# Patient Record
Sex: Female | Born: 1960
Health system: Southern US, Community
[De-identification: ages and names within clinical notes are randomized; demographics above are authoritative.]

## PROBLEM LIST (undated history)

## (undated) DIAGNOSIS — E039 Hypothyroidism, unspecified: Secondary | ICD-10-CM

## (undated) DIAGNOSIS — R112 Nausea with vomiting, unspecified: Secondary | ICD-10-CM

## (undated) DIAGNOSIS — Z9889 Other specified postprocedural states: Secondary | ICD-10-CM

## (undated) DIAGNOSIS — F419 Anxiety disorder, unspecified: Secondary | ICD-10-CM

## (undated) DIAGNOSIS — T4145XA Adverse effect of unspecified anesthetic, initial encounter: Secondary | ICD-10-CM

## (undated) DIAGNOSIS — I10 Essential (primary) hypertension: Secondary | ICD-10-CM

## (undated) DIAGNOSIS — T7840XA Allergy, unspecified, initial encounter: Secondary | ICD-10-CM

## (undated) DIAGNOSIS — F329 Major depressive disorder, single episode, unspecified: Secondary | ICD-10-CM

## (undated) DIAGNOSIS — M1990 Unspecified osteoarthritis, unspecified site: Secondary | ICD-10-CM

## (undated) DIAGNOSIS — Z8489 Family history of other specified conditions: Secondary | ICD-10-CM

## (undated) DIAGNOSIS — G4730 Sleep apnea, unspecified: Secondary | ICD-10-CM

## (undated) DIAGNOSIS — T8859XA Other complications of anesthesia, initial encounter: Secondary | ICD-10-CM

## (undated) DIAGNOSIS — F32A Depression, unspecified: Secondary | ICD-10-CM

## (undated) HISTORY — DX: Essential (primary) hypertension: I10

## (undated) HISTORY — DX: Sleep apnea, unspecified: G47.30

## (undated) HISTORY — DX: Allergy, unspecified, initial encounter: T78.40XA

## (undated) HISTORY — PX: VARICOSE VEIN SURGERY: SHX832

## (undated) HISTORY — PX: CHOLECYSTECTOMY: SHX55

## (undated) HISTORY — PX: OTHER SURGICAL HISTORY: SHX169

## (undated) HISTORY — PX: HERNIA REPAIR: SHX51

---

## 1997-07-07 ENCOUNTER — Ambulatory Visit: Admission: RE | Admit: 1997-07-07 | Discharge: 1997-07-07 | Payer: Self-pay | Admitting: Family Medicine

## 2000-05-19 ENCOUNTER — Encounter: Payer: Self-pay | Admitting: Family Medicine

## 2000-05-19 ENCOUNTER — Ambulatory Visit (HOSPITAL_COMMUNITY): Admission: RE | Admit: 2000-05-19 | Discharge: 2000-05-19 | Payer: Self-pay | Admitting: Family Medicine

## 2000-06-25 ENCOUNTER — Other Ambulatory Visit: Admission: RE | Admit: 2000-06-25 | Discharge: 2000-06-25 | Payer: Self-pay | Admitting: Obstetrics and Gynecology

## 2000-06-26 ENCOUNTER — Ambulatory Visit (HOSPITAL_COMMUNITY): Admission: RE | Admit: 2000-06-26 | Discharge: 2000-06-26 | Payer: Self-pay | Admitting: Obstetrics and Gynecology

## 2000-06-26 ENCOUNTER — Encounter: Payer: Self-pay | Admitting: Obstetrics and Gynecology

## 2000-09-16 ENCOUNTER — Encounter: Payer: Self-pay | Admitting: Obstetrics and Gynecology

## 2000-09-16 ENCOUNTER — Ambulatory Visit (HOSPITAL_COMMUNITY): Admission: RE | Admit: 2000-09-16 | Discharge: 2000-09-16 | Payer: Self-pay | Admitting: Obstetrics and Gynecology

## 2001-05-18 ENCOUNTER — Other Ambulatory Visit: Admission: RE | Admit: 2001-05-18 | Discharge: 2001-05-18 | Payer: Self-pay | Admitting: Obstetrics & Gynecology

## 2001-06-17 ENCOUNTER — Ambulatory Visit (HOSPITAL_COMMUNITY): Admission: RE | Admit: 2001-06-17 | Discharge: 2001-06-17 | Payer: Self-pay | Admitting: Obstetrics & Gynecology

## 2002-07-01 ENCOUNTER — Encounter: Payer: Self-pay | Admitting: Family Medicine

## 2002-07-01 ENCOUNTER — Ambulatory Visit (HOSPITAL_COMMUNITY): Admission: RE | Admit: 2002-07-01 | Discharge: 2002-07-01 | Payer: Self-pay | Admitting: Family Medicine

## 2003-09-30 ENCOUNTER — Emergency Department (HOSPITAL_COMMUNITY): Admission: EM | Admit: 2003-09-30 | Discharge: 2003-10-01 | Payer: Self-pay | Admitting: Emergency Medicine

## 2005-05-06 ENCOUNTER — Ambulatory Visit (HOSPITAL_COMMUNITY): Admission: RE | Admit: 2005-05-06 | Discharge: 2005-05-06 | Payer: Self-pay | Admitting: Family Medicine

## 2006-11-10 ENCOUNTER — Ambulatory Visit (HOSPITAL_COMMUNITY): Admission: RE | Admit: 2006-11-10 | Discharge: 2006-11-10 | Payer: Self-pay | Admitting: Family Medicine

## 2006-12-14 ENCOUNTER — Ambulatory Visit (HOSPITAL_COMMUNITY): Admission: RE | Admit: 2006-12-14 | Discharge: 2006-12-14 | Payer: Self-pay | Admitting: Family Medicine

## 2006-12-21 ENCOUNTER — Ambulatory Visit (HOSPITAL_COMMUNITY): Admission: RE | Admit: 2006-12-21 | Discharge: 2006-12-21 | Payer: Self-pay | Admitting: Family Medicine

## 2007-11-04 ENCOUNTER — Ambulatory Visit (HOSPITAL_COMMUNITY): Admission: RE | Admit: 2007-11-04 | Discharge: 2007-11-04 | Payer: Self-pay | Admitting: Family Medicine

## 2008-02-22 ENCOUNTER — Ambulatory Visit: Payer: Self-pay | Admitting: Cardiology

## 2008-02-22 ENCOUNTER — Emergency Department (HOSPITAL_COMMUNITY): Admission: EM | Admit: 2008-02-22 | Discharge: 2008-02-22 | Payer: Self-pay | Admitting: Emergency Medicine

## 2008-02-22 ENCOUNTER — Inpatient Hospital Stay (HOSPITAL_COMMUNITY): Admission: EM | Admit: 2008-02-22 | Discharge: 2008-02-24 | Payer: Self-pay | Admitting: Emergency Medicine

## 2008-02-23 ENCOUNTER — Ambulatory Visit: Payer: Self-pay | Admitting: Cardiology

## 2008-09-19 ENCOUNTER — Ambulatory Visit (HOSPITAL_COMMUNITY): Admission: RE | Admit: 2008-09-19 | Discharge: 2008-09-19 | Payer: Self-pay | Admitting: Family Medicine

## 2008-12-11 ENCOUNTER — Ambulatory Visit: Payer: Self-pay | Admitting: Orthopedic Surgery

## 2008-12-11 DIAGNOSIS — M7150 Other bursitis, not elsewhere classified, unspecified site: Secondary | ICD-10-CM | POA: Insufficient documentation

## 2009-01-03 ENCOUNTER — Encounter: Payer: Self-pay | Admitting: Orthopedic Surgery

## 2009-08-11 ENCOUNTER — Emergency Department (HOSPITAL_COMMUNITY): Admission: EM | Admit: 2009-08-11 | Discharge: 2009-08-11 | Payer: Self-pay | Admitting: Emergency Medicine

## 2010-04-18 ENCOUNTER — Ambulatory Visit (HOSPITAL_COMMUNITY): Payer: Self-pay | Admitting: Physical Therapy

## 2010-04-18 ENCOUNTER — Ambulatory Visit (HOSPITAL_COMMUNITY)
Admission: RE | Admit: 2010-04-18 | Discharge: 2010-04-18 | Disposition: A | Discharge status: Home / Self Care | Payer: 59 | Source: Ambulatory Visit | Attending: Family Medicine | Admitting: Family Medicine

## 2010-04-18 ENCOUNTER — Other Ambulatory Visit (HOSPITAL_COMMUNITY): Payer: Self-pay | Admitting: Family Medicine

## 2010-04-18 DIAGNOSIS — R0602 Shortness of breath: Secondary | ICD-10-CM | POA: Insufficient documentation

## 2010-04-21 LAB — CBC
HCT: 45.7 % (ref 36.0–46.0)
Hemoglobin: 15.6 g/dL — ABNORMAL HIGH (ref 12.0–15.0)
MCH: 29.5 pg (ref 26.0–34.0)
Platelets: 193 10*3/uL (ref 150–400)
RBC: 5.29 MIL/uL — ABNORMAL HIGH (ref 3.87–5.11)
RDW: 14.2 % (ref 11.5–15.5)
WBC: 9.6 10*3/uL (ref 4.0–10.5)

## 2010-04-21 LAB — BASIC METABOLIC PANEL
Calcium: 9.3 mg/dL (ref 8.4–10.5)
Creatinine, Ser: 0.67 mg/dL (ref 0.4–1.2)
GFR calc Af Amer: >60 mL/min (ref >60)
Potassium: 3.3 mEq/L — ABNORMAL LOW (ref 3.5–5.1)
Sodium: 140 mEq/L (ref 135–145)

## 2010-04-21 LAB — POCT CARDIAC MARKERS: CKMB, poc: <1 ng/mL — ABNORMAL LOW (ref 1.0–8.0)

## 2010-04-21 LAB — DIFFERENTIAL
Eosinophils Relative: 2 % (ref 0–5)
Neutro Abs: 5.1 10*3/uL (ref 1.7–7.7)
Neutrophils Relative %: 53 % (ref 43–77)

## 2010-04-22 ENCOUNTER — Ambulatory Visit (HOSPITAL_COMMUNITY): Payer: Self-pay | Admitting: Physical Therapy

## 2010-05-20 LAB — RAPID URINE DRUG SCREEN, HOSP PERFORMED
Amphetamines: NOT DETECTED
Barbiturates: NOT DETECTED
Benzodiazepines: POSITIVE — AB
Cocaine: NOT DETECTED
Opiates: NOT DETECTED

## 2010-05-20 LAB — COMPREHENSIVE METABOLIC PANEL
ALT: 25 U/L (ref 0–35)
AST: 19 U/L (ref 0–37)
BUN: 11 mg/dL (ref 6–23)
Calcium: 8.5 mg/dL (ref 8.4–10.5)
Calcium: 8.9 mg/dL (ref 8.4–10.5)
GFR calc Af Amer: >60 mL/min (ref >60)
Glucose, Bld: 102 mg/dL — ABNORMAL HIGH (ref 70–99)
Glucose, Bld: 124 mg/dL — ABNORMAL HIGH (ref 70–99)
Sodium: 140 mEq/L (ref 135–145)
Sodium: 142 mEq/L (ref 135–145)
Total Protein: 6 g/dL (ref 6.0–8.3)
Total Protein: 6.1 g/dL (ref 6.0–8.3)

## 2010-05-20 LAB — DIFFERENTIAL
Basophils Absolute: 0.1 10*3/uL (ref 0.0–0.1)
Eosinophils Absolute: 0.1 10*3/uL (ref 0.0–0.7)
Lymphocytes Relative: 36 % (ref 12–46)
Lymphs Abs: 2.4 10*3/uL (ref 0.7–4.0)
Lymphs Abs: 2.9 10*3/uL (ref 0.7–4.0)
Monocytes Relative: 7 % (ref 3–12)
Neutro Abs: 4.3 10*3/uL (ref 1.7–7.7)
Neutrophils Relative %: 59 % (ref 43–77)

## 2010-05-20 LAB — CBC
Hemoglobin: 14.6 g/dL (ref 12.0–15.0)
MCHC: 33 g/dL (ref 30.0–36.0)
Platelets: 171 10*3/uL (ref 150–400)
Platelets: 191 10*3/uL (ref 150–400)
RDW: 14.5 % (ref 11.5–15.5)
WBC: 7.9 10*3/uL (ref 4.0–10.5)

## 2010-05-20 LAB — PROTIME-INR: INR: 1.1 (ref 0.00–1.49)

## 2010-05-20 LAB — LIPID PANEL
Cholesterol: 160 mg/dL (ref 0–200)
Total CHOL/HDL Ratio: 6.4 RATIO
VLDL: 30 mg/dL (ref 0–40)

## 2010-05-20 LAB — GLUCOSE, CAPILLARY: Glucose-Capillary: 116 mg/dL — ABNORMAL HIGH (ref 70–99)

## 2010-05-20 LAB — HEMOGLOBIN A1C: Mean Plasma Glucose: 117 mg/dL

## 2010-05-20 LAB — POCT CARDIAC MARKERS
CKMB, poc: <1 ng/mL — ABNORMAL LOW (ref 1.0–8.0)
Myoglobin, poc: 62 ng/mL (ref 12–200)
Troponin i, poc: <0.05 ng/mL (ref 0.00–0.09)
Troponin i, poc: <0.05 ng/mL (ref 0.00–0.09)

## 2010-05-20 LAB — CARDIAC PANEL(CRET KIN+CKTOT+MB+TROPI)
Relative Index: INVALID (ref 0.0–2.5)
Total CK: 53 U/L (ref 7–177)

## 2010-05-20 LAB — APTT: aPTT: 32 seconds (ref 24–37)

## 2010-05-20 LAB — BASIC METABOLIC PANEL
BUN: 14 mg/dL (ref 6–23)
CO2: 23 mEq/L (ref 19–32)
Chloride: 109 mEq/L (ref 96–112)
Creatinine, Ser: 0.86 mg/dL (ref 0.4–1.2)
GFR calc Af Amer: >60 mL/min (ref >60)
Sodium: 141 mEq/L (ref 135–145)

## 2010-05-20 LAB — BRAIN NATRIURETIC PEPTIDE: Pro B Natriuretic peptide (BNP): <30 pg/mL (ref 0.0–100.0)

## 2010-06-07 ENCOUNTER — Emergency Department (HOSPITAL_COMMUNITY)
Admission: EM | Admit: 2010-06-07 | Discharge: 2010-06-07 | Disposition: A | Discharge status: Home / Self Care | Payer: 59 | Attending: Emergency Medicine | Admitting: Emergency Medicine

## 2010-06-07 ENCOUNTER — Ambulatory Visit (HOSPITAL_COMMUNITY)
Admission: RE | Admit: 2010-06-07 | Discharge: 2010-06-07 | Disposition: A | Discharge status: Home / Self Care | Payer: 59 | Source: Ambulatory Visit | Attending: Family Medicine | Admitting: Family Medicine

## 2010-06-07 ENCOUNTER — Other Ambulatory Visit: Payer: Self-pay | Admitting: Family Medicine

## 2010-06-07 DIAGNOSIS — K5730 Diverticulosis of large intestine without perforation or abscess without bleeding: Secondary | ICD-10-CM | POA: Insufficient documentation

## 2010-06-07 DIAGNOSIS — K921 Melena: Secondary | ICD-10-CM | POA: Insufficient documentation

## 2010-06-07 DIAGNOSIS — R109 Unspecified abdominal pain: Secondary | ICD-10-CM | POA: Insufficient documentation

## 2010-06-07 DIAGNOSIS — K625 Hemorrhage of anus and rectum: Secondary | ICD-10-CM | POA: Insufficient documentation

## 2010-06-07 DIAGNOSIS — K7689 Other specified diseases of liver: Secondary | ICD-10-CM | POA: Insufficient documentation

## 2010-06-07 LAB — CBC
MCH: 29.2 pg (ref 26.0–34.0)
MCHC: 33.6 g/dL (ref 30.0–36.0)
MCV: 86.9 fL (ref 78.0–100.0)
Platelets: 197 10*3/uL (ref 150–400)
RDW: 14.2 % (ref 11.5–15.5)

## 2010-06-07 LAB — DIFFERENTIAL
Eosinophils Absolute: 0.2 10*3/uL (ref 0.0–0.7)
Eosinophils Relative: 2 % (ref 0–5)
Lymphs Abs: 3.4 10*3/uL (ref 0.7–4.0)
Monocytes Absolute: 0.7 10*3/uL (ref 0.1–1.0)
Monocytes Relative: 8 % (ref 3–12)

## 2010-06-07 LAB — COMPREHENSIVE METABOLIC PANEL
Albumin: 3.8 g/dL (ref 3.5–5.2)
BUN: 17 mg/dL (ref 6–23)
Creatinine, Ser: 0.81 mg/dL (ref 0.4–1.2)
Total Bilirubin: 0.3 mg/dL (ref 0.3–1.2)
Total Protein: 7.2 g/dL (ref 6.0–8.3)

## 2010-06-07 LAB — URINALYSIS, ROUTINE W REFLEX MICROSCOPIC
Glucose, UA: NEGATIVE mg/dL
Leukocytes, UA: NEGATIVE
Specific Gravity, Urine: <1.005 — ABNORMAL LOW (ref 1.005–1.030)
Urobilinogen, UA: 0.2 mg/dL (ref 0.0–1.0)

## 2010-06-07 LAB — URINE MICROSCOPIC-ADD ON

## 2010-06-07 LAB — TYPE AND SCREEN

## 2010-06-07 MED ORDER — IOHEXOL 300 MG/ML  SOLN
100.0000 mL | Freq: Once | INTRAMUSCULAR | Status: AC | PRN
  Administered 2010-06-07: 100 mL via INTRAVENOUS

## 2010-06-18 NOTE — Cardiovascular Report (Signed)
Amy Hunter, Amy Hunter              ACCOUNT NO.:  [id number]   MEDICAL RECORD NO.:  [id number]          PATIENT TYPE:  INP   LOCATION:  4705                         FACILITY:  [facility]   PHYSICIAN:  Everardo Beals. Juanda Chance, MD, FACCDATE OF BIRTH:  [birth year]   DATE OF PROCEDURE:  02/23/2008  DATE OF DISCHARGE:                            CARDIAC CATHETERIZATION   CLINICAL HISTORY:  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.  She recently was admitted with chest  pain and ruled out for myocardial infarction.  She had a CT angio, which  showed no pulmonary embolus.  Because of recurrent symptoms, she was  seen in consultation by Dr. Diona Browner who along with Dr. Gerda Diss  recommended further evaluation with cardiac catheterization.   PROCEDURE:  The procedure was performed of the right femoral artery,  arterial sheath, and 5-French preformed coronary catheters.  A front  wall arterial puncture was performed, and Omnipaque contrast was used.  The patient tolerated the procedure and left the laboratory in  satisfactory condition.   RESULTS:  Left main coronary artery:  The left main coronary artery was  free of significant disease.   Left anterior descending artery:  The left anterior descending artery  gave rise to septal perforator, moderate-sized diagonal branch, and  several more small septal perforators, and diagonal branches.  This  vessel was free of significant obstruction, but had TIMI 2 flow and it  took 4-5 cycles to feel to the apex.   The circumflex artery:  The circumflex artery gave rise to a small ramus  branch, small marginal branch, and 2 large posterolateral branches.  These vessels were free of significant disease.   The right coronary artery:  The right coronary artery was a moderate-  sized vessel gave rise to a conus branch, right ventricular branch,  posterior descending branch, and a posterolateral branch.  These vessels  were free of  significant disease.   The left ventriculogram:  The left ventriculogram performed in the RAO  projection showed good wall motion with no areas of hypokinesis.  The  estimated ejection fraction was 60%.   The aortic pressure was 103/71 with mean of 82 and left ventricular  pressure was 103/22.   CONCLUSION:  Normal coronary angiography and left ventricular wall  motion.   RECOMMENDATIONS:  Reassurance.  The patient does have some slow flow  down the LAD, but the clinical significance of this is uncertain.  We  will plan reassurance.      Bruce Elvera Lennox Juanda Chance, MD, [facility]  Electronically Signed     BRB/MEDQ  D:  02/23/2008  T:  02/24/2008  Job:  [id number]   cc:   Jonelle Sidle, MD  [facility]  Scott A. Gerda Diss, MD

## 2010-06-18 NOTE — H&P (Signed)
Amy Hunter, DOUGAN              ACCOUNT NO.:  [id number]   MEDICAL RECORD NO.:  [id number]          PATIENT TYPE:  INP   LOCATION:  A327                          FACILITY:  [facility]   PHYSICIAN:  Donalynn Furlong, MD      DATE OF BIRTH:  1960-04-16   DATE OF ADMISSION:  02/22/2008  DATE OF DISCHARGE:  LH                              HISTORY & PHYSICAL   ADMITTING PHYSICIAN:  Dr. Lilian Kapur.   PRIMARY CARE PHYSICIAN:  Scott A. Luking, M.D.   CHIEF COMPLAINT:  Chest pain, shortness of breath, panic attack.   HISTORY OF PRESENT ILLNESS:  Ms. Kotara is a 50 year old Caucasian  female.  She presented to [facility] ER tonight with a  complaint of chest pain starting at around 6 p.m. tonight, which lasted  for about a couple of hours until she arrived in the ER.  She also had  the similar symptoms last night, and she was here in the ER.  She had  one set of troponin done along with D-dimer negative.  She was diagnosed  with anxiety and sent home.  She saw her primary care Harrell Niehoff, Dr.  Lilyan Punt, also today morning.  She was prescribed antidepressant and  Xanax for the possible anxiety and panic attacks.  She suffered  recurrent attack today evening again that led to her ER admission.  She  mentions that her chest pain is located on the right side of her chest  bone without any radiation.  The chest pain is aggravated by touch and  palpation of the area.  The chest pain is nonradiating with quality of  dull ache.  She denied any history of coronary artery disease, PE or  clots in the legs, gastroesophageal reflux disease, pneumonia in the  past.  She denied any sick contacts also.  She is trying to quit smoking  and has been using nicotine patches for the last few days.  She thinks  it may be due to the nicotine patches.  She also mentions that she has a  history of panic attacks, but previous panic attacks are not this  severe.   PAST MEDICAL HISTORY:  1. Cholecystectomy.  2.  Obesity.  3. Tobacco use.  4. Diverticulosis.  5. Anxiety.  6. Panic attack.   PAST SURGICAL HISTORY:  1. Cholecystectomy.  2. Endoscopic endometrial ablation.   ALLERGIES:  None.   HOME MEDICATIONS:  Just started today on antidepressant and Xanax by her  PCP but no other previous medication.   FAMILY HISTORY:  No premature coronary artery disease history.   SOCIAL HISTORY:  She is a smoker for around 15-20 years.  No distant  alcohol or drug use.   REVIEW OF SYSTEMS:  Positive as per HPI, otherwise negative review of  systems done for 14 systems.   PHYSICAL EXAMINATION:  VITAL SIGNS:  Blood pressure 112/56, pulse 105,  respirations 18, temperature 97.7, oxygen saturation normal on room air.  GENERAL:  Alert, oriented, lying in bed without any acute distress.  CARDIOVASCULAR:  S1 and S2, regular.  No murmur, rub or gallop.  LUNGS:  Clear to auscultation bilaterally.  No wheezing, rhonchi,  crackles.  Patient's body habitus does impair the lung exam.  ABDOMEN:  Patient does have epigastric tenderness and some tenderness is  also located over the right side of the chest wall on palpation.  No  hepatosplenomegaly.  No rebound tenderness or rigidity noted.  EXTREMITIES:  No clubbing, cyanosis or edema.  Pulse felt to be in all 4  extremities.  Head is normocephalic and atraumatic.  Pupils are equal, round and  reactive to light and accommodation.  Extraocular movements are intact.  Oral cavity:  Oral mucous membranes are moist.  No thrush noted.  NECK:  No thyromegaly or JVD.  SKIN:  No rash or bruits.  NEUROLOGIC:  Intact cranial nerves, muscular strength, sensation, and  reflexes.   One set of cardiac enzymes are negative.  CBC from this morning and  yesterday night in the hospital workup is unremarkable.  BMP shows  hypokalemia from last night.   CT chest, PE protocol, is pending.   EKG unremarkable for any acute ST/T changes.   ASSESSMENT:  1. Atypical chest pain  with reproducible on nature by palpation.      Cannot exclude pulmonary embolus, gastroesophageal reflux disease.      Very less likely to be coronary artery disease.  2. Hypokalemia  3. Obesity.  4. Anxiety/panic attacks.  5. History of cholecystectomy  6. Diverticulosis  7. Tobacco use   PLAN:  We will admit the patient on telemetry bed under Dr. Lilian Kapur  with admitting diagnosis of chest pain, hypokalemia, obesity, and  tobacco use with anxiety.  Patient will be on telemetry bed.  Will check  vital signs q.6h.  Will also check strict I's and O's as per protocol.  Will give oxygen saturation more than 90% with the help of oxygen if  needed.  Patient will be n.p.o. except for medicines, ice chips, and  water for the echocardiogram and exercise Cardiolite stress test in the  morning.  Both tests will be read by [facility].  Will  start IV normal saline at 50 cc/hr along with 40 mEq of potassium  chloride for hydration and hypokalemia replacement.  Will check CMP,  BNP, and urine drug screen now.  Will check CBC, fasting lipids, and EKG  in the morning.  Will get 3 sets of cardiac enzymes with troponin to  rule out MI.  CT chest, PE protocol, has been ordered by ER physician.  Patient could not complete the procedure because of claustrophobia.  Patient will receive Ativan before the procedure repeat today.  Further  plan according to the workup pending.  Will also start aspirin 325 mg  p.o. now along with metoprolol 12.5 mg p.o. b.i.d., simvastatin 40 mg,  lisinopril 10 mg p.o. daily, nitropaste q6hr and  nitroglycerin tablets as needed.  Will also provide IV morphine for  chest pain, Tylenol for fever or chest pain, IV Phenergan for nausea and  vomiting as needed.  Will also provide Ambien for sleep as needed.  Patient will also continue to take Xanax 1 mg p.o. q.8h. p.r.n. We will  also start enoxaparin to cover for possible MI or PE.      Donalynn Furlong, MD   Electronically Signed     TVP/MEDQ  D:  02/22/2008  T:  02/23/2008  Job:  [id number]   cc:   Lorin Picket A. Gerda Diss, MD  Fax: [phone]   Skeet Latch, DO

## 2010-06-18 NOTE — Consult Note (Signed)
NAMESHAQUAN, PUERTA              ACCOUNT NO.:  [id number]   MEDICAL RECORD NO.:  [id number]          PATIENT TYPE:  INP   LOCATION:  A327                          FACILITY:  [facility]   PHYSICIAN:  Jonelle Sidle, MD DATE OF BIRTH:  1960/12/15   DATE OF CONSULTATION:  02/23/2008  DATE OF DISCHARGE:                                 CONSULTATION   PRIMARY CARE PHYSICIAN:  Scott A. Gerda Diss, M.D.   REASON FOR CONSULTATION:  Recurrent chest pain.   HISTORY OF PRESENT ILLNESS:  Ms. Chilton is a morbidly obese 50 year old  woman with a history of tobacco abuse and anxiety.  She has recently  been trying to quit smoking over the last week and has been using a  nicotine patch, also admitting to worsening stress in this situation.  She has had recurrent episodes of chest pressure that have occurred  suddenly at rest and lasted for several minutes to even hours at a time  over the last 48-72 hours.  This has prompted two emergency department  visits, as well as an office visit with Dr. Gerda Diss yesterday.  She has  had normal cardiac markers during these presentations, as well as a  normal D-dimer level, and was initially managed for the possibility of  anxiety and depression, being placed on both Celexa and Xanax just  yesterday by Dr. Gerda Diss.  She had a more intense episode of chest  pressure yesterday and is now admitted to the hospital for further  evaluation by the hospitalist service.  She was referred for a CT scan  of the chest done yesterday and demonstrating no evidence of pulmonary  embolus or acute thoracic process.  Her cardiac markers have again been  normal.  She does state that her symptoms may have improved somewhat  after receiving nitroglycerin in the emergency department.  At the  present time, she is not having any active chest pain.  The urine drug  screen is noncontributory, and her follow-up electrocardiogram shows  normal sinus rhythm at 89 beats per minute.  Dr. Gerda Diss  asked me to see  Ms. Wenk today to specifically discuss the possibility of proceeding  with a diagnostic cardiac catheterization to most clearly define the  patient's coronary anatomy.  The possibility of artifact affecting a  noninvasive study would be fairly significant, given the patient's body  habitus.  I had a discussion with the patient, her husband and her  daughter regarding the risk and benefit profile of a diagnostic cardiac  catheterization including, but not limited to, the risks of death,  bleeding, stroke and vascular complication.  She is in agreement to  proceed for definitive evaluation.   ALLERGIES:  NO KNOWN DRUG ALLERGIES.   MEDICATIONS:  Medications most recently at home include:  1. Celexa 10 mg p.o. daily.  2. Xanax 0.5 mg p.o. t.i.d. p.r.n.   She has also been placed on:  1. Lisinopril 10 mg p.o. daily.  2. Lopressor 12.5 mg p.o. b.i.d.  3. Zocor 40 mg p.o. q.h.s.  4. Aspirin 325 mg p.o. daily.  5. Lovenox 140 mg q.12h. (a.m. dose  not yet given).   PAST MEDICAL HISTORY:  Outlined above.  Additional problems include:  1. Diverticulosis.  2. Possibly panic attacks.  3. She has undergone cholecystectomy.  4. She has undergone endoscopic endometrial ablation.   FAMILY HISTORY:  Reviewed and is noncontributory for premature  cardiovascular disease.   SOCIAL HISTORY:  The patient has a longstanding tobacco abuse history of  15-20 years.  She is been trying to quit just recently.  No significant  alcohol or illicit substance use.   REVIEW OF SYSTEMS:  As outlined above.  She has had no cough or  hemoptysis.  No fevers or chills.  No palpitations or syncope.  There is  NYHA class II dyspnea on exertion.  Otherwise negative.   PHYSICAL EXAMINATION:  VITAL SIGNS:  Temperature is 97.9 degrees, heart  rate is 85 and regular, respirations 20.  Systolic blood pressure  ranging from 88-131, diastolic of 53-76.  Oxygen saturation is 90-94% on  2 liters nasal  cannula.  Weight is 135.6 kg.  GENERAL:  This is a morbidly obese woman in no acute distress without  active chest pain.  HEENT:  Conjunctivae is normal.  Oropharynx is clear.  NECK:  Supple.  No elevated venous pressure.  No loud carotid bruits.  No thyromegaly is noted.  LUNGS:  Clear with diminished breath sounds.  CARDIAC:  Distant regular heart sounds.  No pathologic systolic murmur.  No pericardial rub or S3 gallop is obvious.  ABDOMEN:  Morbidly obese.  Unable to adequately palpate for liver edge.  Bowel sounds are present.  There is no guarding.  EXTREMITIES:  Trace symmetrical lower extremity edema.  Distal pulses of  1 to 2+.  SKIN:  Warm and dry.  MUSCULOSKELETAL:  No kyphosis noted.  NEUROPSYCHIATRIC:  The patient is alert and oriented x3.  Affect is  appropriate.   LABORATORY DATA:  WBCs 7.8, hemoglobin 13.2, hematocrit 40.0, platelets  191.  Sodium 140, potassium 3.9, chloride 108, bicarbonate 28, glucose  124, BUN 14, creatinine 0.7, CK 53, CK-MB 1.6, troponin-I of 0.01.  BNP  less than 30.  Urine drug screen significant for only benzodiazepines.   Recent CT angiogram of the chest revealed no evidence of pulmonary  embolus or acute thoracic process.  Prominent epicardial fat pad is  noted.  No aortic dissection or aneurysm.   IMPRESSION:  1. Recurrent chest pressure of recent onset and predominantly at      rest in a morbidly obese 49 year old woman with longstanding      tobacco abuse history.  She has recently tried to quit smoking and      has been using nicotine patches, also reporting increased anxiety      and stress in this situation.  She has no clear evidence of      pulmonary embolus or acute aortic pathology to explain her      symptoms.  Cardiac markers are also very reassuring with normal      electrocardiogram at this time in the setting of no active chest      pain.  Dr. Gerda Diss remains very concerned about Ms. Pratt's      symptoms and has  requested consideration for a diagnostic cardiac      catheterization in the event that her symptoms represent new onset      unstable angina.  The risk of false positive or artifact in the      setting of a noninvasive study would be significant.  2.  Morbid obesity.  3. Reported history of anxiety and panic attacks.   RECOMMENDATIONS:  I discussed the situation in detail with the patient,  her husband and her daughter, reviewing both the risks and benefits of a  diagnostic cardiac catheterization to best understand her coronary  anatomy.  She is in agreement to proceed, and we will therefore plan  transfer to [facility], hopefully in anticipation of a  diagnostic procedure later today.  Lovenox will be held this morning.  If her coronary anatomy is reassuring, she can likely be discharged and  continue regular follow up with Dr. Gerda Diss.  Further plans to follow  pending evaluation by our [facility] service.      Jonelle Sidle, MD  Electronically Signed     SGM/MEDQ  D:  02/23/2008  T:  02/23/2008  Job:  [id number]

## 2010-06-18 NOTE — Discharge Summary (Signed)
Amy Hunter, Amy Hunter              ACCOUNT NO.:  [id number]   MEDICAL RECORD NO.:  [id number]          PATIENT TYPE:  INP   LOCATION:  4705                         FACILITY:  [facility]   PHYSICIAN:  Everardo Beals. Juanda Chance, MD, FACCDATE OF BIRTH:  1960/04/19   DATE OF ADMISSION:  02/23/2008  DATE OF DISCHARGE:  02/24/2008                               DISCHARGE SUMMARY   PROCEDURES:  1. Cardiac catheterization.  2. Coronary arteriogram.  3. Left ventriculogram.   PRIMARY FINAL DISCHARGE DIAGNOSIS:  Noncardiac chest pain.   SECONDARY DIAGNOSES:  1. Morbid obesity.  2. Tobacco abuse.  3. Anxiety.  4. Diverticulosis.  5. Status post cholecystectomy and endoscopic endometrial ablation.   TIME OF DISCHARGE:  33 minutes.   HOSPITAL COURSE:  [name] is a 50 year old female with no previous  history of coronary artery disease.  She went to [facility] on  February 22, 2008 for chest pain and shortness of breath.  She also  possibly has anxiety attacks.  She was seen in consultation by Dr.  Diona Browner who felt cardiac catheterization was indicated.  She was  transferred to [facility] for this on February 23, 2008.   The cardiac catheterization showed no coronary artery disease.  The  distal LAD had TIMI II flow and it took 5 cycles to fill to the apex,  but there was no significant stenosis.  Her EF was 60%.  Dr. Juanda Chance  evaluated the films and felt the clinical significance of the slow flow  down the LAD was uncertain.  He felt reassurance was the best option.   On February 24, 2008, [name] had had decreased O2 saturations  overnight, sats dropping occasionally 90%.  It was reported that she has  obstructive sleep apnea, but is not on CPAP.  Dr. Juanda Chance requested that  she discuss this with Dr. Gerda Diss as she may need a repeat sleep study.  Dr. Juanda Chance evaluated [name].  Her chest pain had resolved.  Her  cath site was stable.  He felt that she could be safely discharged home  and follow up with Internal Medicine in 1-2 weeks and Cardiology on a  p.r.n. basis.   DISCHARGE INSTRUCTIONS:  1. Her activity level is to be increased gradually with no lifting for      week and no driving for 2 days.  2. She is to call our office for problems with cath site.  3. She is encouraged to stick to a low-sodium heart-healthy diet.  4. She is to follow up with Dr. Diona Browner in [place] as needed.  She      is to follow up with Dr. Gerda Diss in 10 days to 2 weeks.   DISCHARGE MEDICATIONS:  1. Xanax 0.5 mg while at home.  2. Celexa 20 mg 1/2 tablet daily.  3. Aspirin 81 mg daily.  4. Metoprolol ER 25 mg daily.      Theodore Demark, PA-C      Bruce R. Juanda Chance, MD, [facility]  Electronically Signed    RB/MEDQ  D:  02/24/2008  T:  02/24/2008  Job:  [id number]  cc:   Scott A. Gerda Diss, MD

## 2010-06-21 NOTE — Op Note (Signed)
[facility]  Patient:    Amy Hunter, Amy Hunter Visit Number: [id number] MRN: [id number]          Service Type: DSU Location: DAY Attending Physician:  Lazaro Arms Dictated by:   Duane Lope, M.D. Proc. Date: 06/17/01 Admit Date:  06/17/2001 Discharge Date: 06/17/2001                             Operative Report  PREOPERATIVE DIAGNOSES:  1. Dysfunctional uterine bleeding.  2. Anemia.  POSTOPERATIVE DIAGNOSES:  1. Dysfunctional uterine bleeding.  2. Anemia.  PROCEDURE:  Hysteroscopy D&C with a thermal endometrial oblation.  SURGEON:  Duane Lope, M.D.  ANESTHESIA:  General endotracheal.  FINDINGS:  The patient had maybe what was a small polyp in the uterus otherwise some shaggy endometrium, certainly there is no submucosal myoma and there is no concern about any areas of malignancy.  DESCRIPTION OF PROCEDURE:  The patient was taken to the operating room and placed in the supine position where she underwent general endotracheal anesthesia. She was then placed in the dorsal lithotomy position, her bladder was drained, and she was draped in the usual sterile fashion. A sterile speculum was placed. Her cervix was grasped with a single tooth tenaculum. A paracervical block using 0.5% Marcaine plain was placed, 10 cc on either side and 5 cc were placed on either side around the uterosacral ligament area. The cervix was dilated serially to allow passage of a 5 mm hysteroscope. Hysteroscopy was performed and the above noted findings seen. Pictures were taken. The cervix was then dilated further and a sharp curettage was performed. Good uterine cry was obtained in all areas. In the area where there was a polyp, I think that I was able to remove that tissue completely. Again uterine cry in all areas. I then turned my attention to the endometrial oblation. The endometrial oblation catheter was primed, taken to a negative pressure of 150. A 30 cc syringe  with D-5-W was then placed at the area of the trumpet and it was placed in the uterine cavity at a depth of 9 cm. The trumpet valve was then depressed and 13 cc of D-5-W was placed to maintain a pressure of 180 mmHg. This was maintained for 45 seconds. I then turned the thermal unit on and had a warm up time of 1 minute and 9 seconds and a treatment time of 8 minutes for a total treatment time of 9 minutes and 9 seconds. It was heated to 87 degrees Celsius and the propeller in the catheter was working the whole time. It was then allowed to cool down, the bulb was deflated and in fact there was no leakage and removed from the uterus. A single tooth tenaculum was removed and the speculum was removed. The patient tolerated the procedure well. She experienced minimal blood loss and was taken to the recovery room in good stable condition. All counts were correct x3. Dictated by:   Duane Lope, M.D. Attending Physician:  Lazaro Arms DD:  06/17/01 TD:  06/21/01 Job: [id number]

## 2010-06-25 ENCOUNTER — Other Ambulatory Visit: Payer: Self-pay | Admitting: Family Medicine

## 2010-06-25 DIAGNOSIS — Z139 Encounter for screening, unspecified: Secondary | ICD-10-CM

## 2010-07-02 ENCOUNTER — Ambulatory Visit (HOSPITAL_COMMUNITY)
Admission: RE | Admit: 2010-07-02 | Discharge: 2010-07-02 | Disposition: A | Discharge status: Home / Self Care | Payer: 59 | Source: Ambulatory Visit | Attending: Family Medicine | Admitting: Family Medicine

## 2010-07-02 DIAGNOSIS — Z139 Encounter for screening, unspecified: Secondary | ICD-10-CM

## 2010-07-02 DIAGNOSIS — Z1231 Encounter for screening mammogram for malignant neoplasm of breast: Secondary | ICD-10-CM | POA: Insufficient documentation

## 2010-08-07 ENCOUNTER — Emergency Department (HOSPITAL_COMMUNITY): Payer: 59

## 2010-08-07 ENCOUNTER — Emergency Department (HOSPITAL_COMMUNITY)
Admission: EM | Admit: 2010-08-07 | Discharge: 2010-08-07 | Disposition: A | Discharge status: Home / Self Care | Payer: 59 | Attending: Emergency Medicine | Admitting: Emergency Medicine

## 2010-08-07 DIAGNOSIS — I10 Essential (primary) hypertension: Secondary | ICD-10-CM | POA: Insufficient documentation

## 2010-08-07 DIAGNOSIS — R079 Chest pain, unspecified: Secondary | ICD-10-CM | POA: Insufficient documentation

## 2010-08-07 DIAGNOSIS — M79609 Pain in unspecified limb: Secondary | ICD-10-CM | POA: Insufficient documentation

## 2010-08-07 DIAGNOSIS — Z79899 Other long term (current) drug therapy: Secondary | ICD-10-CM | POA: Insufficient documentation

## 2010-08-07 DIAGNOSIS — F411 Generalized anxiety disorder: Secondary | ICD-10-CM | POA: Insufficient documentation

## 2010-08-07 DIAGNOSIS — F410 Panic disorder [episodic paroxysmal anxiety] without agoraphobia: Secondary | ICD-10-CM | POA: Insufficient documentation

## 2010-08-07 LAB — DIFFERENTIAL
Basophils Absolute: 0.1 10*3/uL (ref 0.0–0.1)
Lymphocytes Relative: 31 % (ref 12–46)
Monocytes Absolute: 0.9 10*3/uL (ref 0.1–1.0)
Neutro Abs: 6.4 10*3/uL (ref 1.7–7.7)

## 2010-08-07 LAB — CBC
HCT: 45.8 % (ref 36.0–46.0)
Hemoglobin: 15.1 g/dL — ABNORMAL HIGH (ref 12.0–15.0)
MCHC: 33 g/dL (ref 30.0–36.0)
RBC: 5.16 MIL/uL — ABNORMAL HIGH (ref 3.87–5.11)
WBC: 11.1 10*3/uL — ABNORMAL HIGH (ref 4.0–10.5)

## 2010-08-07 LAB — CK TOTAL AND CKMB (NOT AT ARMC)
CK, MB: 1.8 ng/mL (ref 0.3–4.0)
CK, MB: 1.6 ng/mL (ref 0.3–4.0)
Relative Index: INVALID (ref 0.0–2.5)
Total CK: 49 U/L (ref 7–177)

## 2010-08-07 LAB — BASIC METABOLIC PANEL
Calcium: 9 mg/dL (ref 8.4–10.5)
GFR calc Af Amer: >60 mL/min (ref >60)
GFR calc non Af Amer: >60 mL/min (ref >60)
Sodium: 141 mEq/L (ref 135–145)

## 2010-08-07 LAB — D-DIMER, QUANTITATIVE: D-Dimer, Quant: 0.41 ug/mL-FEU (ref 0.00–0.48)

## 2010-08-07 LAB — TROPONIN I: Troponin I: <0.3 ng/mL (ref <0.30)

## 2010-08-08 ENCOUNTER — Ambulatory Visit (HOSPITAL_COMMUNITY)
Admission: RE | Admit: 2010-08-08 | Discharge: 2010-08-08 | Disposition: A | Discharge status: Home / Self Care | Payer: 59 | Source: Ambulatory Visit | Attending: Family Medicine | Admitting: Family Medicine

## 2010-08-08 ENCOUNTER — Other Ambulatory Visit: Payer: Self-pay | Admitting: Family Medicine

## 2010-08-08 DIAGNOSIS — M542 Cervicalgia: Secondary | ICD-10-CM | POA: Insufficient documentation

## 2010-08-08 DIAGNOSIS — M792 Neuralgia and neuritis, unspecified: Secondary | ICD-10-CM

## 2010-08-08 DIAGNOSIS — M47812 Spondylosis without myelopathy or radiculopathy, cervical region: Secondary | ICD-10-CM | POA: Insufficient documentation

## 2010-08-08 DIAGNOSIS — M79609 Pain in unspecified limb: Secondary | ICD-10-CM | POA: Insufficient documentation

## 2010-08-28 ENCOUNTER — Ambulatory Visit (HOSPITAL_COMMUNITY)
Admission: RE | Admit: 2010-08-28 | Discharge: 2010-08-28 | Disposition: A | Discharge status: Home / Self Care | Payer: 59 | Source: Ambulatory Visit | Attending: Family Medicine | Admitting: Family Medicine

## 2010-08-28 ENCOUNTER — Other Ambulatory Visit: Payer: Self-pay | Admitting: Family Medicine

## 2010-08-28 DIAGNOSIS — R609 Edema, unspecified: Secondary | ICD-10-CM

## 2010-08-28 DIAGNOSIS — R52 Pain, unspecified: Secondary | ICD-10-CM

## 2010-08-28 DIAGNOSIS — I82409 Acute embolism and thrombosis of unspecified deep veins of unspecified lower extremity: Secondary | ICD-10-CM | POA: Insufficient documentation

## 2010-08-29 ENCOUNTER — Ambulatory Visit (HOSPITAL_COMMUNITY)
Admission: RE | Admit: 2010-08-29 | Discharge: 2010-08-29 | Disposition: A | Discharge status: Home / Self Care | Payer: 59 | Source: Ambulatory Visit | Attending: Family Medicine | Admitting: Family Medicine

## 2010-08-29 DIAGNOSIS — R52 Pain, unspecified: Secondary | ICD-10-CM

## 2010-08-29 DIAGNOSIS — R609 Edema, unspecified: Secondary | ICD-10-CM

## 2010-08-29 DIAGNOSIS — M7989 Other specified soft tissue disorders: Secondary | ICD-10-CM | POA: Insufficient documentation

## 2010-08-29 DIAGNOSIS — M79609 Pain in unspecified limb: Secondary | ICD-10-CM | POA: Insufficient documentation

## 2010-09-03 MED FILL — Enoxaparin Sodium Inj 120 MG/0.8ML: SUBCUTANEOUS | Qty: 0.8 | Status: AC

## 2011-07-22 ENCOUNTER — Other Ambulatory Visit: Payer: Self-pay | Admitting: Family Medicine

## 2011-07-22 DIAGNOSIS — Z139 Encounter for screening, unspecified: Secondary | ICD-10-CM

## 2011-07-28 ENCOUNTER — Ambulatory Visit (HOSPITAL_COMMUNITY)
Admission: RE | Admit: 2011-07-28 | Discharge: 2011-07-28 | Disposition: A | Discharge status: Home / Self Care | Payer: 59 | Source: Ambulatory Visit | Attending: Family Medicine | Admitting: Family Medicine

## 2011-07-28 DIAGNOSIS — Z1231 Encounter for screening mammogram for malignant neoplasm of breast: Secondary | ICD-10-CM | POA: Insufficient documentation

## 2011-07-28 DIAGNOSIS — Z139 Encounter for screening, unspecified: Secondary | ICD-10-CM

## 2011-08-13 ENCOUNTER — Other Ambulatory Visit: Payer: Self-pay

## 2011-08-13 DIAGNOSIS — I872 Venous insufficiency (chronic) (peripheral): Secondary | ICD-10-CM

## 2011-09-15 ENCOUNTER — Encounter: Payer: Self-pay | Admitting: Vascular Surgery

## 2011-09-16 ENCOUNTER — Ambulatory Visit (INDEPENDENT_AMBULATORY_CARE_PROVIDER_SITE_OTHER): Payer: 59 | Admitting: Vascular Surgery

## 2011-09-16 ENCOUNTER — Encounter (INDEPENDENT_AMBULATORY_CARE_PROVIDER_SITE_OTHER): Payer: 59 | Admitting: *Deleted

## 2011-09-16 ENCOUNTER — Encounter: Payer: Self-pay | Admitting: Vascular Surgery

## 2011-09-16 ENCOUNTER — Other Ambulatory Visit: Payer: Self-pay | Admitting: *Deleted

## 2011-09-16 VITALS — BP 139/86 | HR 102 | Resp 24 | Ht 65.0 in | Wt 335.0 lb

## 2011-09-16 DIAGNOSIS — I83893 Varicose veins of bilateral lower extremities with other complications: Secondary | ICD-10-CM

## 2011-09-16 DIAGNOSIS — M7989 Other specified soft tissue disorders: Secondary | ICD-10-CM

## 2011-09-16 DIAGNOSIS — I872 Venous insufficiency (chronic) (peripheral): Secondary | ICD-10-CM | POA: Insufficient documentation

## 2011-09-16 NOTE — Progress Notes (Signed)
Subjective:     Patient ID: Amy Hunter, female   DOB: 01-23-1961, 51 y.o.   MRN: [id number]  HPI this 51 year old female with morbid obesity was referred for severe swelling in both lower extremities which is worsened over the past 12 months. She has no history of stasis ulcers, bleeding, thrombophlebitis, DVT. She does have aching throbbing and burning discomfort in both legs as the day progresses. Wearing elastic compression stockings is difficult because of the size of her legs she has tried this.  Past Medical History  Diagnosis Date  . Diabetes mellitus   . Hypertension     History  Substance Use Topics  . Smoking status: Former Games developer  . Smokeless tobacco: Not on file  . Alcohol Use: No    History reviewed. No pertinent family history.  No Known Allergies  Current outpatient prescriptions:aspirin 81 MG tablet, Take 81 mg by mouth daily., Disp: , Rfl: ;  levothyroxine (SYNTHROID, LEVOTHROID) 200 MCG tablet, Take 200 mcg by mouth daily. Take on Monday and 200 rest of week, Disp: , Rfl: ;  metFORMIN (GLUCOPHAGE) 500 MG tablet, Take 500 mg by mouth 2 (two) times daily with a meal., Disp: , Rfl: ;  metoprolol succinate (TOPROL-XL) 25 MG 24 hr tablet, Take 25 mg by mouth daily., Disp: , Rfl:  potassium chloride (KLOR-CON) 20 MEQ packet, Take 20 mEq by mouth 2 (two) times daily., Disp: , Rfl: ;  torsemide (DEMADEX) 20 MG tablet, Take 20 mg by mouth daily. 3 tabs in am and 3 in pm, Disp: , Rfl:   BP 139/86  Pulse 102  Resp 24  Ht 5\' 5"  (1.651 m)  Wt 335 lb (151.955 kg)  BMI 55.75 kg/m2  Body mass index is 55.75 kg/(m^2).           Review of Systems denies chest pain, dyspnea on exertion, PND, orthopnea. Does have early diabetes not on insulin. Denies claudication or hemoptysis. Other systems are negative and complete review of systems    Objective:   Physical Exam blood pressure 139/86 heart rate 102 respirations 24 Gen.-alert and oriented x3 in no apparent  distress HEENT normal for age Lungs no rhonchi or wheezing Cardiovascular regular rhythm no murmurs carotid pulses 3+ palpable no bruits audible Abdomen soft nontender no palpable masses-morbidly obese Musculoskeletal free of  major deformities Skin clear -no rashes Neurologic normal Lower extremities 3+ femoral and dorsalis pedis pulses palpable bilaterally with 1+ edema bilaterally. Early hyperpigmentation lower third of both legs with thickening of skin. No active ulcers noted. Feet adequately perfused.  Today I ordered bilateral venous duplex exams which are reviewed and interpreted. She has gross reflux in both great saphenous systems with large great saphenous veins and no DVT.       Assessment:     Severe bilateral venous insufficiency with gross reflux bilateral great saphenous veins with chronic edema and skin changes-symptoms affecting daily living    Plan:     #1 long-leg elastic compression stockings 20-30 mm gradient #2 elevate legs as much as possible during the day #3 ibuprofen on a daily basis #4 return in 3 months if no significant improvement she needs bilateral great saphenous vein laser ablation procedures

## 2011-12-22 ENCOUNTER — Encounter: Payer: Self-pay | Admitting: Vascular Surgery

## 2011-12-23 ENCOUNTER — Ambulatory Visit (INDEPENDENT_AMBULATORY_CARE_PROVIDER_SITE_OTHER): Payer: 59 | Admitting: Vascular Surgery

## 2011-12-23 ENCOUNTER — Encounter: Payer: Self-pay | Admitting: Vascular Surgery

## 2011-12-23 VITALS — BP 131/84 | HR 99 | Ht 65.0 in | Wt 337.0 lb

## 2011-12-23 DIAGNOSIS — I83893 Varicose veins of bilateral lower extremities with other complications: Secondary | ICD-10-CM

## 2011-12-23 NOTE — Progress Notes (Signed)
Subjective:     Patient ID: Amy Hunter, female   DOB: [birth year], 51 y.o.   MRN: [id number]  HPI this 51 year old female returns for further followup regarding her severe bilateral venous insufficiency. She has chronic edema in both legs with significant thickening of the skin which causes pain. She describes aching throbbing and burning discomfort which worsens as the day progresses. This has been totally resistant to long-leg elastic compression stockings 20-30 mm gradient as well as elevation and ibuprofen. Her symptoms continued to affect her daily living. She has no history of stasis ulcers or bleeding.  Past Medical History  Diagnosis Date  . Diabetes mellitus   . Hypertension     History  Substance Use Topics  . Smoking status: Former Games developer  . Smokeless tobacco: Never Used  . Alcohol Use: No    History reviewed. No pertinent family history.  No Known Allergies  Current outpatient prescriptions:aspirin 81 MG tablet, Take 81 mg by mouth daily., Disp: , Rfl: ;  Cyanocobalamin (VITAMIN B-12) 2000 MCG TBCR, Take 1 tablet by mouth daily., Disp: , Rfl: ;  gabapentin (NEURONTIN) 100 MG capsule, Take 100 mg by mouth 2 (two) times daily., Disp: , Rfl: ;  levothyroxine (SYNTHROID, LEVOTHROID) 200 MCG tablet, Take 200 mcg by mouth daily. Take on Monday and 200 rest of week, Disp: , Rfl:  lisinopril (PRINIVIL,ZESTRIL) 2.5 MG tablet, Take 2.5 mg by mouth daily., Disp: , Rfl: ;  metoprolol succinate (TOPROL-XL) 25 MG 24 hr tablet, Take 25 mg by mouth daily., Disp: , Rfl: ;  potassium chloride (KLOR-CON) 20 MEQ packet, Take 20 mEq by mouth 2 (two) times daily., Disp: , Rfl: ;  pravastatin (PRAVACHOL) 20 MG tablet, Take 20 mg by mouth daily., Disp: , Rfl:  torsemide (DEMADEX) 20 MG tablet, Take 20 mg by mouth daily. 3 tabs in am and 3 in pm, Disp: , Rfl: ;  metFORMIN (GLUCOPHAGE) 500 MG tablet, Take 500 mg by mouth 2 (two) times daily with a meal., Disp: , Rfl:   BP 131/84  Pulse 99   Ht 5\' 5"  (1.651 m)  Wt 337 lb (152.862 kg)  BMI 56.08 kg/m2  SpO2 96%  Body mass index is 56.08 kg/(m^2).            Review of Systems denies chest pain, dyspnea on exertion, PND, orthopnea, hemoptysis, claudication to     Objective:   Physical Exam Blood pressure 131/84 heart rate 99 respirations 16 Lungs no rhonchi or wheezing Lower extremities with thickening of the skin from the mid calf distally and chronic 1+ to 2+ edema. No active ulceration. 3+ dorsalis pedis pulses palpable bilaterally.  Previous formal duplex exam revealed gross reflux throughout both great saphenous systems.    Assessment:     Severe bilateral venous insufficiency with significant skin changes and chronic edema and pain due to  gross reflux bilateral great saphenous systems    Plan:     Patient needs #1 laser ablation right great saphenous vein followed by #2 laser ablation left great saphenous vein Will proceed with precertification to perform this in the near future

## 2012-01-06 ENCOUNTER — Other Ambulatory Visit: Payer: Self-pay | Admitting: *Deleted

## 2012-01-06 DIAGNOSIS — I83893 Varicose veins of bilateral lower extremities with other complications: Secondary | ICD-10-CM

## 2012-01-09 ENCOUNTER — Encounter: Payer: Self-pay | Admitting: Vascular Surgery

## 2012-01-12 ENCOUNTER — Ambulatory Visit (INDEPENDENT_AMBULATORY_CARE_PROVIDER_SITE_OTHER): Payer: 59 | Admitting: Vascular Surgery

## 2012-01-12 ENCOUNTER — Encounter: Payer: Self-pay | Admitting: Vascular Surgery

## 2012-01-12 VITALS — BP 129/84 | HR 89 | Resp 16 | Ht 65.0 in | Wt 387.0 lb

## 2012-01-12 DIAGNOSIS — I83893 Varicose veins of bilateral lower extremities with other complications: Secondary | ICD-10-CM

## 2012-01-12 NOTE — Progress Notes (Signed)
Subjective:     Patient ID: Amy Hunter, female   DOB: 1960/08/16, 51 y.o.   MRN: [id number]  HPI this 51 year old female had laser ablation of the right great saphenous vein performed under local tumescent anesthesia for venous hypertension with skin changes and edema. A total of 2005 J of energy was utilized. She tolerated the procedure well.   Review of Systems     Objective:   Physical ExamBP 129/84  Pulse 89  Resp 16  Ht 5\' 5"  (1.651 m)  Wt 387 lb (175.542 kg)  BMI 64.40 kg/m2       Assessment:     Well-tolerated laser ablation right great saphenous vein performed from venous hypertension with skin changes and edema-performed under local tumescent anesthesia    Plan:     Return in one week for venous duplex exam right leg to confirm closure right great saphenous vein. We'll then have similar procedure performed contralateral left leg near future

## 2012-01-12 NOTE — Progress Notes (Signed)
Laser Ablation Procedure      Date: 01/12/2012    Amy Hunter DOB:11-14-1960  Consent signed: Yes  Surgeon:J.D. Hart [place]  Procedure: Laser Ablation: right Greater Saphenous Vein  BP 129/84  Pulse 89  Resp 16  Ht 5\' 5"  (1.651 m)  Wt 387 lb (175.542 kg)  BMI 64.40 kg/m2  Start time: 9:05   End time: 9:50  Tumescent Anesthesia: 425 cc 0.9% NaCl with 50 cc Lidocaine HCL with 1% Epi and 15 cc 8.4% NaHCO3  Local Anesthesia: 6 cc Lidocaine HCL and NaHCO3 (ratio 2:1)  Pulsed mode: Watts 15 Seconds 1 Pulses:1 Total Pulses:134 Total Energy: 2005 Total Time: 2:13     Patient tolerated procedure well: Yes  Notes:   Description of Procedure:  After marking the course of the saphenous vein and the secondary varicosities in the standing position, the patient was placed on the operating table in the supine position, and the right leg was prepped and draped in sterile fashion. Local anesthetic was administered, and under ultrasound guidance the saphenous vein was accessed with a micro needle and guide wire; then the micro puncture sheath was placed. A guide wire was inserted to the saphenofemoral junction, followed by a 5 french sheath.  The position of the sheath and then the laser fiber below the junction was confirmed using the ultrasound and visualization of the aiming beam.  Tumescent anesthesia was administered along the course of the saphenous vein using ultrasound guidance. Protective laser glasses were placed on the patient, and the laser was fired at 15 watt pulsed mode advancing 1-2 mm per sec.  For a total of 2005 joules.  A steri strip was applied to the puncture site.    ABD pads and thigh high compression stockings were applied.  Ace wrap bandages were applied over the phlebectomy sites and at the top of the saphenofemoral junction.  Blood loss was less than 15 cc.  The patient ambulated out of the operating room having tolerated the procedure well.

## 2012-01-13 ENCOUNTER — Telehealth: Payer: Self-pay | Admitting: *Deleted

## 2012-01-13 NOTE — Telephone Encounter (Signed)
Pt doing well. No problems or pain. Following all instructions. Reminded her of her fu appt next week.

## 2012-01-14 ENCOUNTER — Encounter: Payer: Self-pay | Admitting: Vascular Surgery

## 2012-01-19 ENCOUNTER — Encounter: Payer: Self-pay | Admitting: Vascular Surgery

## 2012-01-20 ENCOUNTER — Ambulatory Visit (INDEPENDENT_AMBULATORY_CARE_PROVIDER_SITE_OTHER): Payer: 59 | Admitting: Vascular Surgery

## 2012-01-20 ENCOUNTER — Encounter: Payer: Self-pay | Admitting: Vascular Surgery

## 2012-01-20 ENCOUNTER — Encounter (INDEPENDENT_AMBULATORY_CARE_PROVIDER_SITE_OTHER): Payer: 59 | Admitting: *Deleted

## 2012-01-20 VITALS — BP 123/83 | HR 96 | Resp 16 | Ht 66.0 in | Wt 332.0 lb

## 2012-01-20 DIAGNOSIS — Z48812 Encounter for surgical aftercare following surgery on the circulatory system: Secondary | ICD-10-CM

## 2012-01-20 DIAGNOSIS — I83893 Varicose veins of bilateral lower extremities with other complications: Secondary | ICD-10-CM

## 2012-01-20 NOTE — Progress Notes (Signed)
Subjective:     Patient ID: Amy Hunter, female   DOB: 1960-07-28, 51 y.o.   MRN: [id number]  HPI this 51 year old female returns 1 week post laser ablation right great saphenous vein performed for venous hypertension with chronic pain and edema and thickening of the skin distally. She tolerated the procedure well last week and today states that she has had mild discomfort in the proximal thigh. She has noted a change in the size of her ankle with less tightness aching and throbbing. She has been wearing her long leg elastic compression stocking.   Past Medical History  Diagnosis Date  . Diabetes mellitus   . Hypertension     History  Substance Use Topics  . Smoking status: Former Games developer  . Smokeless tobacco: Never Used  . Alcohol Use: No    No family history on file.  No Known Allergies  Current outpatient prescriptions:aspirin 81 MG tablet, Take 81 mg by mouth daily., Disp: , Rfl: ;  Cyanocobalamin (VITAMIN B-12) 2000 MCG TBCR, Take 1 tablet by mouth daily., Disp: , Rfl: ;  gabapentin (NEURONTIN) 100 MG capsule, Take 100 mg by mouth 2 (two) times daily., Disp: , Rfl: ;  levothyroxine (SYNTHROID, LEVOTHROID) 200 MCG tablet, Take 200 mcg by mouth daily. Take on Monday and 200 rest of week, Disp: , Rfl:  lisinopril (PRINIVIL,ZESTRIL) 2.5 MG tablet, Take 2.5 mg by mouth daily., Disp: , Rfl: ;  metFORMIN (GLUCOPHAGE) 500 MG tablet, Take 500 mg by mouth 2 (two) times daily with a meal., Disp: , Rfl: ;  metoprolol succinate (TOPROL-XL) 25 MG 24 hr tablet, Take 25 mg by mouth daily., Disp: , Rfl: ;  potassium chloride (KLOR-CON) 20 MEQ packet, Take 20 mEq by mouth 2 (two) times daily., Disp: , Rfl:  pravastatin (PRAVACHOL) 20 MG tablet, Take 20 mg by mouth daily., Disp: , Rfl: ;  torsemide (DEMADEX) 20 MG tablet, Take 20 mg by mouth daily. 3 tabs in am and 3 in pm, Disp: , Rfl:   BP 123/83  Pulse 96  Resp 16  Ht 5\' 6"  (1.676 m)  Wt 332 lb (150.594 kg)  BMI 53.59 kg/m2  Body mass  index is 53.59 kg/(m^2).          Review of Systems denies chest pain, hemoptysis, claudication     Objective:   Physical Exam blood pressure 123 bradycardia 3 heart rate 96 respirations 16 General obese legs female in no apparent stress alert and oriented x3 Lungs no rhonchi or wheezing Cardiovascular regular and no murmurs Right lower extremity with 3+ dorsalis pedis pulse palpable. Mild discomfort along the course of the great saphenous vein in the mid thigh area. No ulcerations or bulging varicosities noted. 1+ distal edema.  Data ordered a venous duplex exam right leg which are reviewed and interpreted. There is no DVT. Great saphenous vein has been closed from 2 cm from the saphenofemoral junction to the distal thigh.    Assessment:     Successful ablation right great saphenous vein for venous hypertension in swelling and pain    Plan:     Plan similar procedure contralateral left leg on December 30

## 2012-01-30 ENCOUNTER — Encounter: Payer: Self-pay | Admitting: Vascular Surgery

## 2012-02-02 ENCOUNTER — Ambulatory Visit (INDEPENDENT_AMBULATORY_CARE_PROVIDER_SITE_OTHER): Payer: 59 | Admitting: Vascular Surgery

## 2012-02-02 ENCOUNTER — Encounter: Payer: Self-pay | Admitting: Vascular Surgery

## 2012-02-02 VITALS — BP 128/83 | HR 102 | Resp 16 | Ht 66.0 in | Wt 335.0 lb

## 2012-02-02 DIAGNOSIS — I83893 Varicose veins of bilateral lower extremities with other complications: Secondary | ICD-10-CM

## 2012-02-02 NOTE — Progress Notes (Signed)
Subjective:     Patient ID: Amy Hunter, female   DOB: 10/24/1960, 51 y.o.   MRN: [id number]  HPI this 51 year old female had laser ablation of the left great saphenous vein performed under local tumescent anesthesia for venous hypertension with gross reflux in the great saphenous vein. A total of 2200 J of energy was utilized   Review of Systems     Objective:   Physical ExamBP 128/83  Pulse 102  Resp 16  Ht 5\' 6"  (1.676 m)  Wt 335 lb (151.955 kg)  BMI 54.07 kg/m2       Assessment:    well-tolerated laser ablation left great saphenous vein performed under local tumescent anesthesia    Plan:     Return 02/10/2012 for venous duplex exam left leg to confirm closure left great saphenous vein

## 2012-02-02 NOTE — Progress Notes (Signed)
Laser Ablation Procedure      Date: 02/02/2012    Amy Hunter DOB:[birth year]  Consent signed: Yes  Surgeon:J.D. Hart [place]  Procedure: Laser Ablation: left Greater Saphenous Vein  BP 128/83  Pulse 102  Resp 16  Ht 5\' 6"  (1.676 m)  Wt 335 lb (151.955 kg)  BMI 54.07 kg/m2  Start time: 11:10   End time: 12:00  Tumescent Anesthesia: 400 cc 0.9% NaCl with 50 cc Lidocaine HCL with 1% Epi and 15 cc 8.4% NaHCO3  Local Anesthesia: 6 cc Lidocaine HCL and NaHCO3 (ratio 2:1)  Pulsed mode: Watts 15 Seconds 1 Pulses:1 Total Pulses:148 Total Energy: 2220 Total Time: 2:28    Patient tolerated procedure well: Yes  Notes:   Description of Procedure:  After marking the course of the saphenous vein and the secondary varicosities in the standing position, the patient was placed on the operating table in the supine position, and the left leg was prepped and draped in sterile fashion. Local anesthetic was administered, and under ultrasound guidance the saphenous vein was accessed with a micro needle and guide wire; then the micro puncture sheath was placed. A guide wire was inserted to the saphenofemoral junction, followed by a 5 french sheath.  The position of the sheath and then the laser fiber below the junction was confirmed using the ultrasound and visualization of the aiming beam.  Tumescent anesthesia was administered along the course of the saphenous vein using ultrasound guidance. Protective laser glasses were placed on the patient, and the laser was fired at 15 watt pulsed mode advancing 1-2 mm per sec.  For a total of 2220 joules.  A steri strip was applied to the puncture site.    ABD pads and thigh high compression stockings were applied.  Ace wrap bandages were applied over the phlebectomy sites and at the top of the saphenofemoral junction.  Blood loss was less than 15 cc.  The patient ambulated out of the operating room having tolerated the procedure well.

## 2012-02-03 ENCOUNTER — Telehealth: Payer: Self-pay | Admitting: *Deleted

## 2012-02-03 NOTE — Telephone Encounter (Signed)
Reached patient at home. She is in a bit of pain but doing well and following all instructions. Reminded her of her fu appt.

## 2012-02-09 ENCOUNTER — Encounter: Payer: Self-pay | Admitting: Vascular Surgery

## 2012-02-10 ENCOUNTER — Encounter: Payer: Self-pay | Admitting: Vascular Surgery

## 2012-02-10 ENCOUNTER — Encounter (INDEPENDENT_AMBULATORY_CARE_PROVIDER_SITE_OTHER): Payer: 59 | Admitting: *Deleted

## 2012-02-10 ENCOUNTER — Ambulatory Visit (INDEPENDENT_AMBULATORY_CARE_PROVIDER_SITE_OTHER): Payer: 59 | Admitting: Vascular Surgery

## 2012-02-10 VITALS — BP 140/84 | HR 124 | Resp 16 | Ht 66.0 in | Wt 335.0 lb

## 2012-02-10 DIAGNOSIS — I83893 Varicose veins of bilateral lower extremities with other complications: Secondary | ICD-10-CM

## 2012-02-10 DIAGNOSIS — Z01810 Encounter for preprocedural cardiovascular examination: Secondary | ICD-10-CM

## 2012-02-10 NOTE — Progress Notes (Signed)
Subjective:     Patient ID: Amy Hunter, female   DOB: [birth year], 52 y.o.   MRN: [id number]  HPI this 52 year old female returns 1 week post laser ablation left great saphenous vein for venous hypertension with chronic edema and pain. Check similar procedure performed in the right leg earlier. She has had improvement in swelling of the left leg even after one week. She is wearing her long light elastic compression stocking without difficulty. She states the leg feels less tight.   Past Medical History  Diagnosis Date  . Diabetes mellitus   . Hypertension     History  Substance Use Topics  . Smoking status: Former Games developer  . Smokeless tobacco: Never Used  . Alcohol Use: No    No family history on file.  No Known Allergies  Current outpatient prescriptions:aspirin 81 MG tablet, Take 81 mg by mouth daily., Disp: , Rfl: ;  Cyanocobalamin (VITAMIN B-12) 2000 MCG TBCR, Take 1 tablet by mouth daily., Disp: , Rfl: ;  gabapentin (NEURONTIN) 100 MG capsule, Take 100 mg by mouth 2 (two) times daily., Disp: , Rfl: ;  levothyroxine (SYNTHROID, LEVOTHROID) 200 MCG tablet, Take 200 mcg by mouth daily. Take on Monday and 200 rest of week, Disp: , Rfl:  lisinopril (PRINIVIL,ZESTRIL) 2.5 MG tablet, Take 2.5 mg by mouth daily., Disp: , Rfl: ;  metFORMIN (GLUCOPHAGE) 500 MG tablet, Take 500 mg by mouth 2 (two) times daily with a meal., Disp: , Rfl: ;  metoprolol succinate (TOPROL-XL) 25 MG 24 hr tablet, Take 25 mg by mouth daily., Disp: , Rfl: ;  potassium chloride (KLOR-CON) 20 MEQ packet, Take 20 mEq by mouth 2 (two) times daily., Disp: , Rfl:  pravastatin (PRAVACHOL) 20 MG tablet, Take 20 mg by mouth daily., Disp: , Rfl: ;  torsemide (DEMADEX) 20 MG tablet, Take 20 mg by mouth daily. 3 tabs in am and 3 in pm, Disp: , Rfl:   BP 140/84  Pulse 124  Resp 16  Ht 5\' 6"  (1.676 m)  Wt 335 lb (151.955 kg)  BMI 54.07 kg/m2  Body mass index is 54.07 kg/(m^2).           Review of Systems  denies chest pain, dyspnea on exertion, PND, orthopnea, hemoptysis, claudication     Objective:   Physical Exam blood pressure 140/84 heart rate 124 respirations 16 General obese female in no apparent distress alert and oriented x3 Lungs no rhonchi or wheezing Cardiovascular regular rhythm no murmurs Left leg with mild discomfort along the course of great saphenous vein and thigh. 1+ right edema. No active ulcers noted. 3+ dorsalis  pedis pulse palpable.  Today I ordered a venous duplex exam of the left leg which are reviewed and interpreted. The great saphenous vein is totally closed up to near the saphenofemoral junction. There is no DVT.     Assessment:     Successful bilateral laser ablation great saphenous veins for venous hypertension with improvement in pain and swelling    Plan:     Return on a when necessary basis-will wear long leg stocking one more week and then convert to below knee stockings on a chronic basis

## 2012-03-24 ENCOUNTER — Other Ambulatory Visit (HOSPITAL_COMMUNITY): Payer: Self-pay | Admitting: Family Medicine

## 2012-03-24 ENCOUNTER — Ambulatory Visit (HOSPITAL_COMMUNITY)
Admission: RE | Admit: 2012-03-24 | Discharge: 2012-03-24 | Disposition: A | Discharge status: Home / Self Care | Payer: 59 | Source: Ambulatory Visit | Attending: Family Medicine | Admitting: Family Medicine

## 2012-03-24 DIAGNOSIS — R0602 Shortness of breath: Secondary | ICD-10-CM | POA: Insufficient documentation

## 2012-03-25 ENCOUNTER — Other Ambulatory Visit (HOSPITAL_COMMUNITY): Payer: Self-pay | Admitting: Family Medicine

## 2012-03-25 ENCOUNTER — Ambulatory Visit (HOSPITAL_COMMUNITY)
Admission: RE | Admit: 2012-03-25 | Discharge: 2012-03-25 | Disposition: A | Discharge status: Home / Self Care | Payer: 59 | Source: Ambulatory Visit | Attending: Family Medicine | Admitting: Family Medicine

## 2012-03-25 DIAGNOSIS — R0602 Shortness of breath: Secondary | ICD-10-CM | POA: Insufficient documentation

## 2012-03-25 DIAGNOSIS — R0600 Dyspnea, unspecified: Secondary | ICD-10-CM

## 2012-03-25 DIAGNOSIS — M7989 Other specified soft tissue disorders: Secondary | ICD-10-CM | POA: Insufficient documentation

## 2012-03-25 LAB — POCT I-STAT, CHEM 8
BUN: 18 mg/dL (ref 6–23)
Calcium, Ion: 1.07 mmol/L — ABNORMAL LOW (ref 1.12–1.23)
Chloride: 106 mEq/L (ref 96–112)
Creatinine, Ser: 1 mg/dL (ref 0.50–1.10)
Glucose, Bld: 101 mg/dL — ABNORMAL HIGH (ref 70–99)
HCT: 41 % (ref 36.0–46.0)
Potassium: 4.7 mEq/L (ref 3.5–5.1)

## 2012-03-25 MED ORDER — IOHEXOL 350 MG/ML SOLN
100.0000 mL | Freq: Once | INTRAVENOUS | Status: AC | PRN
  Administered 2012-03-25: 100 mL via INTRAVENOUS

## 2012-03-25 NOTE — Progress Notes (Signed)
Blood sample obtained from right arm IV for Creatnine level.  

## 2012-03-26 ENCOUNTER — Ambulatory Visit (HOSPITAL_COMMUNITY)
Admission: RE | Admit: 2012-03-26 | Discharge: 2012-03-26 | Disposition: A | Discharge status: Home / Self Care | Payer: 59 | Source: Ambulatory Visit | Attending: Family Medicine | Admitting: Family Medicine

## 2012-03-26 DIAGNOSIS — R0609 Other forms of dyspnea: Secondary | ICD-10-CM | POA: Insufficient documentation

## 2012-03-26 DIAGNOSIS — R0989 Other specified symptoms and signs involving the circulatory and respiratory systems: Secondary | ICD-10-CM

## 2012-03-26 DIAGNOSIS — E119 Type 2 diabetes mellitus without complications: Secondary | ICD-10-CM | POA: Insufficient documentation

## 2012-03-26 DIAGNOSIS — Z6841 Body Mass Index (BMI) 40.0 and over, adult: Secondary | ICD-10-CM | POA: Insufficient documentation

## 2012-03-26 DIAGNOSIS — I10 Essential (primary) hypertension: Secondary | ICD-10-CM | POA: Insufficient documentation

## 2012-03-26 DIAGNOSIS — Z87891 Personal history of nicotine dependence: Secondary | ICD-10-CM | POA: Insufficient documentation

## 2012-03-26 NOTE — Progress Notes (Signed)
*  PRELIMINARY RESULTS* Echocardiogram 2D Echocardiogram has been performed.  Amy Hunter 03/26/2012, 9:00 AM

## 2012-03-30 ENCOUNTER — Encounter: Payer: Self-pay | Admitting: Adult Health

## 2012-03-30 ENCOUNTER — Ambulatory Visit (INDEPENDENT_AMBULATORY_CARE_PROVIDER_SITE_OTHER): Payer: 59 | Admitting: Adult Health

## 2012-03-30 VITALS — BP 118/63 | HR 99 | Ht 66.0 in | Wt 343.0 lb

## 2012-03-30 DIAGNOSIS — R0609 Other forms of dyspnea: Secondary | ICD-10-CM

## 2012-03-30 DIAGNOSIS — I83893 Varicose veins of bilateral lower extremities with other complications: Secondary | ICD-10-CM

## 2012-03-30 DIAGNOSIS — R0989 Other specified symptoms and signs involving the circulatory and respiratory systems: Secondary | ICD-10-CM

## 2012-03-30 DIAGNOSIS — R0602 Shortness of breath: Secondary | ICD-10-CM

## 2012-03-30 DIAGNOSIS — E118 Type 2 diabetes mellitus with unspecified complications: Secondary | ICD-10-CM

## 2012-03-30 MED ORDER — METOPROLOL SUCCINATE ER 50 MG PO TB24: 50.0000 mg | ORAL_TABLET | Freq: Every day | ORAL | Status: DC

## 2012-03-30 NOTE — Progress Notes (Signed)
HPI: Amy Hunter is a pleasant 52 y/o morbidly obese  BMI 54.07. CF we are seeing on referral to in our clinic at the kind request of Dr. Gerda Diss, due to increased DOE, PND,  chest pressure and orthopnea to evaluate further for cardiac etiology of symptoms.  She has been awakening at night with shortness of breath and chest tightness. She has other history to include diabetes, hypothyroidism and hypertension. She has had a recent echocardiogram completed on 03/26/2012 with moderate LVH, EF of 65%, and grade 1 diastolic CHF. Cardiac cath in 02/2008 with normal coronaries, EF of 60%.   No Known Allergies  Current Outpatient Prescriptions  Medication Sig Dispense Refill  . aspirin 81 MG tablet Take 81 mg by mouth daily.      . Cyanocobalamin (VITAMIN B-12) 2000 MCG TBCR Take 1 tablet by mouth daily.      Marland Kitchen gabapentin (NEURONTIN) 100 MG capsule Take 100 mg by mouth 2 (two) times daily.      Marland Kitchen levothyroxine (SYNTHROID, LEVOTHROID) 200 MCG tablet Take 200 mcg by mouth daily. Take on Monday and 200 rest of week      . lisinopril (PRINIVIL,ZESTRIL) 2.5 MG tablet Take 2.5 mg by mouth daily.      . metFORMIN (GLUCOPHAGE) 500 MG tablet Take 500 mg by mouth 2 (two) times daily with a meal.      . metoprolol succinate (TOPROL-XL) 50 MG 24 hr tablet Take 1 tablet (50 mg total) by mouth daily.  90 tablet  3  . potassium chloride (KLOR-CON) 20 MEQ packet Take 20 mEq by mouth 2 (two) times daily.      . pravastatin (PRAVACHOL) 20 MG tablet Take 20 mg by mouth daily.      Marland Kitchen torsemide (DEMADEX) 20 MG tablet Take 20 mg by mouth daily. 3 tabs in am and 3 in pm       No current facility-administered medications for this visit.    Past Medical History  Diagnosis Date  . Diabetes mellitus   . Hypertension     Past Surgical History  Procedure Laterality Date  . Cholecystectomy      Family History: Diabetes and Hypertension  History   Social History  . Marital Status: Married    Spouse Name: N/A      Number of Children: N/A  . Years of Education: N/A   Occupational History  . Not on file.   Social History Main Topics  . Smoking status: Former Games developer  . Smokeless tobacco: Never Used  . Alcohol Use: No  . Drug Use: No  . Sexually Active: Not on file   Other Topics Concern  . Not on file   Social History Narrative  . No narrative on file    ZOX:[id number] of systems complete and found to be negative unless listed above   PHYSICAL EXAM BP 118/63  Pulse 99  Ht 5\' 6"  (1.676 m)  Wt 343 lb (155.584 kg)  BMI 55.39 kg/m2  SpO2 96%  General: Well developed, well nourished, morbidly obese,  in no acute distress Head: Eyes PERRLA, No xanthomas.   Normal cephalic and atramatic  Lungs: Clear bilaterally to auscultation and percussion. Heart: HRRR S1 S2 tachycardic,   Pulses are 2+ & equal.            No carotid bruit. No JVD.  No abdominal bruits. No femoral bruits. Abdomen: Bowel sounds are positive, very obese, abdomen soft and non-tender without masses or  Hernia's noted. Msk:  Back normal, normal gait. Normal strength and tone for age. Extremities: No clubbing, cyanosis or 1+ edema in the right leg with venous stasis discoloration.  DP +1 Neuro: Alert and oriented X 3. Psych:  Good affect, responds appropriately  EKG: Sinus tachycardia rate of 105 bpm.  ASSESSMENT AND PLAN

## 2012-03-30 NOTE — Progress Notes (Deleted)
Name: Amy Hunter    DOB: [birth year]  Age: 52 y.o.  MR#: [id number]       PCP:  Lilyan Punt, MD      Insurance: Payor: [employer]  Plan: [place] [facility]  Product Type: *No Product type*    CC:   No chief complaint on file.   VS Filed Vitals:   03/30/12 1103  BP: 118/63  Pulse: 99  Height: 5\' 6"  (1.676 m)  Weight: 343 lb (155.584 kg)  SpO2: 96%    Weights Current Weight  03/30/12 343 lb (155.584 kg)  02/10/12 335 lb (151.955 kg)  02/02/12 335 lb (151.955 kg)    Blood Pressure  BP Readings from Last 3 Encounters:  03/30/12 118/63  02/10/12 140/84  02/02/12 128/83     Admit date:  (Not on file) Last encounter with RMR:  Visit date not found   Allergy Review of patient's allergies indicates no known allergies.  Current Outpatient Prescriptions  Medication Sig Dispense Refill  . aspirin 81 MG tablet Take 81 mg by mouth daily.      . Cyanocobalamin (VITAMIN B-12) 2000 MCG TBCR Take 1 tablet by mouth daily.      Marland Kitchen gabapentin (NEURONTIN) 100 MG capsule Take 100 mg by mouth 2 (two) times daily.      Marland Kitchen levothyroxine (SYNTHROID, LEVOTHROID) 200 MCG tablet Take 200 mcg by mouth daily. Take on Monday and 200 rest of week      . lisinopril (PRINIVIL,ZESTRIL) 2.5 MG tablet Take 2.5 mg by mouth daily.      . metFORMIN (GLUCOPHAGE) 500 MG tablet Take 500 mg by mouth 2 (two) times daily with a meal.      . metoprolol succinate (TOPROL-XL) 25 MG 24 hr tablet Take 25 mg by mouth daily.      . potassium chloride (KLOR-CON) 20 MEQ packet Take 20 mEq by mouth 2 (two) times daily.      . pravastatin (PRAVACHOL) 20 MG tablet Take 20 mg by mouth daily.      Marland Kitchen torsemide (DEMADEX) 20 MG tablet Take 20 mg by mouth daily. 3 tabs in am and 3 in pm       No current facility-administered medications for this visit.    Discontinued Meds:   There are no discontinued medications.  Patient Active Problem List  Diagnosis  . TRIGGER FINGER  . Venous insufficiency  . Varicose  veins of lower extremities with other complications    LABS    Component Value Date/Time   NA 139 03/25/2012 1041   NA 141 08/07/2010 1851   NA 139 06/07/2010 1714   K 4.7 03/25/2012 1041   K 3.6 08/07/2010 1851   K 3.4* 06/07/2010 1714   CL 106 03/25/2012 1041   CL 102 08/07/2010 1851   CL 100 06/07/2010 1714   CO2 28 08/07/2010 1851   CO2 29 06/07/2010 1714   CO2 26 08/11/2009 0251   GLUCOSE 101* 03/25/2012 1041   GLUCOSE 96 08/07/2010 1851   GLUCOSE 139* 06/07/2010 1714   BUN 18 03/25/2012 1041   BUN 15 08/07/2010 1851   BUN 17 06/07/2010 1714   CREATININE 1.00 03/25/2012 1041   CREATININE 0.68 08/07/2010 1851   CREATININE 0.81 06/07/2010 1714   CALCIUM 9.0 08/07/2010 1851   CALCIUM 9.9 06/07/2010 1714   CALCIUM 9.3 08/11/2009 0251   GFRNONAA >60 08/07/2010 1851   GFRNONAA >60 06/07/2010 1714   GFRNONAA >60 08/11/2009 0251   GFRAA >60 08/07/2010  1851   GFRAA  Value: >60        The eGFR has been calculated using the MDRD equation. This calculation has not been validated in all clinical situations. eGFR's persistently <60 mL/min signify possible Chronic Kidney Disease. 06/07/2010 1714   GFRAA  Value: >60        The eGFR has been calculated using the MDRD equation. This calculation has not been validated in all clinical situations. eGFR's persistently <60 mL/min signify possible Chronic Kidney Disease. 08/11/2009 0251   CMP     Component Value Date/Time   NA 139 03/25/2012 1041   K 4.7 03/25/2012 1041   CL 106 03/25/2012 1041   CO2 28 08/07/2010 1851   GLUCOSE 101* 03/25/2012 1041   BUN 18 03/25/2012 1041   CREATININE 1.00 03/25/2012 1041   CALCIUM 9.0 08/07/2010 1851   PROT 7.2 06/07/2010 1714   ALBUMIN 3.8 06/07/2010 1714   AST 26 06/07/2010 1714   ALT 48* 06/07/2010 1714   ALKPHOS 126* 06/07/2010 1714   BILITOT 0.3 06/07/2010 1714   GFRNONAA >60 08/07/2010 1851   GFRAA >60 08/07/2010 1851       Component Value Date/Time   WBC 11.1* 08/07/2010 1851   WBC 9.2 06/07/2010 1714   WBC 9.6 08/11/2009 0251   HGB 13.9 03/25/2012 1041   HGB  15.1* 08/07/2010 1851   HGB 15.0 06/07/2010 1714   HCT 41.0 03/25/2012 1041   HCT 45.8 08/07/2010 1851   HCT 44.6 06/07/2010 1714   MCV 88.8 08/07/2010 1851   MCV 86.9 06/07/2010 1714   MCV 86.4 08/11/2009 0251    Lipid Panel     Component Value Date/Time   CHOL  Value: 160        ATP III CLASSIFICATION:  <200     mg/dL   Desirable  409-811  mg/dL   Borderline High  >=914    mg/dL   High        7/82/9562 0732   TRIG 148 02/23/2008 0732   HDL 25* 02/23/2008 0732   CHOLHDL 6.4 02/23/2008 0732   VLDL 30 02/23/2008 0732   LDLCALC  Value: 105        Total Cholesterol/HDL:CHD Risk Coronary Heart Disease Risk Table                     Men   Women  1/2 Average Risk   3.4   3.3  Average Risk       5.0   4.4  2 X Average Risk   9.6   7.1  3 X Average Risk  23.4   11.0        Use the calculated Patient Ratio above and the CHD Risk Table to determine the patient's CHD Risk.        ATP III CLASSIFICATION (LDL):  <100     mg/dL   Optimal  130-865  mg/dL   Near or Above                    Optimal  130-159  mg/dL   Borderline  784-696  mg/dL   High  >295     mg/dL   Very High* 2/84/1324 0732    ABG    Component Value Date/Time   TCO2 29 03/25/2012 1041     Lab Results  Component Value Date   TSH 5.929 ***Test methodology is 3rd generation TSH**** 02/23/2008   BNP (last 3 results) No results found for  this basename: PROBNP,  in the last 8760 hours Cardiac Panel (last 3 results) No results found for this basename: CKTOTAL, CKMB, TROPONINI, RELINDX,  in the last 72 hours  Iron/TIBC/Ferritin No results found for this basename: iron, tibc, ferritin     EKG Orders placed in visit on 03/30/12  . EKG 12-LEAD     Prior Assessment and Plan Problem List as of 03/30/2012     ICD-9-CM     Cardiology Problems   Venous insufficiency   Varicose veins of lower extremities with other complications     Other   TRIGGER FINGER       Imaging: Dg Chest 2 View  03/24/2012  *RADIOLOGY REPORT*  Clinical Data: 52 year old  female with shortness of breath. Dyspnea on exertion.  CHEST - 2 VIEW  Comparison: 08/07/2010 and earlier.  Findings: Stable cardiomegaly and mediastinal contours.  Stable lung volumes and 2010.  Mild chronic increased interstitial markings diffusely is not significantly changed.  No pneumothorax, pulmonary edema, pleural effusion or confluent pulmonary opacity. Visualized tracheal air column is within normal limits.  No acute osseous abnormality identified.  IMPRESSION: Chronic cardiomegaly and mild pulmonary interstitial changes. No acute cardiopulmonary abnormality.   Original Report Authenticated By: Erskine Speed, M.D.    Ct Angio Chest Pe W/cm &/or Wo Cm  03/25/2012  *RADIOLOGY REPORT*  Clinical Data: Severe shortness of breath.  CT ANGIOGRAPHY CHEST  Technique:  Multidetector CT imaging of the chest using the standard protocol during bolus administration of intravenous contrast. Multiplanar reconstructed images including MIPs were obtained and reviewed to evaluate the vascular anatomy.  Contrast: OMNIPAQUE IOHEXOL 350 MG/ML SOLN  Comparison: 02/22/2008.  Findings: No pulmonary embolus.  No pathologically enlarged mediastinal, hilar or axillary lymph nodes.  Heart is at the upper limits of normal in size.  No pericardial effusion.  Vague subpleural nodular densities along the minor fissure measure up to 5 mm and are unchanged from 02/22/2008, consistent with lymph nodes.  Scattered scarring and atelectasis in the lower lobes.  No pleural fluid.  Airway is unremarkable.  Incidental imaging of the upper abdomen shows no acute findings. No worrisome lytic or sclerotic lesions.  IMPRESSION:  1.  Negative for pulmonary embolus. 2.  Scattered scarring and atelectasis at the lung bases.  No additional findings to explain the patient's severe shortness of breath.   Original Report Authenticated By: Leanna Battles, M.D.

## 2012-03-30 NOTE — Assessment & Plan Note (Addendum)
She is exhibiting symptoms of sleep apnea, awakening at night with shortness of breath and chest pressure. She is severely obese. This complicates her stamina and breathing status with hypoventilation syndrome. She has had a sleep study greater than 10 years ago and was told she had sleep apnea then but never fitted for a CPAP.   I will plan a sleep study with [facility] pulmonology and PFTs.  She is severely deconditioned with walking O2 sat at 92% but only able to walk 3 minutes without before significant fatigue. Will not plan for more cardiac testing at this time.  She is tachycardic at rest. Will increase her metoprolol to 50 mg daily.

## 2012-03-30 NOTE — Assessment & Plan Note (Signed)
She is followed by Dr. Gerda Diss for ongoing assessment and treatment management.

## 2012-03-30 NOTE — Assessment & Plan Note (Signed)
Complication of diabetes. She is followed by Dr. Hart Rochester. Will defer to him for ongoing treatment.

## 2012-03-30 NOTE — Patient Instructions (Addendum)
Your physician recommends that you schedule a follow-up appointment in: 1 month  Your physician has recommended that you have a sleep study. This test records several body functions during sleep, including: brain activity, eye movement, oxygen and carbon dioxide blood levels, heart rate and rhythm, breathing rate and rhythm, the flow of air through your mouth and nose, snoring, body muscle movements, and chest and belly movement.  Your physician has recommended that you have a pulmonary function test. Pulmonary Function Tests are a group of tests that measure how well air moves in and out of your lungs.  Your physician has recommended you make the following change in your medication:  1 - INCREASE Metoprolol to 50 mg daily

## 2012-03-30 NOTE — Assessment & Plan Note (Signed)
Lengthy discussion by Dr. Daleen Squibb to her about wt loss and complications of breathing status with excess BMI. She verbalizes understanding. She may be a candidate for bariatric surgery at the discretion of her PCP

## 2012-04-07 ENCOUNTER — Ambulatory Visit (HOSPITAL_COMMUNITY)
Admission: RE | Admit: 2012-04-07 | Discharge: 2012-04-07 | Disposition: A | Discharge status: Home / Self Care | Payer: 59 | Source: Ambulatory Visit | Attending: Adult Health | Admitting: Adult Health

## 2012-04-07 DIAGNOSIS — R0989 Other specified symptoms and signs involving the circulatory and respiratory systems: Secondary | ICD-10-CM

## 2012-04-07 DIAGNOSIS — R0602 Shortness of breath: Secondary | ICD-10-CM | POA: Insufficient documentation

## 2012-04-07 LAB — BLOOD GAS, ARTERIAL
Acid-Base Excess: 1.5 mmol/L (ref 0.0–2.0)
O2 Saturation: 94.4 %
Patient temperature: 37
pO2, Arterial: 74.7 mmHg — ABNORMAL LOW (ref 80.0–100.0)

## 2012-04-13 LAB — PULMONARY FUNCTION TEST

## 2012-04-13 NOTE — Procedures (Signed)
Amy Hunter, Amy Hunter              ACCOUNT NO.:  [id number]  MEDICAL RECORD NO.:  [id number]  LOCATION:                                 FACILITY:  PHYSICIAN:  Edward L. Juanetta Gosling, M.D.DATE OF BIRTH:  1960-10-29  DATE OF PROCEDURE:  04/12/2012 DATE OF DISCHARGE:                           PULMONARY FUNCTION TEST   Reason for pulmonary function testing is shortness of breath. 1. Spirometry shows no ventilatory defect and no definite airflow     obstruction. 2. Lung volumes are normal. 3. DLCO is normal. 4. Airway resistance is essentially normal. 5. Arterial blood gas shows mild resting hypoxia. 6. There is no definite cause of shortness of breath.     Edward L. Juanetta Gosling, M.D.     EL[facility]/MEDQ  D:  04/12/2012  T:  04/13/2012  Job:  [id number]  cc:   Bettey Mare. Lyman Bishop, NP

## 2012-04-20 ENCOUNTER — Ambulatory Visit: Payer: 59 | Attending: Adult Health | Admitting: Sleep Medicine

## 2012-04-20 VITALS — Ht 66.0 in | Wt 339.0 lb

## 2012-04-20 DIAGNOSIS — R0609 Other forms of dyspnea: Secondary | ICD-10-CM

## 2012-04-20 DIAGNOSIS — G4733 Obstructive sleep apnea (adult) (pediatric): Secondary | ICD-10-CM | POA: Insufficient documentation

## 2012-04-25 NOTE — Progress Notes (Signed)
HPI: Amy Hunter is a pleasant 52 y/o morbidly obese BMI 54.07. CF we are seeing on referral to in our clinic at the kind request of Dr. Gerda Diss, due to increased DOE, PND, chest pressure and orthopnea to evaluate further for cardiac etiology of symptoms. She has been awakening at night with shortness of breath and chest tightness. She has other history to include diabetes, hypothyroidism and hypertension. She has had a recent echocardiogram completed on 03/26/2012 with moderate LVH, EF of 65%, and grade 1 diastolic CHF. Cardiac cath in 02/2008 with normal coronaries, EF of 60%. Secondary to dyspnea, PFTs were completed. These were found to be normal without evidence of lung disease or reactive airway disease.    She comes today feeling well. Without any further complaints of dyspnea on exertion. She has cut out carbs and has lost 10 pounds in one month. She is becoming more active. She did have a sleep study completed with results still pending. She is otherwise without complaint.     No Known Allergies  Current Outpatient Prescriptions  Medication Sig Dispense Refill  . aspirin 81 MG tablet Take 81 mg by mouth daily.      . Cyanocobalamin (VITAMIN B-12) 2000 MCG TBCR Take 1 tablet by mouth daily.      Marland Kitchen gabapentin (NEURONTIN) 100 MG capsule Take 100 mg by mouth 2 (two) times daily.      Marland Kitchen levothyroxine (SYNTHROID, LEVOTHROID) 200 MCG tablet Take 200 mcg by mouth daily. Take on Monday and 200 rest of week      . lisinopril (PRINIVIL,ZESTRIL) 2.5 MG tablet Take 2.5 mg by mouth daily.      . metFORMIN (GLUCOPHAGE) 500 MG tablet Take 500 mg by mouth 2 (two) times daily with a meal.      . metoprolol succinate (TOPROL-XL) 50 MG 24 hr tablet Take 1 tablet (50 mg total) by mouth daily.  90 tablet  3  . potassium chloride (KLOR-CON) 20 MEQ packet Take 20 mEq by mouth 2 (two) times daily.      . pravastatin (PRAVACHOL) 20 MG tablet Take 20 mg by mouth daily.      Marland Kitchen torsemide (DEMADEX) 20 MG  tablet TAKE 3 TABLETS BY MOUTH EVERY MORNING AND 3 TABLETS AT NOON  180 tablet  0   No current facility-administered medications for this visit.    Past Medical History  Diagnosis Date  . Diabetes mellitus   . Hypertension     Past Surgical History  Procedure Laterality Date  . Cholecystectomy      ROS:.Review of systems complete and found to be negative unless listed above  PHYSICAL EXAM BP 128/82  Pulse 100  Ht 5\' 6"  (1.676 m)  Wt 336 lb (152.409 kg)  BMI 54.26 kg/m2  SpO2 98% General: Well developed, well nourished, in no acute distress. Obese. Head: Eyes PERRLA, No xanthomas.   Normal cephalic and atramatic  Lungs: Clear bilaterally to auscultation and percussion. Heart: HRRR S1 S2, without MRG.  Pulses are 2+ & equal.            No carotid bruit. No JVD.  No abdominal bruits. No femoral bruits. Abdomen: Bowel sounds are positive, abdomen soft and non-tender without masses or                  Hernia's noted. Msk:  Back normal, normal gait. Normal strength and tone for age. Extremities: No clubbing, cyanosis or edema.  DP +1 Neuro: Alert and oriented X  3. Psych:  Good affect, responds appropriately    ASSESSMENT AND PLAN

## 2012-04-26 ENCOUNTER — Encounter: Payer: Self-pay | Admitting: *Deleted

## 2012-04-26 ENCOUNTER — Encounter: Payer: Self-pay | Admitting: Adult Health

## 2012-04-26 ENCOUNTER — Other Ambulatory Visit: Payer: Self-pay | Admitting: Family Medicine

## 2012-04-26 ENCOUNTER — Ambulatory Visit (INDEPENDENT_AMBULATORY_CARE_PROVIDER_SITE_OTHER): Payer: 59 | Admitting: Adult Health

## 2012-04-26 VITALS — BP 128/82 | HR 100 | Ht 66.0 in | Wt 336.0 lb

## 2012-04-26 DIAGNOSIS — R0609 Other forms of dyspnea: Secondary | ICD-10-CM

## 2012-04-26 DIAGNOSIS — R0989 Other specified symptoms and signs involving the circulatory and respiratory systems: Secondary | ICD-10-CM

## 2012-04-26 NOTE — Patient Instructions (Addendum)
Your physician recommends that you schedule a follow-up appointment in: 3 months.  

## 2012-04-26 NOTE — Assessment & Plan Note (Signed)
Her breathing status has improved. She has made changes in her lifestyle to avoid carbs and has lost 10 pounds. She is breathing easier. She is trying to increase her exercise, and stamina. I congratulated her weight loss and being symptom-free. This may have all been related to her obesity. She is going to try and lose approximately 10 pounds a month if possible by continuing healthy lifestyle changes. PFTs were found to be absolutely normal and I have showed her copies of the test results. We will wait sleep study results. We will see her again in 3 months unless she becomes symptomatic.

## 2012-04-26 NOTE — Assessment & Plan Note (Signed)
She has made some healthy lifestyle changes, has lost 10 pounds, and plans to continue to do so. I have encouraged her and congratulated her on her weight loss and hope that she will continue in this active low carbohydrate diet

## 2012-04-26 NOTE — Progress Notes (Deleted)
Name: Amy Hunter    DOB: [birth year]  Age: 52 y.o.  MR#: [id number]       PCP:  Lilyan Punt, MD      Insurance: Payor: [employer]  Plan: [place] [employer]  Product Type: *No Product type*    CC:   No chief complaint on file.   VS Filed Vitals:   04/26/12 1355  BP: 128/82  Pulse: 100  Height: 5\' 6"  (1.676 m)  Weight: 336 lb (152.409 kg)  SpO2: 98%    Weights Current Weight  04/26/12 336 lb (152.409 kg)  04/20/12 339 lb (153.769 kg)  03/30/12 343 lb (155.584 kg)    Blood Pressure  BP Readings from Last 3 Encounters:  04/26/12 128/82  03/30/12 118/63  02/10/12 140/84     Admit date:  (Not on file) Last encounter with RMR:  03/30/2012   Allergy Review of patient's allergies indicates no known allergies.  Current Outpatient Prescriptions  Medication Sig Dispense Refill  . aspirin 81 MG tablet Take 81 mg by mouth daily.      . Cyanocobalamin (VITAMIN B-12) 2000 MCG TBCR Take 1 tablet by mouth daily.      Marland Kitchen gabapentin (NEURONTIN) 100 MG capsule Take 100 mg by mouth 2 (two) times daily.      Marland Kitchen levothyroxine (SYNTHROID, LEVOTHROID) 200 MCG tablet Take 200 mcg by mouth daily. Take on Monday and 200 rest of week      . lisinopril (PRINIVIL,ZESTRIL) 2.5 MG tablet Take 2.5 mg by mouth daily.      . metFORMIN (GLUCOPHAGE) 500 MG tablet Take 500 mg by mouth 2 (two) times daily with a meal.      . metoprolol succinate (TOPROL-XL) 50 MG 24 hr tablet Take 1 tablet (50 mg total) by mouth daily.  90 tablet  3  . potassium chloride (KLOR-CON) 20 MEQ packet Take 20 mEq by mouth 2 (two) times daily.      . pravastatin (PRAVACHOL) 20 MG tablet Take 20 mg by mouth daily.      Marland Kitchen torsemide (DEMADEX) 20 MG tablet TAKE 3 TABLETS BY MOUTH EVERY MORNING AND 3 TABLETS AT NOON  180 tablet  0   No current facility-administered medications for this visit.    Discontinued Meds:   There are no discontinued medications.  Patient Active Problem List  Diagnosis  . TRIGGER FINGER  .  Venous insufficiency  . Varicose veins of lower extremities with other complications  . Dyspnea on exertion  . Morbid obesity  . Diabetes mellitus type 2, controlled, with complications    LABS    Component Value Date/Time   NA 139 03/25/2012 1041   NA 141 08/07/2010 1851   NA 139 06/07/2010 1714   K 4.7 03/25/2012 1041   K 3.6 08/07/2010 1851   K 3.4* 06/07/2010 1714   CL 106 03/25/2012 1041   CL 102 08/07/2010 1851   CL 100 06/07/2010 1714   CO2 28 08/07/2010 1851   CO2 29 06/07/2010 1714   CO2 26 08/11/2009 0251   GLUCOSE 101* 03/25/2012 1041   GLUCOSE 96 08/07/2010 1851   GLUCOSE 139* 06/07/2010 1714   BUN 18 03/25/2012 1041   BUN 15 08/07/2010 1851   BUN 17 06/07/2010 1714   CREATININE 1.00 03/25/2012 1041   CREATININE 0.68 08/07/2010 1851   CREATININE 0.81 06/07/2010 1714   CALCIUM 9.0 08/07/2010 1851   CALCIUM 9.9 06/07/2010 1714   CALCIUM 9.3 08/11/2009 0251   GFRNONAA >60 08/07/2010  1851   GFRNONAA >60 06/07/2010 1714   GFRNONAA >60 08/11/2009 0251   GFRAA >60 08/07/2010 1851   GFRAA  Value: >60        The eGFR has been calculated using the MDRD equation. This calculation has not been validated in all clinical situations. eGFR's persistently <60 mL/min signify possible Chronic Kidney Disease. 06/07/2010 1714   GFRAA  Value: >60        The eGFR has been calculated using the MDRD equation. This calculation has not been validated in all clinical situations. eGFR's persistently <60 mL/min signify possible Chronic Kidney Disease. 08/11/2009 0251   CMP     Component Value Date/Time   NA 139 03/25/2012 1041   K 4.7 03/25/2012 1041   CL 106 03/25/2012 1041   CO2 28 08/07/2010 1851   GLUCOSE 101* 03/25/2012 1041   BUN 18 03/25/2012 1041   CREATININE 1.00 03/25/2012 1041   CALCIUM 9.0 08/07/2010 1851   PROT 7.2 06/07/2010 1714   ALBUMIN 3.8 06/07/2010 1714   AST 26 06/07/2010 1714   ALT 48* 06/07/2010 1714   ALKPHOS 126* 06/07/2010 1714   BILITOT 0.3 06/07/2010 1714   GFRNONAA >60 08/07/2010 1851   GFRAA >60 08/07/2010 1851        Component Value Date/Time   WBC 11.1* 08/07/2010 1851   WBC 9.2 06/07/2010 1714   WBC 9.6 08/11/2009 0251   HGB 13.9 03/25/2012 1041   HGB 15.1* 08/07/2010 1851   HGB 15.0 06/07/2010 1714   HCT 41.0 03/25/2012 1041   HCT 45.8 08/07/2010 1851   HCT 44.6 06/07/2010 1714   MCV 88.8 08/07/2010 1851   MCV 86.9 06/07/2010 1714   MCV 86.4 08/11/2009 0251    Lipid Panel     Component Value Date/Time   CHOL  Value: 160        ATP III CLASSIFICATION:  <200     mg/dL   Desirable  161-096  mg/dL   Borderline High  >=045    mg/dL   High        05/12/8117 0732   TRIG 148 02/23/2008 0732   HDL 25* 02/23/2008 0732   CHOLHDL 6.4 02/23/2008 0732   VLDL 30 02/23/2008 0732   LDLCALC  Value: 105        Total Cholesterol/HDL:CHD Risk Coronary Heart Disease Risk Table                     Men   Women  1/2 Average Risk   3.4   3.3  Average Risk       5.0   4.4  2 X Average Risk   9.6   7.1  3 X Average Risk  23.4   11.0        Use the calculated Patient Ratio above and the CHD Risk Table to determine the patient's CHD Risk.        ATP III CLASSIFICATION (LDL):  <100     mg/dL   Optimal  147-829  mg/dL   Near or Above                    Optimal  130-159  mg/dL   Borderline  562-130  mg/dL   High  >865     mg/dL   Very High* 7/84/6962 0732    ABG    Component Value Date/Time   PHART 7.447 04/07/2012 1630   PCO2ART 37.1 04/07/2012 1630   PO2ART 74.7* 04/07/2012 1630  HCO3 25.2* 04/07/2012 1630   TCO2 22.1 04/07/2012 1630   O2SAT 94.4 04/07/2012 1630     Lab Results  Component Value Date   TSH 5.929 ***Test methodology is 3rd generation TSH**** 02/23/2008   BNP (last 3 results) No results found for this basename: PROBNP,  in the last 8760 hours Cardiac Panel (last 3 results) No results found for this basename: CKTOTAL, CKMB, TROPONINI, RELINDX,  in the last 72 hours  Iron/TIBC/Ferritin No results found for this basename: iron, tibc, ferritin     EKG Orders placed in visit on 03/30/12  . EKG 12-LEAD     Prior Assessment and  Plan Problem List as of 04/26/2012     ICD-9-CM     Cardiology Problems   Venous insufficiency   Varicose veins of lower extremities with other complications   Last Assessment & Plan   03/30/2012 Office Visit Written 03/30/2012 12:35 PM by Jodelle Gross, NP     Complication of diabetes. She is followed by Dr. Hart Rochester. Will defer to him for ongoing treatment.      Other   TRIGGER FINGER   Dyspnea on exertion   Last Assessment & Plan   03/30/2012 Office Visit Edited 03/30/2012 12:35 PM by Jodelle Gross, NP     She is exhibiting symptoms of sleep apnea, awakening at night with shortness of breath and chest pressure. She is severely obese. This complicates her stamina and breathing status with hypoventilation syndrome. She has had a sleep study greater than 10 years ago and was told she had sleep apnea then but never fitted for a CPAP.   I will plan a sleep study with [facility] pulmonology and PFTs.  She is severely deconditioned with walking O2 sat at 92% but only able to walk 3 minutes without before significant fatigue. Will not plan for more cardiac testing at this time.  She is tachycardic at rest. Will increase her metoprolol to 50 mg daily.    Morbid obesity   Last Assessment & Plan   03/30/2012 Office Visit Written 03/30/2012 12:37 PM by Jodelle Gross, NP     Lengthy discussion by Dr. Daleen Squibb to her about wt loss and complications of breathing status with excess BMI. She verbalizes understanding. She may be a candidate for bariatric surgery at the discretion of her PCP    Diabetes mellitus type 2, controlled, with complications   Last Assessment & Plan   03/30/2012 Office Visit Written 03/30/2012 12:37 PM by Jodelle Gross, NP     She is followed by Dr. Gerda Diss for ongoing assessment and treatment management.        Imaging: No results found.

## 2012-04-28 ENCOUNTER — Encounter (HOSPITAL_BASED_OUTPATIENT_CLINIC_OR_DEPARTMENT_OTHER): Payer: 59

## 2012-05-03 ENCOUNTER — Ambulatory Visit (INDEPENDENT_AMBULATORY_CARE_PROVIDER_SITE_OTHER): Payer: 59 | Admitting: Family Medicine

## 2012-05-03 ENCOUNTER — Encounter: Payer: Self-pay | Admitting: Family Medicine

## 2012-05-03 ENCOUNTER — Ambulatory Visit (HOSPITAL_COMMUNITY)
Admission: RE | Admit: 2012-05-03 | Discharge: 2012-05-03 | Disposition: A | Discharge status: Home / Self Care | Payer: 59 | Source: Ambulatory Visit | Attending: Family Medicine | Admitting: Family Medicine

## 2012-05-03 VITALS — BP 128/76 | Temp 97.4°F | Ht 66.0 in | Wt 329.4 lb

## 2012-05-03 DIAGNOSIS — G4733 Obstructive sleep apnea (adult) (pediatric): Secondary | ICD-10-CM | POA: Insufficient documentation

## 2012-05-03 DIAGNOSIS — I10 Essential (primary) hypertension: Secondary | ICD-10-CM | POA: Insufficient documentation

## 2012-05-03 DIAGNOSIS — R0602 Shortness of breath: Secondary | ICD-10-CM | POA: Insufficient documentation

## 2012-05-03 DIAGNOSIS — E119 Type 2 diabetes mellitus without complications: Secondary | ICD-10-CM | POA: Insufficient documentation

## 2012-05-03 DIAGNOSIS — R0609 Other forms of dyspnea: Secondary | ICD-10-CM

## 2012-05-03 DIAGNOSIS — R0600 Dyspnea, unspecified: Secondary | ICD-10-CM

## 2012-05-03 NOTE — Patient Instructions (Signed)
We will set up referrals If emergency go to er Get cxr

## 2012-05-03 NOTE — Progress Notes (Signed)
  Subjective:    Patient ID: Amy Hunter, female    DOB: 02-Feb-1961, 52 y.o.   MRN: [id number]  Shortness of Breath This is a recurrent problem. The current episode started in the past 7 days. The problem occurs constantly. The problem has been gradually worsening. Nothing aggravates the symptoms. The patient has no known risk factors for DVT/PE. She has tried ipratropium inhalers for the symptoms. The treatment provided mild relief.  this patient relates that the shortness of breath seems to be worse when she is thinking about it or staying still she does state to some degree she gets short short of breath or moving around she also knows that she chokes at night and she often has symptoms of sleep apnea she did do a sleep study but is still pending. She has seen cardiology they did a thorough cardiac evaluation told her it was not her heart that it was lung related pulmonary function test was negative sleep study is pending  Past medical history family history reviewed  Review of Systems  Respiratory: Positive for shortness of breath.    No vomiting headaches hematuria or rectal bleeding no joint pains positive for psoriasis    Objective:   Physical Exam  Vital signs noted, eardrums normal throat has a very narrow airway she also has narrow nasal passages lungs are clear hearts regular abdomen obese extremities no edema      Assessment & Plan:  Severe morbid obesity-patient is doing a good job of reducing her weight by watching her diet she will this she does not want to consider gastric bypass surgery  Sleep apnea await the tests more than likely will need CPAP machine referral to pulmonology who also deal with sleep apnea  Air oh upper airway-we'll have patient see ENT for evaluation it is possible she may need to have nasal septum/turbinate surgery as well as possibly even an oral surgery to open up her airway more  If she worsens over the next 48 hours go to the ER she had a  chest x-ray back in 2013 we will repeat a chest x-ray I would not recommend repeating CAT scan of the chest she just had one in February which was negative for pulmonary embolism

## 2012-05-04 DIAGNOSIS — G4710 Hypersomnia, unspecified: Secondary | ICD-10-CM

## 2012-05-04 DIAGNOSIS — G4730 Sleep apnea, unspecified: Secondary | ICD-10-CM

## 2012-05-04 NOTE — Procedures (Signed)
NAMESUNNI, Amy Hunter              ACCOUNT NO.:  [id number]  MEDICAL RECORD NO.:  [id number]          PATIENT TYPE:  OUT  LOCATION:  SLEEP CENTER                 FACILITY:  [facility]  PHYSICIAN:  Barbaraann Share, MD,FCCPDATE OF BIRTH:  01/27/1960  DATE OF STUDY:  04/20/2012                           NOCTURNAL POLYSOMNOGRAM  REFERRING PHYSICIAN:  Bettey Mare. Lawrence, NP  INDICATION FOR THE STUDY:  Hypersomnia with sleep apnea.  EPWORTH SLEEPINESS SCORE:  4.  SLEEP ARCHITECTURE:  The patient had a total sleep time of 187 minutes with very little slow-wave sleep or REM.  Sleep onset latency was prolonged at 62 minutes, and REM onset was very prolonged at 317 minutes.  Sleep efficiency was very poor at 46%.  RESPIRATORY DATA:  The patient was found to have 12 obstructive apneas, and 106 obstructive hypopneas, giving her an apnea-hypopnea index of 38 events per hour.  The events occurred in all body positions, and there was loud snoring noted throughout.  OXYGEN DATA:  The patient had a desaturation as low as 76% with her obstructive events, and at 1 point was put on 1 L of supplemental oxygen per sleep center protocol.  The patient was moved to a recliner during the night, and the oxygen was subsequently discontinued.  CARDIAC DATA:  Rare PVC noted, but no significant arrhythmias were seen.  MOVEMENTS-PARASOMNIA:  The patient did have 50 periodic limb movements with 8 per hour resulting in arousal or awakening.  She did not have any abnormal behaviors, but did have an episode of panic during the night, which require her to be moved to a recliner for the remainder of the study.  The patient usually sleeps in a recliner at home.  IMPRESSIONS-RECOMMENDATIONS: 1. Severe obstructive sleep apnea/hypopnea syndrome, with an AHI of 38     events per hour and oxygen desaturation as low as 76%.  Treatment     for this degree of sleep apnea should focus primarily on aggressive     weight loss  as well as CPAP. 2. Rare PVC noted, but no clinically significant arrhythmias were     seen. 3. Moderate numbers of leg jerks, unclear whether related to a     movement disorder of sleep or the patient's     significant sleep apnea.  Would monitor clinical response after     aggressive treatment of her sleep-disordered breathing.     Barbaraann Share, MD,FCCP Diplomate, American Board of Sleep Medicine    [facility]/MEDQ  D:  05/04/2012 08:51:22  T:  05/04/2012 23:13:58  Job:  [id number]

## 2012-05-05 ENCOUNTER — Other Ambulatory Visit: Payer: Self-pay | Admitting: Family Medicine

## 2012-05-05 DIAGNOSIS — R0600 Dyspnea, unspecified: Secondary | ICD-10-CM

## 2012-05-06 ENCOUNTER — Telehealth: Payer: Self-pay | Admitting: *Deleted

## 2012-05-06 NOTE — Telephone Encounter (Signed)
Message copied by Ovidio Kin on Thu May 06, 2012 11:04 AM ------      Message from: Jodelle Gross      Created: Wed May 05, 2012  1:17 PM      Regarding: Abnormal Sleep Study       Please forward sleep study report to Dr. Gerda Diss for referral for CPAP, as test was found to be very abnormal for OSA. ------

## 2012-05-06 NOTE — Telephone Encounter (Signed)
Faxed sleep study notations to MD Luking with notation addition as follows:  KL wanted this to be forwarded per noted needs to be addressed for CPAP

## 2012-05-07 ENCOUNTER — Other Ambulatory Visit: Payer: Self-pay | Admitting: Family Medicine

## 2012-05-07 DIAGNOSIS — G4730 Sleep apnea, unspecified: Secondary | ICD-10-CM

## 2012-05-10 ENCOUNTER — Other Ambulatory Visit: Payer: Self-pay | Admitting: Family Medicine

## 2012-05-10 ENCOUNTER — Telehealth: Payer: Self-pay | Admitting: Family Medicine

## 2012-05-10 NOTE — Telephone Encounter (Signed)
Per Misty Stanley at [facility] sleep lab - wrong test ordered, pt's already had sleep study, please cancel order and place order for CPAP titration study,

## 2012-05-11 NOTE — Telephone Encounter (Signed)
[facility] for [facility] Sleep Lab, Dr. Lorin Picket need to know wording of test needed in Epic so he can order

## 2012-05-11 NOTE — Telephone Encounter (Signed)
Per Misty Stanley @ sleep lab, code she uses is [id number]  Or we can fax old paper form and she will handle from there, will fax paper form seems easier

## 2012-05-11 NOTE — Telephone Encounter (Signed)
Please fax .thanks

## 2012-05-12 NOTE — Telephone Encounter (Signed)
Faxed form to [facility], they'll schedule and notify pt

## 2012-05-13 ENCOUNTER — Ambulatory Visit (INDEPENDENT_AMBULATORY_CARE_PROVIDER_SITE_OTHER): Payer: 59 | Admitting: Otolaryngology

## 2012-05-13 ENCOUNTER — Other Ambulatory Visit: Payer: Self-pay

## 2012-05-13 DIAGNOSIS — J310 Chronic rhinitis: Secondary | ICD-10-CM

## 2012-05-13 DIAGNOSIS — G4730 Sleep apnea, unspecified: Secondary | ICD-10-CM

## 2012-05-13 DIAGNOSIS — G4710 Hypersomnia, unspecified: Secondary | ICD-10-CM

## 2012-05-13 DIAGNOSIS — J343 Hypertrophy of nasal turbinates: Secondary | ICD-10-CM

## 2012-05-19 ENCOUNTER — Other Ambulatory Visit: Payer: Self-pay | Admitting: Family Medicine

## 2012-05-31 ENCOUNTER — Ambulatory Visit (INDEPENDENT_AMBULATORY_CARE_PROVIDER_SITE_OTHER): Payer: 59 | Admitting: Pulmonary Disease

## 2012-05-31 ENCOUNTER — Encounter: Payer: Self-pay | Admitting: Pulmonary Disease

## 2012-05-31 VITALS — BP 122/76 | HR 88 | Temp 96.5°F | Ht 66.0 in | Wt 322.0 lb

## 2012-05-31 DIAGNOSIS — R0609 Other forms of dyspnea: Secondary | ICD-10-CM

## 2012-05-31 DIAGNOSIS — R0989 Other specified symptoms and signs involving the circulatory and respiratory systems: Secondary | ICD-10-CM

## 2012-05-31 NOTE — Progress Notes (Signed)
  Subjective:    Patient ID: Amy Hunter, female    DOB: [birth year], 52 y.o.   MRN: [id number]  HPI The patient is a 52 year old female who been asked to see for dyspnea.  She notes the onset of shortness of breath approximately 2 months ago, and has not had progressive symptoms.  She describes a 1-2 block dyspnea on exertion at a moderate pace, but does not get winded bringing groceries in from the car, or doing light housework.  She feels the most shortness of breath when she is sitting down quietly, and feels that she is unable to get a deep breath.  The patient is morbidly obese, and has gained 50-60 pounds over the last 2 years.  She has had a CT of her chest that shows mild atelectasis at the lung bases, but otherwise no parenchymal lung disease.  There was no evidence for thromboembolic disease.  She has had pulmonary function studies that showed no obstruction, no restriction, and a normal diffusion capacity.  She has had an echocardiogram that showed a normal ejection fraction, left ventricular hypertrophy was present with diastolic dysfunction, a normal right ventricle, and no evidence for pulmonary hypertension.  She has had lower extremity venous Dopplers that did not demonstrate DVT.  The patient denies any cough or mucus production, but has had worsening lower extremity edema in both legs.  Of note, she has had venous procedures in both lower extremities.   Review of Systems  Constitutional: Negative for fever and unexpected weight change.  HENT: Positive for congestion. Negative for ear pain, nosebleeds, sore throat, rhinorrhea, sneezing, trouble swallowing, dental problem, postnasal drip and sinus pressure.   Eyes: Negative for redness and itching.  Respiratory: Positive for shortness of breath. Negative for cough, chest tightness and wheezing.   Cardiovascular: Negative for palpitations and leg swelling.  Gastrointestinal: Negative for nausea and vomiting.  Genitourinary: Negative  for dysuria.  Musculoskeletal: Positive for joint swelling and arthralgias.  Skin: Negative for rash.  Neurological: Negative for headaches.  Hematological: Does not bruise/bleed easily.  Psychiatric/Behavioral: Negative for dysphoric mood. The patient is nervous/anxious.        Objective:   Physical Exam Constitutional:  Morbidly obese female, no acute distress  HENT:  Nares patent without discharge, but narrowed bilat L >>R  Oropharynx without exudate, palate and uvula are thickened and elongated.   Eyes:  Perrla, eomi, no scleral icterus  Neck:  No JVD, no TMG  Cardiovascular:  Normal rate, regular rhythm, no rubs or gallops.  No murmurs        Intact distal pulses but decreased  Pulmonary : small depth of inspiration, no stridor or respiratory distress   No rales, rhonchi, or wheezing  Abdominal:  Soft, nondistended, bowel sounds present.  No tenderness noted.   Musculoskeletal: 2+ lower extremity edema noted, R>>L  Lymph Nodes:  No cervical lymphadenopathy noted  Skin:  No cyanosis noted  Neurologic:  Alert, appropriate, moves all 4 extremities without obvious deficit.         Assessment & Plan:

## 2012-05-31 NOTE — Patient Instructions (Addendum)
I have reviewed all of your pulmonary testing, I am not able to find a lung issue which is causing your shortness of breath.  I believe it is due to your weight, conditioning, fluid balance.  Treatment of your sleep apnea may help your feeling of an inablity to take a deep breath.   Work on weight loss and conditioning. followup with me as needed.

## 2012-05-31 NOTE — Assessment & Plan Note (Signed)
The patient has dyspnea on exertion with heavier exertional activities, but she especially notes shortness of breath at rest.  She describes this as an inability to take a deep breath, and I suspect this is secondary to her centripetal obesity.  I have reviewed all of her testing today, and there is no evidence for COPD or asthma, no thromboembolic disease, and no left ventricular dysfunction.  She does have some diastolic dysfunction by echocardiogram, and has been having worsening lower extremity edema recently.  I suspect her shortness of breath is secondary to her centripetal obesity, diastolic dysfunction with some volume overload, and transient nocturnal hypoxemia related to her obstructive sleep apnea.  I really do not see an underlying pulmonary issue here, and would suggest that she work aggressively on weight loss, be given a trial of aggressive diuresis, and finally to have her sleep apnea treated as well.  We could consider doing a cardiopulmonary exercise test, however I think it is unlikely that it would reveal any occult cardiopulmonary disease.

## 2012-06-24 ENCOUNTER — Ambulatory Visit (INDEPENDENT_AMBULATORY_CARE_PROVIDER_SITE_OTHER): Payer: 59 | Admitting: Otolaryngology

## 2012-06-24 DIAGNOSIS — J343 Hypertrophy of nasal turbinates: Secondary | ICD-10-CM

## 2012-06-24 DIAGNOSIS — J310 Chronic rhinitis: Secondary | ICD-10-CM

## 2012-08-30 ENCOUNTER — Telehealth: Payer: Self-pay | Admitting: Nurse Practitioner

## 2012-08-30 NOTE — Telephone Encounter (Signed)
error 

## 2012-09-20 ENCOUNTER — Other Ambulatory Visit: Payer: Self-pay | Admitting: Family Medicine

## 2012-10-14 ENCOUNTER — Encounter: Payer: Self-pay | Admitting: Family Medicine

## 2012-10-14 ENCOUNTER — Ambulatory Visit (INDEPENDENT_AMBULATORY_CARE_PROVIDER_SITE_OTHER): Payer: 59 | Admitting: Family Medicine

## 2012-10-14 VITALS — BP 110/72 | Ht 66.0 in | Wt 314.0 lb

## 2012-10-14 DIAGNOSIS — I10 Essential (primary) hypertension: Secondary | ICD-10-CM

## 2012-10-14 DIAGNOSIS — E119 Type 2 diabetes mellitus without complications: Secondary | ICD-10-CM

## 2012-10-14 DIAGNOSIS — L02612 Cutaneous abscess of left foot: Secondary | ICD-10-CM

## 2012-10-14 DIAGNOSIS — L02619 Cutaneous abscess of unspecified foot: Secondary | ICD-10-CM

## 2012-10-14 DIAGNOSIS — L600 Ingrowing nail: Secondary | ICD-10-CM

## 2012-10-14 DIAGNOSIS — Z23 Encounter for immunization: Secondary | ICD-10-CM

## 2012-10-14 MED ORDER — DOXYCYCLINE HYCLATE 100 MG PO CAPS: 100.0000 mg | ORAL_CAPSULE | Freq: Two times a day (BID) | ORAL | Status: DC

## 2012-10-14 NOTE — Progress Notes (Signed)
  Subjective:    Patient ID: Amy Hunter, female    DOB: 15-Jun-1960, 52 y.o.   MRN: [id number]  HPIPatient states she has a sensation in her back that feels like little bee stings. Started a couple of weeks ago.   Bumps coming up on her face, arms, legs, and back.  Left great toe pain for about 1 week.  It is draining pus. Patient has multiple small spots that look like folliculitis. In addition to this he has an ingrown toenail that has an abscess on the medial portion. Patient is a diabetic has not had a check up recently for diabetes. Denies high fever chills denies any significant back pain   Review of Systems See above    Objective:   Physical Exam See above lungs clear heart regular       Assessment & Plan:  Folliculitis-doxycycline twice a day 10 days proper way to take it was discussed Foot/toe infection-this was injected with 1% lidocaine a 1 cm cut was made into it patient was told that she needed to be referred to podiatry to have the toenail removed.

## 2012-10-17 LAB — WOUND CULTURE
Gram Stain: NONE SEEN
Organism ID, Bacteria: NO GROWTH

## 2012-11-03 ENCOUNTER — Other Ambulatory Visit: Payer: Self-pay | Admitting: Family Medicine

## 2012-11-03 LAB — BASIC METABOLIC PANEL
BUN: 19 mg/dL (ref 6–23)
Chloride: 101 mEq/L (ref 96–112)
Creat: 0.73 mg/dL (ref 0.50–1.10)

## 2012-11-03 LAB — HEPATIC FUNCTION PANEL
ALT: 25 U/L (ref 0–35)
Alkaline Phosphatase: 171 U/L — ABNORMAL HIGH (ref 39–117)
Indirect Bilirubin: 0.4 mg/dL (ref 0.0–0.9)
Total Protein: 6.8 g/dL (ref 6.0–8.3)

## 2012-11-03 LAB — LIPID PANEL
Cholesterol: 128 mg/dL (ref 0–200)
LDL Cholesterol: 65 mg/dL (ref 0–99)
Triglycerides: 126 mg/dL (ref <150)

## 2012-11-03 LAB — HEMOGLOBIN A1C: Mean Plasma Glucose: 117 mg/dL — ABNORMAL HIGH (ref <117)

## 2012-11-09 ENCOUNTER — Ambulatory Visit: Payer: 59 | Admitting: Family Medicine

## 2012-11-10 ENCOUNTER — Encounter: Payer: Self-pay | Admitting: Family Medicine

## 2012-11-10 ENCOUNTER — Ambulatory Visit (INDEPENDENT_AMBULATORY_CARE_PROVIDER_SITE_OTHER): Payer: 59 | Admitting: Family Medicine

## 2012-11-10 VITALS — BP 122/78 | Ht 66.0 in | Wt 315.0 lb

## 2012-11-10 DIAGNOSIS — E118 Type 2 diabetes mellitus with unspecified complications: Secondary | ICD-10-CM

## 2012-11-10 DIAGNOSIS — E039 Hypothyroidism, unspecified: Secondary | ICD-10-CM | POA: Insufficient documentation

## 2012-11-10 DIAGNOSIS — E785 Hyperlipidemia, unspecified: Secondary | ICD-10-CM | POA: Insufficient documentation

## 2012-11-10 NOTE — Progress Notes (Signed)
  Subjective:    Patient ID: Amy Hunter, female    DOB: 12/26/1960, 52 y.o.   MRN: [id number]  HPI Patient arrives for a diabetic check up and to discuss labs. The patient was seen today as part of a comprehensive diabetic check up. The patient had the following elements completed: -Review of medication compliance -Review of glucose monitoring results -Review of any complications do to high or low sugars -Diabetic foot exam was completed as part of today's visit. The following was also discussed: -Importance of yearly eye exams -Importance of following diabetic/low sugar-starch diet -Importance of exercise and regular activity -Importance of regular followup visits. -Most recent hemoglobin A1c were reviewed with the patient along with goals regarding diabetes. Patient has a history of obesity sleep apnea hypertension and hypothyroidism. Plus hyperlipidemia Family history diabetes Patient does not smoke  Review of Systems Denies excessive thirst blurred vision denies shortness of breath chest tightness pressure pain denies swelling in the legs.    Objective:   Physical Exam Neck no masses lungs clear heart regular abdomen soft obese extremities no edema skin warm dry      Assessment & Plan:  Advised colonoscopy Flu shot-has R. he had Diabetes very good control continue current measures recheck A1c in about 5 months  Hyperlipidemia very good control continue current measures  Encourage exercise diet activity to try to keep weight down as best as possible Diabetic foot care discussed. Diabetic eye care discussed. Patient has worries about her husband. She will schedule an appointment for him.

## 2012-11-16 DIAGNOSIS — Z0289 Encounter for other administrative examinations: Secondary | ICD-10-CM

## 2012-12-03 ENCOUNTER — Other Ambulatory Visit: Payer: Self-pay | Admitting: Family Medicine

## 2012-12-03 DIAGNOSIS — Z139 Encounter for screening, unspecified: Secondary | ICD-10-CM

## 2012-12-09 ENCOUNTER — Other Ambulatory Visit: Payer: Self-pay | Admitting: Family Medicine

## 2012-12-09 ENCOUNTER — Ambulatory Visit (HOSPITAL_COMMUNITY): Payer: 59

## 2012-12-10 ENCOUNTER — Ambulatory Visit (HOSPITAL_COMMUNITY): Payer: 59

## 2013-01-03 ENCOUNTER — Telehealth: Payer: Self-pay | Admitting: Family Medicine

## 2013-01-03 ENCOUNTER — Encounter: Payer: Self-pay | Admitting: Family Medicine

## 2013-01-03 NOTE — Telephone Encounter (Signed)
She may have WE for today and any other day given this situation . Thanks

## 2013-01-03 NOTE — Telephone Encounter (Signed)
Patient is currently at the hospital due to her husband Chanetta Marshall) being hospitalized. She would like a work excuse for today. She says she is under FMLA, but would like work excuse just in case. I told her since she is not at our office I would have to check with the doctor.

## 2013-01-03 NOTE — Telephone Encounter (Signed)
Notified patient that work excuse is up front for pick up.

## 2013-01-20 ENCOUNTER — Other Ambulatory Visit: Payer: Self-pay | Admitting: Family Medicine

## 2013-02-04 ENCOUNTER — Other Ambulatory Visit: Payer: Self-pay | Admitting: Family Medicine

## 2013-04-04 ENCOUNTER — Other Ambulatory Visit: Payer: Self-pay | Admitting: Adult Health

## 2013-04-04 ENCOUNTER — Other Ambulatory Visit: Payer: Self-pay | Admitting: Family Medicine

## 2013-04-12 ENCOUNTER — Ambulatory Visit (INDEPENDENT_AMBULATORY_CARE_PROVIDER_SITE_OTHER): Payer: 59 | Admitting: Family Medicine

## 2013-04-12 ENCOUNTER — Encounter: Payer: Self-pay | Admitting: Family Medicine

## 2013-04-12 VITALS — BP 136/82 | Ht 66.0 in | Wt 329.0 lb

## 2013-04-12 DIAGNOSIS — M129 Arthropathy, unspecified: Secondary | ICD-10-CM

## 2013-04-12 DIAGNOSIS — M79605 Pain in left leg: Secondary | ICD-10-CM

## 2013-04-12 DIAGNOSIS — M79609 Pain in unspecified limb: Secondary | ICD-10-CM

## 2013-04-12 DIAGNOSIS — M1990 Unspecified osteoarthritis, unspecified site: Secondary | ICD-10-CM

## 2013-04-12 MED ORDER — GLIPIZIDE 5 MG PO TABS: ORAL_TABLET | ORAL | Status: DC

## 2013-04-12 MED ORDER — NAPROXEN 500 MG PO TABS: 500.0000 mg | ORAL_TABLET | Freq: Two times a day (BID) | ORAL | Status: DC

## 2013-04-12 NOTE — Progress Notes (Signed)
   Subjective:    Patient ID: Amy Hunter, female    DOB: [birth year], 53 y.o.   MRN: [id number]  Leg Pain  Incident onset: Chronic, but worst in the past couple of days. There was no injury mechanism. The pain is present in the left leg. Quality: Dull. The pain has been intermittent (Not hurting now) since onset. She reports no foreign bodies present. The symptoms are aggravated by weight bearing. She has tried acetaminophen and rest for the symptoms. The treatment provided mild relief.   PMH benign, obesity   Review of Systems Denies shortness of breath relates knee pain shin pain denies calf pain    Objective:   Physical Exam  Lungs clear heart regular no unilateral edema. There is no calf tenderness. Psoriasis noted. No cellulitis noted. Crepitus noted in both knees. Some tenderness left knee and left ear anterior shin      Assessment & Plan:  Left leg pain probably associated with arthritis recommend and anti-inflammatories as directed. If not doing better over the course of next several weeks referral to orthopedist. Patient agrees to followup. Call us if any other problems. No sign of DVT or cellulitis currently if unilateral swelling redness or severe pain or fever followup immediately

## 2013-04-15 ENCOUNTER — Telehealth: Payer: Self-pay | Admitting: Family Medicine

## 2013-04-15 LAB — RHEUMATOID FACTOR: Rhuematoid fact SerPl-aCnc: 63 IU/mL — ABNORMAL HIGH (ref <=14)

## 2013-04-15 LAB — SEDIMENTATION RATE: SED RATE: 20 mm/h (ref 0–22)

## 2013-04-15 MED ORDER — ONDANSETRON HCL 8 MG PO TABS: 8.0000 mg | ORAL_TABLET | Freq: Three times a day (TID) | ORAL | Status: DC | PRN

## 2013-04-15 NOTE — Telephone Encounter (Signed)
May send in Zofran 8 mg one q8 hours  Prn, #20 3 refills

## 2013-04-15 NOTE — Telephone Encounter (Signed)
Patient said that the medication prescribed for her leg is helping, but she would like something called in for nausea.  [facility]

## 2013-04-15 NOTE — Telephone Encounter (Signed)
Medication sent to pharmacy. Patient was notified.  

## 2013-04-27 ENCOUNTER — Other Ambulatory Visit: Payer: Self-pay

## 2013-04-27 DIAGNOSIS — R768 Other specified abnormal immunological findings in serum: Secondary | ICD-10-CM

## 2013-05-05 ENCOUNTER — Telehealth: Payer: Self-pay | Admitting: Family Medicine

## 2013-05-05 NOTE — Telephone Encounter (Signed)
Patient wants to know if she needs to come back to our office again to discuss her arthritis or can she wait until the referral to see the specialist?

## 2013-05-05 NOTE — Telephone Encounter (Signed)
[facility] to get more info 

## 2013-05-06 NOTE — Telephone Encounter (Signed)
Hey, I see there is a referral for rheumatology. Is that the correct referral for her arthritis?

## 2013-05-10 ENCOUNTER — Ambulatory Visit: Payer: 59 | Admitting: Family Medicine

## 2013-05-18 NOTE — Telephone Encounter (Signed)
Yes that's correct, referral faxed, awaiting appointment information

## 2013-05-26 ENCOUNTER — Ambulatory Visit (HOSPITAL_COMMUNITY)
Admission: RE | Admit: 2013-05-26 | Discharge: 2013-05-26 | Disposition: A | Discharge status: Home / Self Care | Payer: 59 | Source: Ambulatory Visit | Attending: Family Medicine | Admitting: Family Medicine

## 2013-05-26 ENCOUNTER — Ambulatory Visit (HOSPITAL_COMMUNITY): Payer: 59

## 2013-05-26 DIAGNOSIS — Z139 Encounter for screening, unspecified: Secondary | ICD-10-CM

## 2013-05-26 DIAGNOSIS — Z1231 Encounter for screening mammogram for malignant neoplasm of breast: Secondary | ICD-10-CM | POA: Insufficient documentation

## 2013-05-30 ENCOUNTER — Ambulatory Visit (INDEPENDENT_AMBULATORY_CARE_PROVIDER_SITE_OTHER): Payer: 59 | Admitting: Family Medicine

## 2013-05-30 ENCOUNTER — Encounter: Payer: Self-pay | Admitting: Family Medicine

## 2013-05-30 ENCOUNTER — Other Ambulatory Visit: Payer: Self-pay | Admitting: Family Medicine

## 2013-05-30 VITALS — BP 118/80 | Temp 98.5°F | Ht 66.0 in | Wt 325.0 lb

## 2013-05-30 DIAGNOSIS — E785 Hyperlipidemia, unspecified: Secondary | ICD-10-CM

## 2013-05-30 DIAGNOSIS — Z79899 Other long term (current) drug therapy: Secondary | ICD-10-CM

## 2013-05-30 DIAGNOSIS — E118 Type 2 diabetes mellitus with unspecified complications: Secondary | ICD-10-CM

## 2013-05-30 DIAGNOSIS — E039 Hypothyroidism, unspecified: Secondary | ICD-10-CM

## 2013-05-30 DIAGNOSIS — R748 Abnormal levels of other serum enzymes: Secondary | ICD-10-CM

## 2013-05-30 MED ORDER — MOMETASONE FUROATE 0.1 % EX CREA: 1.0000 "application " | TOPICAL_CREAM | Freq: Two times a day (BID) | CUTANEOUS | Status: DC | PRN

## 2013-05-30 NOTE — Progress Notes (Signed)
   Subjective:    Patient ID: Amy Hunter, female    DOB: 1960-07-07, 53 y.o.   MRN: [id number]  Rash This is a new problem. The current episode started in the past 7 days. Pain location: chest, back, face, arms. The rash is characterized by itchiness, dryness and redness. It is unknown if there was an exposure to a precipitant. Treatments tried: eucerin lotion, aveeno lotion and soap, gold bond lotion. The treatment provided mild relief. Her past medical history is significant for eczema.   we talked about the importance of her continuing her medication we talked about the importance of cholesterol control diabetes control watching diet keeping thyroid under good control as well. Will start of the importance of intermittent testing.    Review of Systems  Skin: Positive for rash.   she denied chest pain denied nausea vomiting diarrhea denies sweats she states her energy level is fair. She's trying to lose weight having a difficult time doing so.     Objective:   Physical Exam Foot exam reveals dry feet with calluses. Pulses are normal some mild flattening in the feet and deformity. Lungs clear hearts regular Abdomen obese extremities trace edema Has contact dermatitis on her upper chest and on her upper back and some on her face this appears to be worse on exposure occurred       Assessment & Plan:  1. Diabetes mellitus type 2, controlled, with complications This patient's diabetes under fairly decent control but she is due for some testing. So therefore we will go ahead with hemoglobin A1c. She was encouraged to watch diet exercise try to bring weight down - Hemoglobin A1c - Microalbumin, urine  2. Hyperlipidemia She is due to have her cholesterol panel checked she is to watch her diet. We'll try to keep LDL below 100. - Lipid panel  3. Hypothyroidism We'll also continue thyroid medicine on a regular basis she needs to get her lab test of at least once yearly.  4. Encounter  for long-term (current) use of other medications Patient is due to look at liver function as well as kidney function. - Hepatic function panel - Basic metabolic panel  Atopic dermatitis-Elocon cream as directed. Patient will be seen rheumatologist in the near future I told her it would be wise for her to discuss with the rheumatologist whether or not there is any underlying arthritis going on that could be associated with this rash

## 2013-05-31 ENCOUNTER — Other Ambulatory Visit: Payer: Self-pay | Admitting: *Deleted

## 2013-05-31 DIAGNOSIS — E039 Hypothyroidism, unspecified: Secondary | ICD-10-CM

## 2013-06-09 ENCOUNTER — Telehealth: Payer: Self-pay | Admitting: Family Medicine

## 2013-06-09 MED ORDER — PRAVASTATIN SODIUM 20 MG PO TABS: ORAL_TABLET | ORAL | Status: DC

## 2013-06-09 NOTE — Telephone Encounter (Signed)
done

## 2013-06-09 NOTE — Telephone Encounter (Signed)
Patient needs Rx for pravastatin NA 20 mg tab

## 2013-06-14 LAB — BASIC METABOLIC PANEL
BUN: 22 mg/dL (ref 6–23)
CO2: 27 mEq/L (ref 19–32)
Calcium: 8.6 mg/dL (ref 8.4–10.5)
Chloride: 106 mEq/L (ref 96–112)
Creat: 0.71 mg/dL (ref 0.50–1.10)
Glucose, Bld: 102 mg/dL — ABNORMAL HIGH (ref 70–99)
POTASSIUM: 4.2 meq/L (ref 3.5–5.3)
SODIUM: 144 meq/L (ref 135–145)

## 2013-06-14 LAB — HEPATIC FUNCTION PANEL
ALK PHOS: 193 U/L — AB (ref 39–117)
ALT: 27 U/L (ref 0–35)
AST: 18 U/L (ref 0–37)
Albumin: 3.9 g/dL (ref 3.5–5.2)
BILIRUBIN TOTAL: 0.3 mg/dL (ref 0.2–1.2)
Bilirubin, Direct: 0.1 mg/dL (ref 0.0–0.3)
Indirect Bilirubin: 0.2 mg/dL (ref 0.2–1.2)
Total Protein: 6.5 g/dL (ref 6.0–8.3)

## 2013-06-14 LAB — LIPID PANEL
CHOL/HDL RATIO: 3.2 ratio
Cholesterol: 133 mg/dL (ref 0–200)
HDL: 41 mg/dL (ref >39)
LDL CALC: 66 mg/dL (ref 0–99)
Triglycerides: 128 mg/dL (ref <150)
VLDL: 26 mg/dL (ref 0–40)

## 2013-06-14 LAB — HEMOGLOBIN A1C
HEMOGLOBIN A1C: 6 % — AB (ref <5.7)
Mean Plasma Glucose: 126 mg/dL — ABNORMAL HIGH (ref <117)

## 2013-06-15 LAB — MICROALBUMIN, URINE: Microalb, Ur: 0.5 mg/dL (ref 0.00–1.89)

## 2013-06-20 ENCOUNTER — Other Ambulatory Visit: Payer: Self-pay | Admitting: Family Medicine

## 2013-06-22 NOTE — Addendum Note (Signed)
Addended by: Metro KungICHARDS, WENDY M on: 06/22/2013 09:27 AM   Modules accepted: Orders

## 2013-07-25 ENCOUNTER — Other Ambulatory Visit: Payer: Self-pay | Admitting: Family Medicine

## 2013-08-02 ENCOUNTER — Telehealth: Payer: Self-pay | Admitting: Family Medicine

## 2013-08-02 MED ORDER — TORSEMIDE 20 MG PO TABS: ORAL_TABLET | ORAL | Status: DC

## 2013-08-02 NOTE — Telephone Encounter (Signed)
Patient said that she is completely out of his torsemide (DEMADEX) 20 MG tablet and [facility]

## 2013-08-02 NOTE — Telephone Encounter (Signed)
Medication sent to pharmacy. Left message on voicemail notifying patient.  

## 2013-08-18 ENCOUNTER — Telehealth: Payer: Self-pay | Admitting: *Deleted

## 2013-08-18 NOTE — Telephone Encounter (Signed)
Pt had bloodwork on May 12th and was suppose to get a Gamma GT done. Pt states she did go to lab. [facility] states they never got speciman from pt. Pt also wants to know her bloodwork resutls from 05/12. She had lipid, liver, a1c and bmp.

## 2013-08-18 NOTE — Telephone Encounter (Signed)
Please see the lab results. According to these results it was discussed with the patient by the nurse back in May. Feel free to go over it again. She does need to do that test but also she has a thyroid function that was ordered back in April that was never completed either she ought to do both tests. If necessary put new orders into the system, patient should do these tests then followup in August. As we get the results we will call

## 2013-08-19 ENCOUNTER — Other Ambulatory Visit: Payer: Self-pay | Admitting: *Deleted

## 2013-08-19 DIAGNOSIS — E039 Hypothyroidism, unspecified: Secondary | ICD-10-CM

## 2013-08-19 DIAGNOSIS — R748 Abnormal levels of other serum enzymes: Secondary | ICD-10-CM

## 2013-08-19 NOTE — Telephone Encounter (Signed)
Discussed results with patient. New orders for gamma gt, tsh, and free t4 put in again for pt to go to lab. Orders up front for pt to pick up.

## 2013-09-15 ENCOUNTER — Other Ambulatory Visit: Payer: Self-pay | Admitting: Family Medicine

## 2013-09-19 ENCOUNTER — Other Ambulatory Visit: Payer: Self-pay | Admitting: Family Medicine

## 2013-10-24 ENCOUNTER — Other Ambulatory Visit: Payer: Self-pay | Admitting: Family Medicine

## 2013-11-23 ENCOUNTER — Other Ambulatory Visit: Payer: Self-pay | Admitting: Family Medicine

## 2013-12-08 ENCOUNTER — Other Ambulatory Visit: Payer: Self-pay | Admitting: Family Medicine

## 2014-01-03 ENCOUNTER — Telehealth: Payer: Self-pay

## 2014-01-03 ENCOUNTER — Ambulatory Visit (INDEPENDENT_AMBULATORY_CARE_PROVIDER_SITE_OTHER): Payer: 59 | Admitting: Family Medicine

## 2014-01-03 ENCOUNTER — Encounter: Payer: Self-pay | Admitting: Family Medicine

## 2014-01-03 ENCOUNTER — Ambulatory Visit (HOSPITAL_COMMUNITY)
Admission: RE | Admit: 2014-01-03 | Discharge: 2014-01-03 | Disposition: A | Discharge status: Home / Self Care | Payer: 59 | Source: Ambulatory Visit | Attending: Family Medicine | Admitting: Family Medicine

## 2014-01-03 VITALS — BP 130/74 | Ht 66.0 in | Wt 325.0 lb

## 2014-01-03 DIAGNOSIS — M79671 Pain in right foot: Secondary | ICD-10-CM | POA: Insufficient documentation

## 2014-01-03 DIAGNOSIS — M7989 Other specified soft tissue disorders: Secondary | ICD-10-CM | POA: Insufficient documentation

## 2014-01-03 MED ORDER — HYDROCODONE-ACETAMINOPHEN 7.5-325 MG PO TABS: 1.0000 | ORAL_TABLET | Freq: Four times a day (QID) | ORAL | Status: DC | PRN

## 2014-01-03 NOTE — Progress Notes (Signed)
   Subjective:    Patient ID: Amy Hunter, female    DOB: [birth year], 53 y.o.   MRN: [id number]  Foot Pain This is a new problem. The current episode started in the past 7 days. The symptoms are aggravated by walking and standing. Treatments tried: ibuprofen, epsom salt. The treatment provided no relief.    PMH benign significant obesity  Review of Systems Foot pain discomfort denies ankle pain denies calf pain no shortness of breath    Objective:   Physical Exam  Moderate tenderness lateral aspect of the foot ankle normal calf normal no swelling in the calf. Some swelling in the ankle foot noted. no redness      Assessment & Plan:  Ankle normal Moderate foot tenderness X-ray ordered Possible stress fracture Postop shoe recommended for the next 2 weeks

## 2014-01-03 NOTE — Telephone Encounter (Signed)
Please prepare work excuse for the rest of the week per Dr. Lorin PicketScott. Patient was notified.

## 2014-01-04 ENCOUNTER — Encounter: Payer: Self-pay | Admitting: Family Medicine

## 2014-01-04 NOTE — Telephone Encounter (Signed)
Done

## 2014-01-13 ENCOUNTER — Encounter: Payer: Self-pay | Admitting: Family Medicine

## 2014-01-25 ENCOUNTER — Telehealth: Payer: Self-pay

## 2014-01-25 ENCOUNTER — Other Ambulatory Visit: Payer: Self-pay | Admitting: *Deleted

## 2014-01-25 MED ORDER — BENZONATATE 200 MG PO CAPS: 200.0000 mg | ORAL_CAPSULE | Freq: Three times a day (TID) | ORAL | Status: DC | PRN

## 2014-01-25 NOTE — Telephone Encounter (Signed)
Med sent to pharm. Pt notified.  

## 2014-01-25 NOTE — Telephone Encounter (Signed)
[facility]

## 2014-01-25 NOTE — Telephone Encounter (Signed)
Pt called requesting something to be called in for a cough. She states that she may have caught something from her son.  [facility].

## 2014-01-25 NOTE — Telephone Encounter (Signed)
Tessalon 200mg  #21 one tid prn. 2 refills. Follow up if ongoing troubles

## 2014-01-25 NOTE — Telephone Encounter (Signed)
Dry hacking cough for the past 2 weeks. No fever. No wheezing. Worse during the day.

## 2014-01-26 ENCOUNTER — Other Ambulatory Visit: Payer: Self-pay | Admitting: Family Medicine

## 2014-02-02 ENCOUNTER — Telehealth: Payer: Self-pay | Admitting: Family Medicine

## 2014-02-02 NOTE — Telephone Encounter (Signed)
Patient has a bad cough and has tried tessalon pearls with no relief.  Is there something OTC that she can try?

## 2014-02-02 NOTE — Telephone Encounter (Signed)
Robitussin-DM or Delsym would be safe for her to use. Certainly if illness goes beyond a week or if it is associated with fever or shortness of breath she should be seen

## 2014-02-02 NOTE — Telephone Encounter (Signed)
Notified patient Amy Hunter or Delsym would be safe for her to use. Certainly if illness goes beyond a week or if it is associated with fever or shortness of breath she should be seen. Patient verbalized understanding.

## 2014-02-22 ENCOUNTER — Other Ambulatory Visit: Payer: Self-pay | Admitting: Family Medicine

## 2014-02-22 ENCOUNTER — Other Ambulatory Visit: Payer: Self-pay | Admitting: Adult Health

## 2014-03-09 ENCOUNTER — Other Ambulatory Visit: Payer: Self-pay | Admitting: Family Medicine

## 2014-03-16 ENCOUNTER — Other Ambulatory Visit: Payer: Self-pay | Admitting: Family Medicine

## 2014-03-23 ENCOUNTER — Other Ambulatory Visit: Payer: Self-pay | Admitting: *Deleted

## 2014-03-23 MED ORDER — LISINOPRIL 2.5 MG PO TABS: 2.5000 mg | ORAL_TABLET | Freq: Every morning | ORAL | Status: DC

## 2014-03-24 ENCOUNTER — Telehealth: Payer: Self-pay | Admitting: Family Medicine

## 2014-03-24 ENCOUNTER — Other Ambulatory Visit: Payer: Self-pay | Admitting: Family Medicine

## 2014-03-24 ENCOUNTER — Other Ambulatory Visit: Payer: Self-pay | Admitting: Adult Health

## 2014-03-24 NOTE — Telephone Encounter (Signed)
[facility] is to be sent the refill request for these meds,   They state they have not received them but out system shows they  Got one, resend that please. Pt has appt for mar 2   lisinopril (PRINIVIL,ZESTRIL) 2.5 MG tablet metoprolol succinate (TOPROL-XL) 50 MG 24 hr tablet

## 2014-03-24 NOTE — Telephone Encounter (Signed)
[facility] , but we do not manage her metoprolol, her cardiologist does)

## 2014-03-28 ENCOUNTER — Telehealth: Payer: Self-pay | Admitting: Family Medicine

## 2014-03-28 MED ORDER — METOPROLOL SUCCINATE ER 50 MG PO TB24: 50.0000 mg | ORAL_TABLET | Freq: Every day | ORAL | Status: DC

## 2014-03-28 NOTE — Telephone Encounter (Signed)
Refill sent to pharmacy. Left message on voicemail notifying patient that medication was sent to pharmacy and she needs an office visit before any further.

## 2014-03-28 NOTE — Telephone Encounter (Signed)
This is handled by cardiology but she is due office visit with them for refills so she want you to fill it for her

## 2014-03-28 NOTE — Telephone Encounter (Signed)
Requesting Rx for metoprolol succinate (TOPROL-XL) 50 MG 24 hr tablet  [facility]

## 2014-03-28 NOTE — Telephone Encounter (Signed)
I am fine with us refilling it. She may have one refill of this medication with one additional refill. But guess what?! She is due office visit with us for her diabetes. She needs to schedule this in March.

## 2014-04-05 ENCOUNTER — Ambulatory Visit (INDEPENDENT_AMBULATORY_CARE_PROVIDER_SITE_OTHER): Payer: 59 | Admitting: Family Medicine

## 2014-04-05 ENCOUNTER — Encounter: Payer: Self-pay | Admitting: Family Medicine

## 2014-04-05 VITALS — BP 110/72 | Ht 66.0 in

## 2014-04-05 DIAGNOSIS — E118 Type 2 diabetes mellitus with unspecified complications: Secondary | ICD-10-CM

## 2014-04-05 DIAGNOSIS — Z79899 Other long term (current) drug therapy: Secondary | ICD-10-CM | POA: Diagnosis not present

## 2014-04-05 DIAGNOSIS — E785 Hyperlipidemia, unspecified: Secondary | ICD-10-CM

## 2014-04-05 DIAGNOSIS — E119 Type 2 diabetes mellitus without complications: Secondary | ICD-10-CM

## 2014-04-05 DIAGNOSIS — E039 Hypothyroidism, unspecified: Secondary | ICD-10-CM | POA: Diagnosis not present

## 2014-04-05 DIAGNOSIS — Z23 Encounter for immunization: Secondary | ICD-10-CM

## 2014-04-05 LAB — POCT GLYCOSYLATED HEMOGLOBIN (HGB A1C): Hemoglobin A1C: 5.4

## 2014-04-05 NOTE — Progress Notes (Signed)
   Subjective:    Patient ID: Amy FerrySandra W Bagby, female    DOB: 1961/01/12, 54 y.o.   MRN: [id number]  Diabetes She presents for her follow-up diabetic visit. She has type 2 diabetes mellitus. Her disease course has been stable. There are no hypoglycemic associated symptoms. Pertinent negatives for hypoglycemia include no confusion. There are no diabetic associated symptoms. Pertinent negatives for diabetes include no chest pain, no fatigue, no polydipsia, no polyphagia and no weakness. There are no hypoglycemic complications. Symptoms are stable. There are no diabetic complications. There are no known risk factors for coronary artery disease. Current diabetic treatment includes oral agent (monotherapy). She is compliant with treatment all of the time.   Patient needs a refill on her Xanax, Elocon cream and Flonase. Patient states she has no other concerns at this time.    Review of Systems  Constitutional: Negative for activity change, appetite change and fatigue.  HENT: Negative for congestion.   Respiratory: Negative for cough.   Cardiovascular: Negative for chest pain.  Gastrointestinal: Negative for abdominal pain.  Endocrine: Negative for polydipsia and polyphagia.  Neurological: Negative for weakness.  Psychiatric/Behavioral: Negative for confusion.       Objective:   Physical Exam  Constitutional: She appears well-nourished. No distress.  Cardiovascular: Normal rate, regular rhythm and normal heart sounds.   No murmur heard. Pulmonary/Chest: Effort normal and breath sounds normal. No respiratory distress.  Musculoskeletal: She exhibits no edema.  Lymphadenopathy:    She has no cervical adenopathy.  Neurological: She is alert. She exhibits normal muscle tone.  Psychiatric: Her behavior is normal.  Vitals reviewed.         Assessment & Plan:  1. Type 2 diabetes mellitus without complication Her diabetes under very good control. She can reduce her medications, use  glipizide half tablet in the morning half tablet in the evening let us know if sugar readings become low or high - POCT glycosylated hemoglobin (Hb A1C)  2. Diabetes mellitus type 2, controlled, with complications See above check your micro-protein - Hemoglobin A1c - Microalbumin, urine  3. Hypothyroidism, unspecified hypothyroidism type She is taking her medicine denies any problem we will check TSH to monitor her tests - TSH  4. Hyperlipidemia She'll watch fats in her diet. She is to take her medication watch her diet we'll check lipid profile - Lipid panel  5. Morbid obesity She was encourage watching portions try to lose weight she has a lifelong history of morbid obesity  6. High risk medication use Because of the pravastatin check liver function. Because of diuretic check metabolic 7 - Hepatic function panel - Basic metabolic panel  7. Need for vaccination Pneumonia vaccine today. - Pneumococcal polysaccharide vaccine 23-valent greater than or equal to 2yo subcutaneous/IM 25 minutes spent with patient handling multiple problems

## 2014-04-11 ENCOUNTER — Other Ambulatory Visit: Payer: Self-pay | Admitting: Family Medicine

## 2014-04-15 LAB — BASIC METABOLIC PANEL
BUN/Creatinine Ratio: 27 — ABNORMAL HIGH (ref 9–23)
BUN: 19 mg/dL (ref 6–24)
CO2: 25 mmol/L (ref 18–29)
Calcium: 9 mg/dL (ref 8.7–10.2)
Chloride: 105 mmol/L (ref 97–108)
Creatinine, Ser: 0.71 mg/dL (ref 0.57–1.00)
GFR calc Af Amer: 112 mL/min/{1.73_m2} (ref >59)
GFR calc non Af Amer: 97 mL/min/{1.73_m2} (ref >59)
Glucose: 79 mg/dL (ref 65–99)
POTASSIUM: 4.5 mmol/L (ref 3.5–5.2)
Sodium: 144 mmol/L (ref 134–144)

## 2014-04-15 LAB — HEPATIC FUNCTION PANEL
ALT: 24 IU/L (ref 0–32)
AST: 20 IU/L (ref 0–40)
Albumin: 4 g/dL (ref 3.5–5.5)
Alkaline Phosphatase: 150 IU/L — ABNORMAL HIGH (ref 39–117)
Bilirubin Total: 0.3 mg/dL (ref 0.0–1.2)
Bilirubin, Direct: 0.08 mg/dL (ref 0.00–0.40)
TOTAL PROTEIN: 6.9 g/dL (ref 6.0–8.5)

## 2014-04-15 LAB — LIPID PANEL
Chol/HDL Ratio: 3.5 ratio units (ref 0.0–4.4)
Cholesterol, Total: 142 mg/dL (ref 100–199)
HDL: 41 mg/dL (ref >39)
LDL Calculated: 70 mg/dL (ref 0–99)
Triglycerides: 153 mg/dL — ABNORMAL HIGH (ref 0–149)
VLDL Cholesterol Cal: 31 mg/dL (ref 5–40)

## 2014-04-15 LAB — TSH: TSH: 2.49 u[IU]/mL (ref 0.450–4.500)

## 2014-04-15 LAB — HEMOGLOBIN A1C
Est. average glucose Bld gHb Est-mCnc: 123 mg/dL
Hgb A1c MFr Bld: 5.9 % — ABNORMAL HIGH (ref 4.8–5.6)

## 2014-04-15 LAB — MICROALBUMIN, URINE

## 2014-04-16 ENCOUNTER — Encounter: Payer: Self-pay | Admitting: Family Medicine

## 2014-04-17 ENCOUNTER — Other Ambulatory Visit: Payer: Self-pay | Admitting: Family Medicine

## 2014-04-24 ENCOUNTER — Other Ambulatory Visit: Payer: Self-pay | Admitting: Family Medicine

## 2014-05-16 ENCOUNTER — Other Ambulatory Visit: Payer: Self-pay | Admitting: Family Medicine

## 2014-06-08 ENCOUNTER — Telehealth: Payer: Self-pay | Admitting: Family Medicine

## 2014-06-08 MED ORDER — SULFACETAMIDE SODIUM 10 % OP SOLN: OPHTHALMIC | Status: DC

## 2014-06-08 NOTE — Telephone Encounter (Signed)
Patient states her right eye is red, gritty feeling and matting at times. Sent in sulfacetamide drops per protocol.

## 2014-06-08 NOTE — Telephone Encounter (Signed)
Pt called wanting something called in for her eye. It is really red and feels gritty.  [facility] [place]

## 2014-06-22 ENCOUNTER — Other Ambulatory Visit: Payer: Self-pay | Admitting: Family Medicine

## 2014-06-29 ENCOUNTER — Other Ambulatory Visit: Payer: Self-pay | Admitting: Family Medicine

## 2014-09-04 ENCOUNTER — Telehealth: Payer: Self-pay | Admitting: Family Medicine

## 2014-09-04 MED ORDER — GLIPIZIDE 5 MG PO TABS: ORAL_TABLET | ORAL | Status: DC

## 2014-09-04 NOTE — Telephone Encounter (Signed)
Last diabetic check up 04/05/14

## 2014-09-04 NOTE — Telephone Encounter (Signed)
Rx sent electronically to pharmacy. Patient notified. 

## 2014-09-04 NOTE — Telephone Encounter (Signed)
Pt is out of her glipizide and is needing refills sent over to [facility].

## 2014-09-04 NOTE — Telephone Encounter (Signed)
Refill times 2 sched diabetic check up by Sept

## 2014-09-25 ENCOUNTER — Other Ambulatory Visit: Payer: Self-pay | Admitting: Family Medicine

## 2014-09-27 ENCOUNTER — Other Ambulatory Visit: Payer: Self-pay | Admitting: Family Medicine

## 2014-09-27 DIAGNOSIS — Z1231 Encounter for screening mammogram for malignant neoplasm of breast: Secondary | ICD-10-CM

## 2014-10-06 ENCOUNTER — Telehealth: Payer: Self-pay | Admitting: Family Medicine

## 2014-10-06 MED ORDER — KETOCONAZOLE 2 % EX CREA: 1.0000 "application " | TOPICAL_CREAM | Freq: Two times a day (BID) | CUTANEOUS | Status: DC

## 2014-10-06 MED ORDER — CEPHALEXIN 500 MG PO CAPS: 500.0000 mg | ORAL_CAPSULE | Freq: Three times a day (TID) | ORAL | Status: DC

## 2014-10-06 NOTE — Telephone Encounter (Signed)
Swollen red with pus is not just fungal. ketocon cr 30 g bid. Plus keflex 500 tid ten d

## 2014-10-06 NOTE — Telephone Encounter (Signed)
Rx sent electronically to pharmacy. Patient notified. 

## 2014-10-06 NOTE — Telephone Encounter (Signed)
Pt calling from work with her right big toe swollen, sore w/puss visible. She would like  To know if you could call in a fungus cream for her to use since she can not leave  Today due to [facility] being at the hospital currently.   [facility] [place]

## 2014-10-24 ENCOUNTER — Other Ambulatory Visit: Payer: Self-pay | Admitting: *Deleted

## 2014-10-24 MED ORDER — PRAVASTATIN SODIUM 20 MG PO TABS: 20.0000 mg | ORAL_TABLET | Freq: Every evening | ORAL | Status: DC

## 2014-10-24 MED ORDER — LISINOPRIL 2.5 MG PO TABS: 2.5000 mg | ORAL_TABLET | Freq: Every morning | ORAL | Status: DC

## 2014-10-24 MED ORDER — METOPROLOL SUCCINATE ER 50 MG PO TB24: ORAL_TABLET | ORAL | Status: DC

## 2014-10-30 ENCOUNTER — Other Ambulatory Visit: Payer: Self-pay | Admitting: Family Medicine

## 2014-10-31 ENCOUNTER — Ambulatory Visit (INDEPENDENT_AMBULATORY_CARE_PROVIDER_SITE_OTHER): Payer: 59 | Admitting: Family Medicine

## 2014-10-31 VITALS — Temp 98.1°F | Ht 66.0 in | Wt 354.5 lb

## 2014-10-31 DIAGNOSIS — J019 Acute sinusitis, unspecified: Secondary | ICD-10-CM | POA: Diagnosis not present

## 2014-10-31 DIAGNOSIS — B9689 Other specified bacterial agents as the cause of diseases classified elsewhere: Secondary | ICD-10-CM

## 2014-10-31 MED ORDER — CEFPROZIL 500 MG PO TABS: 500.0000 mg | ORAL_TABLET | Freq: Two times a day (BID) | ORAL | Status: DC

## 2014-10-31 NOTE — Progress Notes (Signed)
   Subjective:    Patient ID: Amy Hunter, female    DOB: [birth year], 54 y.o.   MRN: [id number]  Cough This is a new problem. The current episode started in the past 7 days. The problem has been gradually worsening. The problem occurs every few minutes. The cough is non-productive. Associated symptoms include ear pain, headaches, rhinorrhea and a sore throat. Nothing aggravates the symptoms.   Patient states no concerns this visit. Patient states started off with a head cold about a week ago started getting better than sinus pain discomfort drainage and coughing  Review of Systems  HENT: Positive for ear pain, rhinorrhea and sore throat.   Respiratory: Positive for cough.   Neurological: Positive for headaches.       Objective:   Physical Exam  Constitutional: She appears well-developed.  HENT:  Head: Normocephalic.  Nose: Nose normal.  Mouth/Throat: Oropharynx is clear and moist. No oropharyngeal exudate.  Neck: Neck supple.  Cardiovascular: Normal rate and normal heart sounds.   No murmur heard. Pulmonary/Chest: Effort normal and breath sounds normal. She has no wheezes.  Lymphadenopathy:    She has no cervical adenopathy.  Skin: Skin is warm and dry.  Nursing note and vitals reviewed.         Assessment & Plan:  Viral syndrome secondary sinusitis no sign and pneumonia antibiotics prescribed warning signs discussed follow-up if problems regular follow-up regarding medications recommended

## 2014-11-01 ENCOUNTER — Ambulatory Visit: Payer: 59 | Admitting: Family Medicine

## 2014-11-01 ENCOUNTER — Ambulatory Visit (HOSPITAL_COMMUNITY): Payer: 59

## 2014-11-09 ENCOUNTER — Encounter: Payer: Self-pay | Admitting: Family Medicine

## 2014-11-09 ENCOUNTER — Ambulatory Visit (INDEPENDENT_AMBULATORY_CARE_PROVIDER_SITE_OTHER): Payer: 59 | Admitting: Family Medicine

## 2014-11-09 VITALS — BP 132/82 | Ht 66.0 in | Wt 351.2 lb

## 2014-11-09 DIAGNOSIS — E119 Type 2 diabetes mellitus without complications: Secondary | ICD-10-CM

## 2014-11-09 LAB — POCT GLYCOSYLATED HEMOGLOBIN (HGB A1C): Hemoglobin A1C: 5

## 2014-11-09 MED ORDER — LIRAGLUTIDE 18 MG/3ML ~~LOC~~ SOPN: PEN_INJECTOR | SUBCUTANEOUS | Status: DC

## 2014-11-09 NOTE — Progress Notes (Signed)
   Subjective:    Patient ID: Amy Hunter, female    DOB: 09/14/1960, 54 y.o.   MRN: [id number]  Diabetes She presents for her follow-up diabetic visit. She has type 2 diabetes mellitus. Pertinent negatives for hypoglycemia include no confusion. Pertinent negatives for diabetes include no chest pain, no fatigue, no polydipsia, no polyphagia and no weakness. Risk factors for coronary artery disease include diabetes mellitus, dyslipidemia and hypertension. Current diabetic treatment includes oral agent (monotherapy). She is compliant with treatment all of the time. Her weight is stable. She is following a diabetic diet. She has not had a previous visit with a dietitian. She does not see a podiatrist.Eye exam is not current.   Patient has morbid obesity does try to watch how she eats is trying to help her lose weight through doing so but having difficult time   Review of Systems  Constitutional: Negative for activity change, appetite change and fatigue.  HENT: Negative for congestion.   Respiratory: Negative for cough.   Cardiovascular: Negative for chest pain.  Gastrointestinal: Negative for abdominal pain.  Endocrine: Negative for polydipsia and polyphagia.  Neurological: Negative for weakness.  Psychiatric/Behavioral: Negative for confusion.       Objective:   Physical Exam  Constitutional: She appears well-nourished. No distress.  Cardiovascular: Normal rate, regular rhythm and normal heart sounds.   No murmur heard. Pulmonary/Chest: Effort normal and breath sounds normal. No respiratory distress.  Musculoskeletal: She exhibits no edema.  Lymphadenopathy:    She has no cervical adenopathy.  Neurological: She is alert. She exhibits normal muscle tone.  Psychiatric: Her behavior is normal.  Vitals reviewed.         Assessment & Plan:  Morbid obesity patient encouraged to lose weight Diabetes great control but we will switch to Victoza to see if that will help keep her  diabetes under control help her lose weight stop glipizide Brother chronic measures are undertaken control no need for any type of lab work on this visit follow-up 3-4 months comprehensive lab work on follow-up  Patient will give [place] follow-up in one month we may need need to increase the dose of the medicine at that time.

## 2014-11-09 NOTE — Patient Instructions (Signed)
Dear Patient,  It has been recommended to you that you have a colonoscopy. It is your responsibility to carry through with this recommendation.   Did you realize that colon cancer is the second leading cancer killer in the [place]. One in every 20 adults will get colon cancer. If all adults would go through the recommended screening for colon cancer (getting a colonoscopy), then there would be a 60% reduction in the number of people dying from colon cancer.  Colon cancer just doesn't come out of the blue. It starts off as a small polyp which over time grows into a cancer. A colonoscopy can prevent cancer and in many cases detected when it is at a very treatable phase. Small colon cancers can have cure rates of 95%. Advanced colon cancer, which often occurs in people who do not do their screenings, have cure rates less than 20%. The risk of colon cancer advances with age. Most adults should have regular colonoscopies every 10 years starting at age 50. This recommendation can vary depending on a person's medical history.  Health-care laws now allow for you to call the gastroenterologist office directly in order to set yourself up for this very important tests. Today we have recommended to you that you do this test. This test may save your life. Failure to do this test puts you at risk for premature death from colon cancer. Do the right thing and schedule this test now.  Here as a list of specialists we recommend in the surrounding area. When you call their office let them know that you are a patient of our practice in your interested in doing a screening colonoscopy. They should assist you without problems. You will need the following information when you called them: 1-name of which Dr. you see, 2-your insurance information, 3-a list of medications that you currently take, 4-any allergies you have to medications.  [place] gastroenterologist Dr. Mike Rourk, Dr Sandi Fields   [place]  gastroenterologist   [phone]  Dr.Najeeb Rehman [facility]   [phone]  [place] [facility] (Dr. Perry, N, Stark, Brodie, Gesner, Jacobs and Pyrtle) [phone]  [facility] (Dr. Buscemi, Edwards, Hayes, [place],Outlaw,Schooler) [phone]  Each group of specialists has assured us that when you called them they will help you get your colonoscopy set up. Should you have problems or if the GI practice insist a referral be done please let us know. Be sure to call soon. Sincerely, [name] Hoskins, Dr Steve Luking, Dr.Scott Luking    

## 2014-11-17 ENCOUNTER — Telehealth: Payer: Self-pay | Admitting: Family Medicine

## 2014-11-17 NOTE — Telephone Encounter (Signed)
Pt calling to let you know that her new med  Liraglutide (VICTOZA) 18 MG/3ML SOPN  Is doing well and has not issues with it at all. If you  Need to speak with her or change the med in any way  Please call her at work today

## 2014-11-17 NOTE — Telephone Encounter (Signed)
Notified patient if possible would like the patient to check a few sugars both in the morning and before supper time over the next week or 2 send it to us. We may be able to increase the dose of the medication depending on what her readings are. Patient verbalized understanding.

## 2014-11-17 NOTE — Telephone Encounter (Signed)
If possible I would like the patient to check a few sugars both in the morning and before supper time over the next week or 2 send it to us. We may be able to increase the dose of the medication depending on what her readings are

## 2014-11-24 ENCOUNTER — Other Ambulatory Visit: Payer: Self-pay | Admitting: Family Medicine

## 2014-12-07 ENCOUNTER — Other Ambulatory Visit: Payer: Self-pay | Admitting: *Deleted

## 2014-12-07 ENCOUNTER — Telehealth: Payer: Self-pay | Admitting: Family Medicine

## 2014-12-07 MED ORDER — BENZONATATE 100 MG PO CAPS: 100.0000 mg | ORAL_CAPSULE | Freq: Four times a day (QID) | ORAL | Status: DC | PRN

## 2014-12-07 NOTE — Telephone Encounter (Signed)
Cough and congestion- clear for 2 days. No fever, no wheezing. Want something for cough.

## 2014-12-07 NOTE — Telephone Encounter (Signed)
Pt states she has the same cough an congestion/crud that  She had 9/27, wants to know if you can call in another round Of antibiotics   [facility]

## 2014-12-07 NOTE — Telephone Encounter (Signed)
Tess perle 100 one q6hrs prn cough

## 2014-12-07 NOTE — Telephone Encounter (Signed)
Med sent to pharm. Pt notified.  

## 2014-12-11 ENCOUNTER — Ambulatory Visit (INDEPENDENT_AMBULATORY_CARE_PROVIDER_SITE_OTHER): Payer: 59 | Admitting: Family Medicine

## 2014-12-11 VITALS — BP 138/88 | Temp 98.0°F | Ht 66.0 in | Wt 339.0 lb

## 2014-12-11 DIAGNOSIS — J208 Acute bronchitis due to other specified organisms: Secondary | ICD-10-CM | POA: Diagnosis not present

## 2014-12-11 DIAGNOSIS — J3089 Other allergic rhinitis: Secondary | ICD-10-CM | POA: Diagnosis not present

## 2014-12-11 MED ORDER — LIRAGLUTIDE 18 MG/3ML ~~LOC~~ SOPN: PEN_INJECTOR | SUBCUTANEOUS | Status: DC

## 2014-12-11 MED ORDER — PREDNISONE 20 MG PO TABS: ORAL_TABLET | ORAL | Status: DC

## 2014-12-11 MED ORDER — HYDROCODONE-HOMATROPINE 5-1.5 MG/5ML PO SYRP: 5.0000 mL | ORAL_SOLUTION | Freq: Four times a day (QID) | ORAL | Status: DC | PRN

## 2014-12-11 MED ORDER — ALBUTEROL SULFATE HFA 108 (90 BASE) MCG/ACT IN AERS: 2.0000 | INHALATION_SPRAY | Freq: Four times a day (QID) | RESPIRATORY_TRACT | Status: DC | PRN

## 2014-12-11 NOTE — Progress Notes (Signed)
   Subjective:    Patient ID: Amy Hunter, female    DOB: [birth year], 54 y.o.   MRN: [id number]  Cough This is a new problem. Episode onset: 6 days ago. Cough characteristics: productive of clear sputum. Associated symptoms include rhinorrhea. Pertinent negatives include no chest pain, ear pain, fever, shortness of breath or wheezing. Treatments tried: otc cough syrup, tessalon  The treatment provided no relief.   Patient is losing weight well with taking medication.   Review of Systems  Constitutional: Negative for fever and activity change.  HENT: Positive for congestion and rhinorrhea. Negative for ear pain.   Eyes: Negative for discharge.  Respiratory: Positive for cough. Negative for shortness of breath and wheezing.   Cardiovascular: Negative for chest pain.       Objective:   Physical Exam  Constitutional: She appears well-developed.  HENT:  Head: Normocephalic.  Nose: Nose normal.  Mouth/Throat: Oropharynx is clear and moist. No oropharyngeal exudate.  Neck: Neck supple.  Cardiovascular: Normal rate and normal heart sounds.   No murmur heard. Pulmonary/Chest: Effort normal and breath sounds normal. She has no wheezes.  Lymphadenopathy:    She has no cervical adenopathy.  Skin: Skin is warm and dry.  Nursing note and vitals reviewed.         Assessment & Plan:  I would recommend a short course of prednisone as well as albuterol this will help cover for the possibility of allergies as well as viral bronchitis no antibiotics indicated currently, prescription cough medication caution drowsiness  Patient losing weight well with medication sugars under fairly good control but readings are still above desired goal. Ranging 120-160. Increase Victoza to 1.8mg  daily. Send us readings again in a few weeks time follow-up approximately 3 months

## 2014-12-22 ENCOUNTER — Other Ambulatory Visit: Payer: Self-pay | Admitting: Family Medicine

## 2015-01-18 ENCOUNTER — Other Ambulatory Visit: Payer: Self-pay | Admitting: Family Medicine

## 2015-02-06 MED FILL — METOPROLOL SUCC ER 50 MG TA: 50 | 30 days supply | Qty: 30 | Fill #2

## 2015-02-06 MED FILL — NAPROXEN 500 MG TABLET: 500 | 30 days supply | Qty: 60 | Fill #7

## 2015-02-13 MED FILL — UNIFINE PENTIPS 8MM 31G: 31G X 8 MM | 90 days supply | Qty: 100 | Fill #1

## 2015-02-14 MED FILL — LEVOTHYROXINE 200 MCG TAB: 200 | 30 days supply | Qty: 36 | Fill #3

## 2015-02-27 MED FILL — PRAVASTATIN SODIUM 20 MG TA: 20 | 30 days supply | Qty: 30 | Fill #3

## 2015-02-27 MED FILL — LISINOPRIL 2.5 MG TABLET: 2.5 | 30 days supply | Qty: 30 | Fill #3

## 2015-02-27 MED FILL — TORSEMIDE 20 MG TABLET: 20 | 30 days supply | Qty: 180 | Fill #3

## 2015-02-27 MED FILL — POTASSIUM CL ER 20 MEQ TAB: 20 | 30 days supply | Qty: 60 | Fill #2

## 2015-03-09 MED FILL — METOPROLOL SUCC ER 50 MG TA: 50 | 30 days supply | Qty: 30 | Fill #3

## 2015-03-16 MED FILL — NAPROXEN 500 MG TABLET: 500 | 30 days supply | Qty: 60 | Fill #8

## 2015-03-21 ENCOUNTER — Telehealth: Payer: Self-pay | Admitting: Family Medicine

## 2015-03-21 MED ORDER — ONDANSETRON 8 MG PO TBDP: 8.0000 mg | ORAL_TABLET | Freq: Three times a day (TID) | ORAL | Status: DC | PRN

## 2015-03-21 NOTE — Telephone Encounter (Signed)
Patient started with vomiting last night and the vomiting has slowed down this and but having diarrhea. Patient reports no fever but is having nausea and not feeling well. Patient states she is doing frequent small sips of fluids and is able to keep that down this am for the most part. Patient would like zofran sent into the pharmacy. Rx sent electronically to pharmacy.

## 2015-03-21 NOTE — Telephone Encounter (Signed)
Nurse's-please discuss with patient regarding symptomatology. Encourage clear liquids small amounts frequently if not improving over the next 24-48 hours will need office visit. Patient has choices. Zofran 8 mg ODT, 1 3 times a day when necessary nausea-dissolves in the mouth-#12 with one refill.(Second choices if this is too expensive Phenergan tablets 25 mg one twice a day when necessary nausea-#14 can cause drowsiness) see which one she would prefer and prescribe accordingly follow-up if ongoing troubles

## 2015-03-21 NOTE — Telephone Encounter (Signed)
Patient is having vomiting and diarrhea that started last night.  Can we call in something for the nausea?   [facility] [place]

## 2015-03-26 MED FILL — LISINOPRIL 2.5 MG TABLET: 2.5 | 30 days supply | Qty: 30 | Fill #4

## 2015-03-26 MED FILL — TORSEMIDE 20 MG TABLET: 20 | 30 days supply | Qty: 180 | Fill #4

## 2015-03-26 MED FILL — LEVOTHYROXINE 200 MCG TAB: 200 | 30 days supply | Qty: 36 | Fill #4

## 2015-03-26 MED FILL — POTASSIUM CL ER 20 MEQ TAB: 20 | 30 days supply | Qty: 60 | Fill #3

## 2015-04-02 MED FILL — PRAVASTATIN SODIUM 20 MG TA: 20 | 30 days supply | Qty: 30 | Fill #4

## 2015-04-12 MED FILL — VICTOZA 18 MG/3 ML INJECT P: 18 | 90 days supply | Qty: 27 | Fill #1

## 2015-04-17 MED FILL — METOPROLOL SUCC ER 50 MG TA: 50 | 30 days supply | Qty: 30 | Fill #4

## 2015-04-24 MED FILL — LISINOPRIL 2.5 MG TABLET: 2.5 | 30 days supply | Qty: 30 | Fill #5

## 2015-04-24 MED FILL — NAPROXEN 500 MG TABLET: 500 | 30 days supply | Qty: 60 | Fill #9

## 2015-04-24 MED FILL — POTASSIUM CL ER 20 MEQ TAB: 20 | 30 days supply | Qty: 60 | Fill #4

## 2015-04-24 MED FILL — LEVOTHYROXINE 200 MCG TAB: 200 | 30 days supply | Qty: 36 | Fill #5

## 2015-04-25 ENCOUNTER — Other Ambulatory Visit: Payer: Self-pay | Admitting: *Deleted

## 2015-04-25 MED ORDER — TORSEMIDE 20 MG PO TABS: ORAL_TABLET | ORAL | Status: DC

## 2015-04-25 MED FILL — TORSEMIDE 20 MG TABLET: 20 | 30 days supply | Qty: 180 | Fill #0

## 2015-05-01 MED FILL — PRAVASTATIN SODIUM 20 MG TA: 20 | 30 days supply | Qty: 30 | Fill #5

## 2015-05-22 ENCOUNTER — Other Ambulatory Visit: Payer: Self-pay | Admitting: Family Medicine

## 2015-05-22 MED FILL — UNIFINE PENTIPS 8MM 31G: 31G X 8 MM | 90 days supply | Qty: 100 | Fill #0

## 2015-05-23 ENCOUNTER — Other Ambulatory Visit: Payer: Self-pay | Admitting: Family Medicine

## 2015-05-23 MED FILL — LISINOPRIL 2.5 MG TABLET: 2.5 | 30 days supply | Qty: 30 | Fill #0

## 2015-05-23 MED FILL — KLOR-CON M20 TABLET: 20 | 30 days supply | Qty: 60 | Fill #5

## 2015-05-23 MED FILL — METOPROLOL SUCC ER 50 MG TA: 50 | 30 days supply | Qty: 30 | Fill #5

## 2015-05-23 MED FILL — TORSEMIDE 20 MG TABLET: 20 | 30 days supply | Qty: 180 | Fill #1

## 2015-05-23 MED FILL — NAPROXEN 500 MG TABLET: 500 | 30 days supply | Qty: 60 | Fill #10

## 2015-05-30 ENCOUNTER — Other Ambulatory Visit: Payer: Self-pay | Admitting: Family Medicine

## 2015-05-30 MED FILL — PRAVASTATIN SODIUM 20 MG TA: 20 | 30 days supply | Qty: 30 | Fill #0

## 2015-05-30 MED FILL — LEVOTHYROXINE 200 MCG TAB: 200 | 30 days supply | Qty: 36 | Fill #0

## 2015-06-21 ENCOUNTER — Other Ambulatory Visit: Payer: Self-pay

## 2015-06-21 MED ORDER — METOPROLOL SUCCINATE ER 50 MG PO TB24: ORAL_TABLET | ORAL | Status: DC

## 2015-06-21 MED ORDER — PRAVASTATIN SODIUM 20 MG PO TABS
20.0000 mg | ORAL_TABLET | Freq: Every evening | ORAL | Status: DC
Start: 2015-06-21 — End: 2015-08-06

## 2015-06-21 MED ORDER — LISINOPRIL 2.5 MG PO TABS: 2.5000 mg | ORAL_TABLET | Freq: Every morning | ORAL | Status: DC

## 2015-06-21 MED ORDER — LEVOTHYROXINE SODIUM 200 MCG PO TABS: ORAL_TABLET | ORAL | Status: DC

## 2015-06-21 MED ORDER — POTASSIUM CHLORIDE CRYS ER 20 MEQ PO TBCR: 20.0000 meq | EXTENDED_RELEASE_TABLET | Freq: Two times a day (BID) | ORAL | Status: DC

## 2015-06-21 MED ORDER — TORSEMIDE 20 MG PO TABS: ORAL_TABLET | ORAL | Status: DC

## 2015-06-21 MED FILL — METOPROLOL SUCC ER 50 MG TA: 50 | 30 days supply | Qty: 30 | Fill #0

## 2015-06-21 MED FILL — KLOR-CON M20 TABLET: 20 | 30 days supply | Qty: 60 | Fill #0

## 2015-06-21 MED FILL — PRAVASTATIN SODIUM 20 MG TA: 20 | 30 days supply | Qty: 30 | Fill #0

## 2015-06-21 MED FILL — LISINOPRIL 2.5 MG TABLET: 2.5 | 30 days supply | Qty: 30 | Fill #0

## 2015-06-21 MED FILL — NAPROXEN 500 MG TABLET: 500 | 30 days supply | Qty: 60 | Fill #11

## 2015-06-21 MED FILL — TORSEMIDE 20 MG TABLET: 20 | 30 days supply | Qty: 180 | Fill #0

## 2015-06-21 MED FILL — LEVOTHYROXINE 200 MCG TAB: 200 | 30 days supply | Qty: 36 | Fill #0

## 2015-06-22 ENCOUNTER — Ambulatory Visit (INDEPENDENT_AMBULATORY_CARE_PROVIDER_SITE_OTHER): Payer: 59 | Admitting: Family Medicine

## 2015-06-22 ENCOUNTER — Encounter: Payer: Self-pay | Admitting: Family Medicine

## 2015-06-22 VITALS — BP 122/74 | Ht 66.0 in

## 2015-06-22 DIAGNOSIS — M79671 Pain in right foot: Secondary | ICD-10-CM

## 2015-06-22 MED ORDER — HYDROCODONE-ACETAMINOPHEN 7.5-325 MG PO TABS: 1.0000 | ORAL_TABLET | Freq: Four times a day (QID) | ORAL | Status: DC | PRN

## 2015-06-22 MED ORDER — INDOMETHACIN 50 MG PO CAPS: 50.0000 mg | ORAL_CAPSULE | Freq: Three times a day (TID) | ORAL | Status: DC

## 2015-06-22 NOTE — Progress Notes (Signed)
   Subjective:    Patient ID: Dominga FerrySandra W Maille, female    DOB: 11-Aug-1960, 55 y.o.   MRN: [id number]  HPIRight foot pain and swelling. Started 3 days ago. Taking tylenol and soaking.   Patient states she had pain discomfort swelling a few days ago no redness warmth no sign of infection she states is actually doing somewhat better now she is interested in getting some further evaluation but does not want to go through any testing currently in eyes fever chills nausea vomiting diarrhea denies injury  Review of Systems     Objective:   Physical Exam  The calf is normal the ankle is normal there is some mild-to-moderate tenderness in the midfoot.      Assessment & Plan:  Significant foot pain and discomfort. Will try anti-inflammatory. If patient does not improve by early next week lab work to look at possibility of gout along with x-ray indicated patient agrees to this approach

## 2015-06-23 ENCOUNTER — Emergency Department (HOSPITAL_COMMUNITY): Payer: 59

## 2015-06-23 ENCOUNTER — Encounter (HOSPITAL_COMMUNITY): Payer: Self-pay | Admitting: Emergency Medicine

## 2015-06-23 ENCOUNTER — Emergency Department (HOSPITAL_COMMUNITY)
Admission: EM | Admit: 2015-06-23 | Discharge: 2015-06-23 | Disposition: A | Discharge status: Home / Self Care | Payer: 59 | Attending: Emergency Medicine | Admitting: Emergency Medicine

## 2015-06-23 DIAGNOSIS — R110 Nausea: Secondary | ICD-10-CM | POA: Diagnosis not present

## 2015-06-23 DIAGNOSIS — I10 Essential (primary) hypertension: Secondary | ICD-10-CM | POA: Diagnosis not present

## 2015-06-23 DIAGNOSIS — Z79899 Other long term (current) drug therapy: Secondary | ICD-10-CM | POA: Diagnosis not present

## 2015-06-23 DIAGNOSIS — R42 Dizziness and giddiness: Secondary | ICD-10-CM | POA: Insufficient documentation

## 2015-06-23 DIAGNOSIS — T39391A Poisoning by other nonsteroidal anti-inflammatory drugs [NSAID], accidental (unintentional), initial encounter: Secondary | ICD-10-CM | POA: Diagnosis not present

## 2015-06-23 DIAGNOSIS — E119 Type 2 diabetes mellitus without complications: Secondary | ICD-10-CM | POA: Diagnosis not present

## 2015-06-23 DIAGNOSIS — T50905A Adverse effect of unspecified drugs, medicaments and biological substances, initial encounter: Secondary | ICD-10-CM

## 2015-06-23 DIAGNOSIS — Z87891 Personal history of nicotine dependence: Secondary | ICD-10-CM | POA: Diagnosis not present

## 2015-06-23 DIAGNOSIS — T7840XA Allergy, unspecified, initial encounter: Secondary | ICD-10-CM | POA: Diagnosis not present

## 2015-06-23 DIAGNOSIS — Z7984 Long term (current) use of oral hypoglycemic drugs: Secondary | ICD-10-CM | POA: Diagnosis not present

## 2015-06-23 LAB — BASIC METABOLIC PANEL
Anion gap: 8 (ref 5–15)
BUN: 15 mg/dL (ref 6–20)
CALCIUM: 8.4 mg/dL — AB (ref 8.9–10.3)
CO2: 28 mmol/L (ref 22–32)
Chloride: 103 mmol/L (ref 101–111)
Creatinine, Ser: 0.8 mg/dL (ref 0.44–1.00)
GFR calc Af Amer: >60 mL/min (ref >60)
GLUCOSE: 151 mg/dL — AB (ref 65–99)
Potassium: 3.4 mmol/L — ABNORMAL LOW (ref 3.5–5.1)
SODIUM: 139 mmol/L (ref 135–145)

## 2015-06-23 LAB — CBC WITH DIFFERENTIAL/PLATELET
BASOS ABS: 0 10*3/uL (ref 0.0–0.1)
BASOS PCT: 0 %
EOS ABS: 0.2 10*3/uL (ref 0.0–0.7)
EOS PCT: 3 %
HCT: 38.3 % (ref 36.0–46.0)
Hemoglobin: 12.4 g/dL (ref 12.0–15.0)
Lymphocytes Relative: 21 %
Lymphs Abs: 1.9 10*3/uL (ref 0.7–4.0)
MCH: 26.7 pg (ref 26.0–34.0)
MCHC: 32.4 g/dL (ref 30.0–36.0)
MCV: 82.5 fL (ref 78.0–100.0)
MONO ABS: 0.8 10*3/uL (ref 0.1–1.0)
Monocytes Relative: 9 %
Neutro Abs: 6 10*3/uL (ref 1.7–7.7)
Neutrophils Relative %: 67 %
PLATELETS: 185 10*3/uL (ref 150–400)
RBC: 4.64 MIL/uL (ref 3.87–5.11)
RDW: 15.4 % (ref 11.5–15.5)
WBC: 8.9 10*3/uL (ref 4.0–10.5)

## 2015-06-23 LAB — TROPONIN I: Troponin I: <0.03 ng/mL

## 2015-06-23 MED ORDER — PROMETHAZINE HCL 25 MG PO TABS: 25.0000 mg | ORAL_TABLET | Freq: Four times a day (QID) | ORAL | Status: DC | PRN

## 2015-06-23 MED ORDER — ONDANSETRON HCL 4 MG/2ML IJ SOLN
4.0000 mg | Freq: Once | INTRAMUSCULAR | Status: AC
  Administered 2015-06-23: 4 mg via INTRAVENOUS
  Filled 2015-06-23: qty 2

## 2015-06-23 MED ORDER — PROCHLORPERAZINE EDISYLATE 5 MG/ML IJ SOLN
10.0000 mg | Freq: Once | INTRAMUSCULAR | Status: DC
  Filled 2015-06-23: qty 2

## 2015-06-23 NOTE — ED Provider Notes (Signed)
CSN: [id number]     Arrival date & time 06/23/15  0915 History  By signing my name below, I, Amy Hunter, attest that this documentation has been prepared under the direction and in the presence of Amy CoreNathan Parisha Beaulac, MD.  Electronically Signed: Andrew Auaven Hunter, ED Scribe. 06/23/2015. 2:15 PM.    Chief Complaint  Patient presents with  . Allergic Reaction    The history is provided by the patient. No language interpreter was used.   HPI Comments:  Amy Hunter is a 55 y.o. female who present to the Emergency Department complaining of a possible allergic reactions. Pt is currently being treated for foot infection. She is on an Indomethacin 500mg  and took the first dose last night. Pt woke up this morning with room spinning dizziness along with nausea, mouth dryness making is difficult to swallow and photophobia. She denies CP, SOB, emesis and diarrhea.  Past Medical History  Diagnosis Date  . Diabetes mellitus   . Hypertension   . Allergy   . Sleep apnea    Past Surgical History  Procedure Laterality Date  . Cholecystectomy    . Varicose vein surgery Bilateral     laser treatment--ligation   Family History  Problem Relation Age of Onset  . Diabetes Mother   . Hypertension Mother   . Heart disease Mother   . Heart disease Brother   . Heart disease Father   . Breast cancer Mother   . Lung cancer Mother   . Lung cancer Father   . Brain cancer Father   . Lung cancer Maternal Grandmother   . Colon cancer Paternal Grandmother   . Asthma Father    Social History  Substance Use Topics  . Smoking status: Former Smoker -- 0.50 packs/day for 15 years    Types: Cigarettes    Quit date: 02/03/2010  . Smokeless tobacco: Never Used     Comment: pt reports she smoked "off and on" for the entire smoking years.   . Alcohol Use: No   OB History    Gravida Para Term Preterm AB TAB SAB Ectopic Multiple Living   1 1 1       1      Review of Systems  Eyes: Positive for photophobia.   Respiratory: Negative for chest tightness and shortness of breath.   Cardiovascular: Negative for chest pain.  Gastrointestinal: Positive for nausea. Negative for vomiting and diarrhea.  Neurological: Positive for dizziness.   Allergies  Benadryl  Home Medications   Prior to Admission medications   Medication Sig Start Date End Date Taking? Authorizing Provider  albuterol (PROVENTIL HFA;VENTOLIN HFA) 108 (90 BASE) MCG/ACT inhaler Inhale 2 puffs into the lungs every 6 (six) hours as needed for wheezing. 12/11/14  Yes Babs SciaraScott A Luking, MD  ALPRAZolam Prudy Feeler(XANAX) 1 MG tablet Take 1 mg by mouth 3 (three) times daily as needed for anxiety or sleep.    Yes Historical Provider, MD  Cyanocobalamin (VITAMIN B-12) 2000 MCG TBCR Take 1 tablet by mouth daily.   Yes Historical Provider, MD  fluticasone (FLONASE) 50 MCG/ACT nasal spray Place 2 sprays into the nose daily as needed for allergies.    Yes Historical Provider, MD  HYDROcodone-acetaminophen (NORCO) 7.5-325 MG tablet Take 1 tablet by mouth every 6 (six) hours as needed. Patient taking differently: Take 1 tablet by mouth every 6 (six) hours as needed for moderate pain.  06/22/15  Yes Babs SciaraScott A Luking, MD  indomethacin (INDOCIN) 50 MG capsule Take 1 capsule (50 mg  total) by mouth 3 (three) times daily with meals. 06/22/15  Yes Babs Sciara, MD  ketoconazole (NIZORAL) 2 % cream Apply 1 application topically 2 (two) times daily. 10/06/14  Yes Merlyn Albert, MD  levothyroxine (SYNTHROID, LEVOTHROID) 200 MCG tablet TAKE 1 TABLET BY MOUTH DAILY AND 1 & 1/2 TABLETS BY MOUTH EVERY MONDAY 06/21/15  Yes Babs Sciara, MD  Liraglutide (VICTOZA) 18 MG/3ML SOPN 1.8 subcutaneous daily 12/11/14  Yes Babs Sciara, MD  lisinopril (PRINIVIL,ZESTRIL) 2.5 MG tablet Take 1 tablet (2.5 mg total) by mouth every morning. 06/21/15  Yes Babs Sciara, MD  metoprolol succinate (TOPROL-XL) 50 MG 24 hr tablet TAKE 1 TABLET BY MOUTH DAILY WITH OR IMMEDIATELY FOLLOWING A MEAL.  06/21/15  Yes Scott A Luking, MD  mometasone (ELOCON) 0.1 % cream APPLY TOPICALLY 2 TIMES DAILY AS NEEDED Patient taking differently: APPLY TOPICALLY 2 TIMES DAILY AS NEEDED FOR RASH. 01/18/15  Yes Merlyn Albert, MD  ondansetron (ZOFRAN ODT) 8 MG disintegrating tablet Take 1 tablet (8 mg total) by mouth every 8 (eight) hours as needed for nausea or vomiting. 03/21/15  Yes Babs Sciara, MD  potassium chloride SA (K-DUR,KLOR-CON) 20 MEQ tablet Take 1 tablet (20 mEq total) by mouth 2 (two) times daily. 06/21/15  Yes Babs Sciara, MD  pravastatin (PRAVACHOL) 20 MG tablet Take 1 tablet (20 mg total) by mouth every evening. 06/21/15  Yes Babs Sciara, MD  torsemide (DEMADEX) 20 MG tablet TAKE 3 TABLETS BY MOUTH EVERY MORNING AND 3 TABLETS AT NOON 06/21/15  Yes Babs Sciara, MD  promethazine (PHENERGAN) 25 MG tablet Take 1 tablet (25 mg total) by mouth every 6 (six) hours as needed for nausea. 06/23/15   Amy Core, MD  UNIFINE PENTIPS 31G X 8 MM MISC USE AS DIRECTED DAILY WITH VICTOZA 05/22/15   Babs Sciara, MD   BP 101/56 mmHg  Pulse 89  Temp(Src) 97.9 F (36.6 C) (Oral)  Resp 17  Ht 5\' 6"  (1.676 m)  Wt 339 lb (153.769 kg)  BMI 54.74 kg/m2  SpO2 95% Physical Exam  Constitutional: She is oriented to person, place, and time. She appears well-developed and well-nourished. No distress.  Obese  HENT:  Head: Normocephalic and atraumatic.  Eyes: Conjunctivae and EOM are normal. Pupils are equal, round, and reactive to light.  Normal eye movement  Neck: Neck supple.  Cardiovascular: Normal rate.   Pulmonary/Chest: Effort normal.  Musculoskeletal: Normal range of motion.  Lower extremities with non pitted edema   Neurological: She is alert and oriented to person, place, and time.  Finger to nose intact. Face is symmetric.   Skin: Skin is warm and dry.  Psychiatric: She has a normal mood and affect. Her behavior is normal.  Nursing note and vitals reviewed.   ED Course   Procedures (including critical care time) DIAGNOSTIC STUDIES: Oxygen Saturation is 98% on RA, normal by my interpretation.    COORDINATION OF CARE: 2:15 PM- Pt advised of plan for treatment and pt agrees.  Labs Review Labs Reviewed  BASIC METABOLIC PANEL - Abnormal; Notable for the following:    Potassium 3.4 (*)    Glucose, Bld 151 (*)    Calcium 8.4 (*)    All other components within normal limits  CBC WITH DIFFERENTIAL/PLATELET  TROPONIN I    Imaging Review Dg Chest 2 View  06/23/2015  CLINICAL DATA:  Dizziness EXAM: CHEST  2 VIEW COMPARISON:  05/03/2012 FINDINGS: Lungs are clear.  No pleural effusion or pneumothorax. The heart is top-normal in size. Mild degenerative changes of the visualized thoracolumbar spine. IMPRESSION: No evidence of acute cardiopulmonary disease. Electronically Signed   By: Charline Bills M.D.   On: 06/23/2015 10:58   Ct Head Wo Contrast  06/23/2015  CLINICAL DATA:  Dizziness with nausea.  Photophobia. EXAM: CT HEAD WITHOUT CONTRAST TECHNIQUE: Contiguous axial images were obtained from the base of the skull through the vertex without intravenous contrast. COMPARISON:  None. FINDINGS: Paranasal sinuses, mastoid air cells, bones, and extracranial soft tissues are within normal limits. No subdural, epidural, or subarachnoid hemorrhage. The cerebellum, brainstem, and basal cisterns are normal. The ventricles are unremarkable other than the temporal horn on the right which is mildly prominent but of doubtful acute significance. No mass, mass effect, or midline shift. No acute cortical ischemia or infarct. IMPRESSION: No acute abnormalities. Electronically Signed   By: Gerome Sam III M.D   On: 06/23/2015 12:28   I have personally reviewed and evaluated these images and lab results as part of my medical decision-making.   EKG Interpretation   Date/Time:  Saturday Jun 23 2015 09:28:47 EDT Ventricular Rate:  99 PR Interval:  171 QRS Duration: 102 QT  Interval:  373 QTC Calculation: 479 R Axis:   68 Text Interpretation:  Sinus rhythm Low voltage, precordial leads Confirmed  by Rubin Payor  MD, Harrold Donath [phone]) on 06/23/2015 9:38:55 AM      MDM   Final diagnoses:  Medication reaction, initial encounter    Patient with likely reaction to medication. Recently started indomethacin. Now has chest pain and feeling dizzy. EKG and lab work reassuring. Patient's dizziness improved and she is some mostly nausea. That improved with the second dose of Zofran. Head CT done and was reassuring. Central vertigo felt less likely since his first time should the medicine may be the cause of it. Will discharge home. Follow-ups needed.    Amy Core, MD 06/23/15 1415

## 2015-06-23 NOTE — ED Notes (Signed)
Patient c/o possible allergic reaction to antibiotic. Per patient started Indomethacin 500mg  yesterday for infection in foot (first diose last night and second dose this morning). Patient reports sudden onset of dizziness, nausea, and chest tightness. Denies any vomiting. Denies any swelling in tongue , able to handle oral secretions. Per patient difficulty swallowing due to mouth being dry.

## 2015-06-23 NOTE — ED Notes (Signed)
Pt ambulated to bathroom with steady gait but pt states nausea is worse

## 2015-06-23 NOTE — ED Notes (Signed)
EDP notified of pt still with c/o nausea

## 2015-06-23 NOTE — Discharge Instructions (Signed)
Drug Toxicity °Drug toxicity refers to harmful and unwanted (adverse) effects of a drug in your body. Drug toxicity often results from taking too much of a drug (overdose) by accident or on purpose. With some drugs, there is only a small difference between the dose that is needed to treat your condition and a dose that is harmful (narrow therapeutic range). However, any drug can be toxic at high doses, and even normal doses of certain drugs can be toxic for some people. These include over-the-counter (OTC) medicines. °Drug toxicity can happen suddenly when you first start taking a drug or when you suddenly take too much of a drug (acute toxicity). It can also happen as a result of taking a drug for a long period of time (chronic toxicity). The effects of drug toxicity can be mild, dangerous, or even deadly. °CAUSES °Many things can cause drug toxicity. Common causes of acute toxicity include a drug overdose or an allergic reaction to a drug. °Most drugs are broken down (metabolized) by your liver and eliminated (excreted) by your kidneys. Chronic drug toxicity can result from changes in the way that your body metabolizes a drug. This can happen, for example, if you weigh less than you did when you started taking a drug but you keep taking the same dose that you took at the heavier weight. °RISK FACTORS °You may have a higher risk for drug toxicity if you: °· Are under 18 years of age or over 65 years of age. °· Have liver disease, kidney disease, or another medical condition. °· Are taking more than one drug. °· Are pregnant. °· Are allergic to certain drugs. °· Have genes that cause you to be more affected by (susceptible to) certain drugs. °· Take a drug that has a narrow therapeutic range. °Certain types of drugs are more likely than others to cause toxicity. Many drugs have a narrow therapeutic range, including: °· Blood thinners. °· Heart medicines. °· Diabetes medicines. °· Medicines to prevent or stop  seizures. °· Theophylline for asthma. °· Lithium for bipolar disorder. °SYMPTOMS °Signs and symptoms of drug toxicity depend on the drug and the amount that was taken. They may start suddenly or develop gradually over time. °DIAGNOSIS °Drug toxicity may be diagnosed based on your symptoms. Some drugs have known side effects that suggest toxicity. It is important that you tell health care provider about all of the drugs that you are taking and whether you have ever had a reaction to a drug. °Your health care provider will do a physical exam. You may have tests to check for drug toxicity, including: °· Blood tests to measure the amount of the drug in your blood or to check for signs of kidney or liver damage. °· Urine tests. °· Other tests to check for organ damage. °TREATMENT °Treatment may include: °· Stopping the drug. °· Lowering the dose of the drug. °· Switching to a different drug. °You may also need treatment to stop or reverse the effects of the toxicity. These treatments depend on the drug that caused the toxicity, how severe the toxicity is, and which parts of your body are affected. °HOME CARE INSTRUCTIONS °· Take medicines only as directed by your health care provider. Always ask your health care provider to discuss the possible side effects of any new drug that you start taking. °· Keep a list of all of the drugs that you take, including over-the-counter medicines. Bring this list with you to all of your medical visits. °· Read   the drug inserts that come with your medicines. °· Keep all follow-up visits as directed by your health care provider. This is important. °SEEK MEDICAL CARE IF: °· Your symptoms return. °· You develop any new signs or symptoms when you are taking medicines. °· You notice any signs that indicate that you are taking too much of your medicine, based on what your health care provider told you to watch for. °SEEK IMMEDIATE MEDICAL CARE IF: °· You have chest pain. °· You have difficulty  breathing. °· You have a loss of consciousness. °  °This information is not intended to replace advice given to you by your health care provider. Make sure you discuss any questions you have with your health care provider. °  °Document Released: 01/20/2005 Document Revised: 06/06/2014 Document Reviewed: 01/25/2014 °Elsevier Interactive Patient Education ©2016 [employer] ° °

## 2015-06-25 ENCOUNTER — Ambulatory Visit (HOSPITAL_COMMUNITY)
Admission: RE | Admit: 2015-06-25 | Discharge: 2015-06-25 | Disposition: A | Discharge status: Home / Self Care | Payer: 59 | Source: Ambulatory Visit | Attending: Family Medicine | Admitting: Family Medicine

## 2015-06-25 ENCOUNTER — Telehealth: Payer: Self-pay | Admitting: Family Medicine

## 2015-06-25 DIAGNOSIS — M7731 Calcaneal spur, right foot: Secondary | ICD-10-CM | POA: Diagnosis not present

## 2015-06-25 DIAGNOSIS — M79671 Pain in right foot: Secondary | ICD-10-CM | POA: Diagnosis not present

## 2015-06-25 NOTE — Telephone Encounter (Signed)
Notified patient at this point Dr. Lorin PicketScott recommends that x-ray of the right foot also recommend CBC along with uric acid level sedimentation rate and C-reactive protein. In addition to this we can use pain medication if the patient needs a prescription for pain medicine- do not recommend anti-inflammatories currently. Patient verbalized understanding. Xray in system. Blood work ordered.

## 2015-06-25 NOTE — Telephone Encounter (Signed)
Pt called stating that her foot is not any better and that she had a reaction to the anti inflammatory and ended up in the er. Please advise.

## 2015-06-25 NOTE — Telephone Encounter (Signed)
Patient states that she was seen on 06/22/15 for right foot pain and was given indomethacin. Patient had an allergic reaction to this med and was [facility]. Patient has stopped medication as advised but she is still having right foot pain to the point she is unable to walk on it. Please advise.

## 2015-06-25 NOTE — Telephone Encounter (Signed)
At this point I recommend that x-ray of the right foot I also recommend CBC along with uric acid level sedimentation rate and C-reactive protein. In addition to this we can use pain medication if the patient needs a prescription for pain medicine-I do not recommend anti-inflammatories currently

## 2015-06-26 ENCOUNTER — Other Ambulatory Visit: Payer: Self-pay

## 2015-06-26 LAB — CBC WITH DIFFERENTIAL/PLATELET
BASOS ABS: 0 10*3/uL (ref 0.0–0.2)
BASOS: 0 %
EOS (ABSOLUTE): 0.3 10*3/uL (ref 0.0–0.4)
Eos: 3 %
Hematocrit: 39.7 % (ref 34.0–46.6)
Hemoglobin: 13.7 g/dL (ref 11.1–15.9)
Immature Grans (Abs): 0 10*3/uL (ref 0.0–0.1)
Immature Granulocytes: 0 %
LYMPHS ABS: 2.7 10*3/uL (ref 0.7–3.1)
Lymphs: 24 %
MCH: 26.9 pg (ref 26.6–33.0)
MCHC: 34.5 g/dL (ref 31.5–35.7)
MCV: 78 fL — AB (ref 79–97)
Monocytes Absolute: 1.1 10*3/uL — ABNORMAL HIGH (ref 0.1–0.9)
Monocytes: 9 %
NEUTROS ABS: 7.1 10*3/uL — AB (ref 1.4–7.0)
Neutrophils: 64 %
PLATELETS: 255 10*3/uL (ref 150–379)
RBC: 5.1 x10E6/uL (ref 3.77–5.28)
RDW: 15.7 % — ABNORMAL HIGH (ref 12.3–15.4)
WBC: 11.3 10*3/uL — ABNORMAL HIGH (ref 3.4–10.8)

## 2015-06-26 LAB — SEDIMENTATION RATE: Sed Rate: 27 mm/hr (ref 0–40)

## 2015-06-26 LAB — URIC ACID: URIC ACID: 9.9 mg/dL — AB (ref 2.5–7.1)

## 2015-06-26 LAB — C-REACTIVE PROTEIN: CRP: 7.8 mg/L — ABNORMAL HIGH (ref 0.0–4.9)

## 2015-06-26 MED ORDER — PREDNISONE 20 MG PO TABS: ORAL_TABLET | ORAL | Status: DC

## 2015-06-26 MED ORDER — COLCHICINE 0.6 MG PO TABS: ORAL_TABLET | ORAL | Status: DC

## 2015-07-06 MED FILL — VICTOZA 18 MG/3 ML INJECT P: 18 | 90 days supply | Qty: 27 | Fill #2

## 2015-07-23 MED FILL — KLOR-CON M20 TABLET: 20 | 30 days supply | Qty: 60 | Fill #1

## 2015-07-23 MED FILL — METOPROLOL SUCC ER 50 MG TA: 50 | 30 days supply | Qty: 30 | Fill #1

## 2015-07-30 ENCOUNTER — Other Ambulatory Visit: Payer: Self-pay | Admitting: *Deleted

## 2015-07-30 MED ORDER — LISINOPRIL 2.5 MG PO TABS: 2.5000 mg | ORAL_TABLET | Freq: Every morning | ORAL | Status: DC

## 2015-07-30 MED ORDER — NAPROXEN 500 MG PO TABS: ORAL_TABLET | ORAL | Status: DC

## 2015-07-30 MED FILL — NAPROXEN 500 MG TABLET: 500 | 30 days supply | Qty: 60 | Fill #0

## 2015-07-30 MED FILL — LISINOPRIL 2.5 MG TABLET: 2.5 | 30 days supply | Qty: 30 | Fill #0

## 2015-07-30 MED FILL — TORSEMIDE 20 MG TABLET: 20 | 30 days supply | Qty: 180 | Fill #1

## 2015-07-31 ENCOUNTER — Telehealth: Payer: Self-pay | Admitting: Family Medicine

## 2015-07-31 NOTE — Telephone Encounter (Signed)
ERROR

## 2015-08-06 ENCOUNTER — Other Ambulatory Visit: Payer: Self-pay | Admitting: Family Medicine

## 2015-08-06 MED FILL — PRAVASTATIN SODIUM 20 MG TA: 20 | 30 days supply | Qty: 30 | Fill #0

## 2015-08-06 MED FILL — LEVOTHYROXINE 200 MCG TAB: 200 | 30 days supply | Qty: 36 | Fill #0

## 2015-08-17 ENCOUNTER — Ambulatory Visit (INDEPENDENT_AMBULATORY_CARE_PROVIDER_SITE_OTHER): Payer: 59 | Admitting: Family Medicine

## 2015-08-17 ENCOUNTER — Encounter: Payer: Self-pay | Admitting: Family Medicine

## 2015-08-17 VITALS — BP 128/80 | Ht 66.0 in | Wt 321.5 lb

## 2015-08-17 DIAGNOSIS — Z029 Encounter for administrative examinations, unspecified: Secondary | ICD-10-CM

## 2015-08-17 DIAGNOSIS — E119 Type 2 diabetes mellitus without complications: Secondary | ICD-10-CM | POA: Diagnosis not present

## 2015-08-17 DIAGNOSIS — E039 Hypothyroidism, unspecified: Secondary | ICD-10-CM

## 2015-08-17 DIAGNOSIS — E785 Hyperlipidemia, unspecified: Secondary | ICD-10-CM | POA: Diagnosis not present

## 2015-08-17 LAB — POCT GLYCOSYLATED HEMOGLOBIN (HGB A1C): Hemoglobin A1C: 5.5

## 2015-08-17 MED ORDER — ALPRAZOLAM 1 MG PO TABS: 1.0000 mg | ORAL_TABLET | Freq: Three times a day (TID) | ORAL | Status: DC | PRN

## 2015-08-17 NOTE — Progress Notes (Signed)
   Subjective:    Patient ID: Amy Hunter, female    DOB: [birth year], 55 y.o.   MRN: [id number]  Diabetes She presents for her follow-up diabetic visit. She has type 2 diabetes mellitus. No MedicAlert identification noted. She has not had a previous visit with a dietitian. She does not see a podiatrist.Eye exam is not current.   Results for orders placed or performed in visit on 08/17/15  POCT glycosylated hemoglobin (Hb A1C)  Result Value Ref Range   Hemoglobin A1C 5.5    Patient does have a history of diabetes. She does do a good job using her Victoza. She does do a good job watching diet staying physically active she has been losing weight watching diet and minimizing portions in addition to this she states not having any setbacks no chest tightness pressure pain or shortness of breath recently she does have morbid obesity she is trying to lose weight she also has hyperlipidemia she takes her medicine she needs new labs previous labs reviewed with the patient thyroid she has she takes medicine she needs a new lab work for this.  Review of Systems Denies chest tightness pressure pain shortness breath nausea from diarrhea high fever chills or joint pains    Objective:   Physical Exam  Lungs clear hearts regular pulse normal BP good extremities no edema skin warm dry neurologic gross normal foot exam normal      Assessment & Plan:  Morbid obesity she is losing weight she needs to continue eating healthy and trying to lose weight  Diabetes very good control A1c looks good Patient was encouraged to get eye exam Hypothyroidism she does need to do lab work to look at her thyroid function await the results of this  Hyperlipidemia previous lab work reviewed with the patient patient needs to do additional blood work  History of venous insufficiency in the legs I find no evidence of cellulitis or excessive swelling in her legs.  Stress patient under a lot of stress with her husband  sickness but she does not feel like she needs to do anything special she uses nerve pills when necessary denies any drowsiness with it denies being depressed does not want to talk about end-of-life issues with her husband today  Follow-up 5 months

## 2015-08-27 ENCOUNTER — Other Ambulatory Visit: Payer: Self-pay | Admitting: Family Medicine

## 2015-08-27 MED FILL — TORSEMIDE 20 MG TABLET: 20 | 30 days supply | Qty: 180 | Fill #0

## 2015-08-27 MED FILL — KLOR-CON M20 TABLET: 20 | 30 days supply | Qty: 60 | Fill #2

## 2015-08-30 ENCOUNTER — Other Ambulatory Visit: Payer: Self-pay | Admitting: Family Medicine

## 2015-08-30 MED FILL — UNIFINE PENTIPS 8MM 31G: 31G X 8 MM | 90 days supply | Qty: 100 | Fill #1

## 2015-08-30 MED FILL — METOPROLOL SUCC ER 50 MG TA: 50 | 30 days supply | Qty: 30 | Fill #2

## 2015-08-30 NOTE — Telephone Encounter (Signed)
Patient may have request plus 4 additional refills

## 2015-08-31 MED FILL — PRAVASTATIN SODIUM 20 MG TA: 20 | 30 days supply | Qty: 30 | Fill #0

## 2015-08-31 MED FILL — NAPROXEN 500 MG TABLET: 500 | 30 days supply | Qty: 60 | Fill #0

## 2015-08-31 MED FILL — LISINOPRIL 2.5 MG TABLET: 2.5 | 30 days supply | Qty: 30 | Fill #0

## 2015-09-05 ENCOUNTER — Other Ambulatory Visit: Payer: Self-pay | Admitting: Family Medicine

## 2015-09-05 MED FILL — LEVOTHYROXINE 200 MCG TAB: 200 | 30 days supply | Qty: 36 | Fill #0

## 2015-09-08 DIAGNOSIS — E039 Hypothyroidism, unspecified: Secondary | ICD-10-CM | POA: Diagnosis not present

## 2015-09-08 DIAGNOSIS — E785 Hyperlipidemia, unspecified: Secondary | ICD-10-CM | POA: Diagnosis not present

## 2015-09-08 DIAGNOSIS — E119 Type 2 diabetes mellitus without complications: Secondary | ICD-10-CM | POA: Diagnosis not present

## 2015-09-09 LAB — BASIC METABOLIC PANEL
BUN/Creatinine Ratio: 28 — ABNORMAL HIGH (ref 9–23)
BUN: 20 mg/dL (ref 6–24)
CALCIUM: 8.7 mg/dL (ref 8.7–10.2)
CO2: 23 mmol/L (ref 18–29)
Chloride: 104 mmol/L (ref 96–106)
Creatinine, Ser: 0.71 mg/dL (ref 0.57–1.00)
GFR, EST AFRICAN AMERICAN: 111 mL/min/{1.73_m2} (ref >59)
GFR, EST NON AFRICAN AMERICAN: 96 mL/min/{1.73_m2} (ref >59)
Glucose: 119 mg/dL — ABNORMAL HIGH (ref 65–99)
POTASSIUM: 4.1 mmol/L (ref 3.5–5.2)
Sodium: 143 mmol/L (ref 134–144)

## 2015-09-09 LAB — LIPID PANEL
CHOLESTEROL TOTAL: 145 mg/dL (ref 100–199)
Chol/HDL Ratio: 3.6 ratio units (ref 0.0–4.4)
HDL: 40 mg/dL (ref >39)
LDL CALC: 84 mg/dL (ref 0–99)
Triglycerides: 104 mg/dL (ref 0–149)
VLDL Cholesterol Cal: 21 mg/dL (ref 5–40)

## 2015-09-09 LAB — HEPATIC FUNCTION PANEL
ALBUMIN: 3.9 g/dL (ref 3.5–5.5)
ALT: 30 IU/L (ref 0–32)
AST: 25 IU/L (ref 0–40)
Alkaline Phosphatase: 157 IU/L — ABNORMAL HIGH (ref 39–117)
BILIRUBIN TOTAL: 0.3 mg/dL (ref 0.0–1.2)
Bilirubin, Direct: 0.13 mg/dL (ref 0.00–0.40)
Total Protein: 6.6 g/dL (ref 6.0–8.5)

## 2015-09-09 LAB — TSH: TSH: 0.518 u[IU]/mL (ref 0.450–4.500)

## 2015-09-09 LAB — MICROALBUMIN / CREATININE URINE RATIO
Creatinine, Urine: 39.2 mg/dL
MICROALB/CREAT RATIO: 11 mg/g{creat} (ref 0.0–30.0)
MICROALBUM., U, RANDOM: 4.3 ug/mL

## 2015-09-18 MED FILL — KLOR-CON M20 TABLET: 20 | 30 days supply | Qty: 60 | Fill #3

## 2015-09-28 ENCOUNTER — Ambulatory Visit (HOSPITAL_COMMUNITY)
Admission: RE | Admit: 2015-09-28 | Discharge: 2015-09-28 | Disposition: A | Discharge status: Home / Self Care | Payer: 59 | Source: Ambulatory Visit | Attending: Family Medicine | Admitting: Family Medicine

## 2015-09-28 ENCOUNTER — Other Ambulatory Visit: Payer: Self-pay | Admitting: Family Medicine

## 2015-09-28 ENCOUNTER — Ambulatory Visit: Payer: 59

## 2015-09-28 DIAGNOSIS — Z1231 Encounter for screening mammogram for malignant neoplasm of breast: Secondary | ICD-10-CM | POA: Diagnosis not present

## 2015-10-04 MED FILL — METOPROLOL SUCC ER 50 MG TA: 50 | 30 days supply | Qty: 30 | Fill #3

## 2015-10-04 MED FILL — NAPROXEN 500 MG TABLET: 500 | 30 days supply | Qty: 60 | Fill #1

## 2015-10-04 MED FILL — LISINOPRIL 2.5 MG TABLET: 2.5 | 30 days supply | Qty: 30 | Fill #1

## 2015-10-04 MED FILL — TORSEMIDE 20 MG TABLET: 20 | 30 days supply | Qty: 180 | Fill #1

## 2015-10-11 MED FILL — MOMETASONE FUROATE 0.1% CRM: 0.1 | 30 days supply | Qty: 45 | Fill #1

## 2015-10-11 MED FILL — VICTOZA 18 MG/3 ML INJECT P: 18 | 90 days supply | Qty: 27 | Fill #3

## 2015-10-17 ENCOUNTER — Other Ambulatory Visit: Payer: Self-pay | Admitting: Family Medicine

## 2015-10-17 MED FILL — LEVOTHYROXINE 200 MCG TAB: 200 | 30 days supply | Qty: 36 | Fill #0

## 2015-10-22 MED FILL — COLCHICINE 0.6 MG TABLET: 0.6 | 10 days supply | Qty: 20 | Fill #0

## 2015-10-23 MED FILL — PRAVASTATIN SODIUM 20 MG TA: 20 | 30 days supply | Qty: 30 | Fill #1

## 2015-11-01 MED FILL — NAPROXEN 500 MG TABLET: 500 | 30 days supply | Qty: 60 | Fill #2

## 2015-11-01 MED FILL — METOPROLOL SUCC ER 50 MG TA: 50 | 30 days supply | Qty: 30 | Fill #4

## 2015-11-07 ENCOUNTER — Other Ambulatory Visit: Payer: Self-pay | Admitting: Family Medicine

## 2015-11-07 MED FILL — LISINOPRIL 2.5 MG TABLET: 2.5 | 90 days supply | Qty: 90 | Fill #2

## 2015-11-07 MED FILL — COLCHICINE 0.6 MG TABLET: 0.6 | 10 days supply | Qty: 20 | Fill #0

## 2015-11-08 MED FILL — KLOR-CON M20 TABLET: 20 | 30 days supply | Qty: 60 | Fill #4

## 2015-11-13 ENCOUNTER — Telehealth: Payer: Self-pay | Admitting: Family Medicine

## 2015-11-13 MED ORDER — TORSEMIDE 20 MG PO TABS: ORAL_TABLET | ORAL | 2 refills | Status: DC

## 2015-11-13 MED FILL — TORSEMIDE 20 MG TABLET: 20 | 30 days supply | Qty: 180 | Fill #0

## 2015-11-13 NOTE — Telephone Encounter (Signed)
Pt is needing a refill on her torsemide (DEMADEX) 20 MG tablet   [facility]

## 2015-11-13 NOTE — Telephone Encounter (Signed)
Notified patient refill sent to pharmacy.  

## 2015-11-19 MED FILL — LEVOTHYROXINE 200 MCG TAB: 200 | 30 days supply | Qty: 36 | Fill #1

## 2015-11-19 MED FILL — PRAVASTATIN SODIUM 20 MG TA: 20 | 30 days supply | Qty: 30 | Fill #2

## 2015-11-21 ENCOUNTER — Other Ambulatory Visit: Payer: Self-pay | Admitting: *Deleted

## 2015-11-21 MED ORDER — COLCHICINE 0.6 MG PO TABS: ORAL_TABLET | ORAL | 6 refills | Status: DC

## 2015-11-21 MED FILL — COLCHICINE 0.6 MG TABLET: 0.6 | 10 days supply | Qty: 20 | Fill #0

## 2015-11-21 NOTE — Progress Notes (Signed)
rf times 6 

## 2015-12-03 MED FILL — KLOR-CON M20 TABLET: 20 | 30 days supply | Qty: 60 | Fill #5

## 2015-12-03 MED FILL — NAPROXEN 500 MG TABLET: 500 | 30 days supply | Qty: 60 | Fill #3

## 2015-12-03 MED FILL — METOPROLOL SUCC ER 50 MG TA: 50 | 30 days supply | Qty: 30 | Fill #5

## 2015-12-03 MED FILL — COLCHICINE 0.6 MG TABLET: 0.6 | 10 days supply | Qty: 20 | Fill #1

## 2015-12-11 MED FILL — UNIFINE PENTIPS 8MM 31G: 31G X 8 MM | 90 days supply | Qty: 100 | Fill #2

## 2015-12-17 MED FILL — TORSEMIDE 20 MG TABLET: 20 | 30 days supply | Qty: 180 | Fill #1

## 2015-12-20 MED FILL — COLCHICINE 0.6 MG TABLET: 0.6 | 10 days supply | Qty: 20 | Fill #2

## 2015-12-20 MED FILL — PRAVASTATIN SODIUM 20 MG TA: 20 | 30 days supply | Qty: 30 | Fill #3

## 2016-01-01 ENCOUNTER — Other Ambulatory Visit: Payer: Self-pay | Admitting: Family Medicine

## 2016-01-01 MED FILL — NAPROXEN 500 MG TABLET: 500 | 30 days supply | Qty: 60 | Fill #4

## 2016-01-01 MED FILL — KLOR-CON M20 TABLET: 20 | 30 days supply | Qty: 60 | Fill #0

## 2016-01-01 MED FILL — METOPROLOL SUCC ER 50 MG TA: 50 | 90 days supply | Qty: 90 | Fill #0

## 2016-01-01 MED FILL — LEVOTHYROXINE 200 MCG TAB: 200 | 30 days supply | Qty: 36 | Fill #2

## 2016-01-07 ENCOUNTER — Other Ambulatory Visit: Payer: Self-pay | Admitting: Family Medicine

## 2016-01-07 MED FILL — VICTOZA 18 MG/3 ML INJECT P: 18 | 90 days supply | Qty: 27 | Fill #0

## 2016-01-18 ENCOUNTER — Ambulatory Visit (INDEPENDENT_AMBULATORY_CARE_PROVIDER_SITE_OTHER): Payer: 59 | Admitting: Nurse Practitioner

## 2016-01-18 ENCOUNTER — Encounter: Payer: Self-pay | Admitting: Nurse Practitioner

## 2016-01-18 VITALS — BP 130/80 | Temp 97.6°F | Ht 66.0 in

## 2016-01-18 DIAGNOSIS — J310 Chronic rhinitis: Secondary | ICD-10-CM

## 2016-01-18 DIAGNOSIS — J329 Chronic sinusitis, unspecified: Secondary | ICD-10-CM | POA: Diagnosis not present

## 2016-01-18 MED ORDER — HYDROCODONE-HOMATROPINE 5-1.5 MG/5ML PO SYRP: 5.0000 mL | ORAL_SOLUTION | ORAL | 0 refills | Status: DC | PRN

## 2016-01-18 MED ORDER — CEFDINIR 300 MG PO CAPS: 300.0000 mg | ORAL_CAPSULE | Freq: Two times a day (BID) | ORAL | 0 refills | Status: DC

## 2016-01-19 ENCOUNTER — Encounter: Payer: Self-pay | Admitting: Nurse Practitioner

## 2016-01-19 NOTE — Progress Notes (Signed)
Subjective:  Presents for c/o sore throat and congestion x 1 1/2 weeks. No fever. Head congestion. Frequent cough producing clear sputum. No wheezing. Ear pain.   Objective:   BP 130/80   Temp 97.6 F (36.4 C) (Oral)   Ht 5\' 6"  (1.676 m)  NAD. Alert, oriented. TMs clear effusion. Posterior pharynx erythema with green PND noted. Neck supple with mild anterior adenopathy. Lungs clear. Heart RRR.   Assessment: Rhinosinusitis  Plan:  Meds ordered this encounter  Medications  . HYDROcodone-homatropine (HYCODAN) 5-1.5 MG/5ML syrup    Sig: Take 5 mLs by mouth every 4 (four) hours as needed.    Dispense:  120 mL    Refill:  0    Order Specific Question:   Supervising Provider    Answer:   Merlyn AlbertLUKING, WILLIAM S [[id number]]  . cefdinir (OMNICEF) 300 MG capsule    Sig: Take 1 capsule (300 mg total) by mouth 2 (two) times daily.    Dispense:  20 capsule    Refill:  0    Order Specific Question:   Supervising Provider    Answer:   Merlyn AlbertLUKING, WILLIAM S [[id number]]   Use hycodan at night. Drowsiness precautions. OTC meds as directed. Call back if worsens or persists.

## 2016-01-29 MED FILL — PRAVASTATIN SODIUM 20 MG TA: 20 | 30 days supply | Qty: 30 | Fill #4

## 2016-01-29 MED FILL — LEVOTHYROXINE 200 MCG TAB: 200 | 30 days supply | Qty: 36 | Fill #3

## 2016-01-29 MED FILL — KLOR-CON M20 TABLET: 20 | 30 days supply | Qty: 60 | Fill #1

## 2016-01-29 MED FILL — TORSEMIDE 20 MG TABLET: 20 | 30 days supply | Qty: 180 | Fill #2

## 2016-02-04 ENCOUNTER — Other Ambulatory Visit: Payer: Self-pay | Admitting: Family Medicine

## 2016-02-04 MED FILL — COLCHICINE 0.6 MG TABLET: 0.6 | 10 days supply | Qty: 20 | Fill #3

## 2016-02-05 MED FILL — NAPROXEN 500 MG TABLET: 500 | 30 days supply | Qty: 60 | Fill #0

## 2016-02-05 MED FILL — LISINOPRIL 2.5 MG TABLET: 2.5 | 30 days supply | Qty: 30 | Fill #0

## 2016-02-05 NOTE — Telephone Encounter (Signed)
May have this plus one additional refill needs office visit 

## 2016-02-19 MED FILL — COLCHICINE 0.6 MG TABLET: 0.6 | 10 days supply | Qty: 20 | Fill #4

## 2016-02-26 MED FILL — KLOR-CON M20 TABLET: 20 | 30 days supply | Qty: 60 | Fill #2

## 2016-02-27 ENCOUNTER — Other Ambulatory Visit: Payer: Self-pay | Admitting: *Deleted

## 2016-02-27 MED ORDER — TORSEMIDE 20 MG PO TABS: ORAL_TABLET | ORAL | 0 refills | Status: DC

## 2016-02-27 MED ORDER — PRAVASTATIN SODIUM 20 MG PO TABS: 20.0000 mg | ORAL_TABLET | Freq: Every evening | ORAL | 0 refills | Status: DC

## 2016-02-27 MED FILL — PRAVASTATIN SODIUM 20 MG TA: 20 | 30 days supply | Qty: 30 | Fill #0

## 2016-02-27 MED FILL — TORSEMIDE 20 MG TABLET: 20 | 20 days supply | Qty: 180 | Fill #0

## 2016-03-11 ENCOUNTER — Encounter: Payer: Self-pay | Admitting: Family Medicine

## 2016-03-11 ENCOUNTER — Ambulatory Visit (INDEPENDENT_AMBULATORY_CARE_PROVIDER_SITE_OTHER): Payer: 59 | Admitting: Family Medicine

## 2016-03-11 VITALS — BP 132/82 | Temp 98.1°F | Ht 66.0 in | Wt 324.6 lb

## 2016-03-11 DIAGNOSIS — J101 Influenza due to other identified influenza virus with other respiratory manifestations: Secondary | ICD-10-CM

## 2016-03-11 MED ORDER — OSELTAMIVIR PHOSPHATE 75 MG PO CAPS: 75.0000 mg | ORAL_CAPSULE | Freq: Two times a day (BID) | ORAL | 0 refills | Status: AC

## 2016-03-11 MED ORDER — ONDANSETRON 4 MG PO TBDP: 4.0000 mg | ORAL_TABLET | Freq: Three times a day (TID) | ORAL | 0 refills | Status: DC | PRN

## 2016-03-11 NOTE — Progress Notes (Signed)
   Subjective:    Patient ID: Dominga FerrySandra W Cotugno, female    DOB: 03/28/1960, 56 y.o.   MRN: [id number]  Cough  This is a new problem. The current episode started in the past 7 days. Associated symptoms include headaches and nasal congestion. Associated symptoms comments: nausea. Treatments tried: zofran and tylenol.   Nausea and headache   Runny nose adnshoulders and back aching bad  Rare cough  Energy level tod not good   Took zofran prn   advi ok  Diminished energy diminished appetite and aching    Review of Systems  Respiratory: Positive for cough.   Neurological: Positive for headaches.       Objective:   Physical Exam Alert vitals reviewed, moderate malaise. Hydration good. Positive nasal congestion lungs no crackles or wheezes, no tachypnea, intermittent bronchial cough during exam heart regular rate and rhythm.        Assessment & Plan:  Impression influenza discussed at length. Ashby Dawesature of illness and potential sequela discussed. Plan Tamiflu prescribed if indicated and timing appropriate. Symptom care discussed. Warning signs discussed. WSL

## 2016-03-12 MED FILL — LISINOPRIL 2.5 MG TABLET: 2.5 | 30 days supply | Qty: 30 | Fill #1

## 2016-03-12 MED FILL — NAPROXEN 500 MG TABLET: 500 | 30 days supply | Qty: 60 | Fill #1

## 2016-03-12 MED FILL — LEVOTHYROXINE 200 MCG TAB: 200 | 30 days supply | Qty: 36 | Fill #4

## 2016-03-31 ENCOUNTER — Other Ambulatory Visit: Payer: Self-pay | Admitting: Family Medicine

## 2016-03-31 MED FILL — KLOR-CON M20 TABLET: 20 | 30 days supply | Qty: 60 | Fill #3

## 2016-03-31 MED FILL — PRAVASTATIN SODIUM 20 MG TA: 20 | 30 days supply | Qty: 30 | Fill #0

## 2016-03-31 MED FILL — METOPROLOL SUCC ER 50 MG TA: 50 | 90 days supply | Qty: 90 | Fill #1

## 2016-03-31 MED FILL — TORSEMIDE 20 MG TABLET: 20 | 30 days supply | Qty: 180 | Fill #0

## 2016-04-02 ENCOUNTER — Ambulatory Visit (INDEPENDENT_AMBULATORY_CARE_PROVIDER_SITE_OTHER): Payer: 59 | Admitting: Family Medicine

## 2016-04-02 ENCOUNTER — Encounter: Payer: Self-pay | Admitting: Family Medicine

## 2016-04-02 VITALS — BP 124/86 | Ht 66.0 in | Wt 325.0 lb

## 2016-04-02 DIAGNOSIS — E7849 Other hyperlipidemia: Secondary | ICD-10-CM

## 2016-04-02 DIAGNOSIS — E1149 Type 2 diabetes mellitus with other diabetic neurological complication: Secondary | ICD-10-CM | POA: Diagnosis not present

## 2016-04-02 DIAGNOSIS — E784 Other hyperlipidemia: Secondary | ICD-10-CM

## 2016-04-02 DIAGNOSIS — Z79899 Other long term (current) drug therapy: Secondary | ICD-10-CM

## 2016-04-02 DIAGNOSIS — E039 Hypothyroidism, unspecified: Secondary | ICD-10-CM | POA: Diagnosis not present

## 2016-04-02 DIAGNOSIS — E119 Type 2 diabetes mellitus without complications: Secondary | ICD-10-CM | POA: Diagnosis not present

## 2016-04-02 LAB — POCT GLYCOSYLATED HEMOGLOBIN (HGB A1C): HEMOGLOBIN A1C: 5.4

## 2016-04-02 MED ORDER — PRAVASTATIN SODIUM 20 MG PO TABS: 20.0000 mg | ORAL_TABLET | Freq: Every evening | ORAL | 1 refills | Status: DC

## 2016-04-02 MED ORDER — METOPROLOL SUCCINATE ER 50 MG PO TB24: ORAL_TABLET | ORAL | 1 refills | Status: DC

## 2016-04-02 MED ORDER — POTASSIUM CHLORIDE CRYS ER 20 MEQ PO TBCR: 20.0000 meq | EXTENDED_RELEASE_TABLET | Freq: Two times a day (BID) | ORAL | 3 refills | Status: DC

## 2016-04-02 MED ORDER — TORSEMIDE 20 MG PO TABS: ORAL_TABLET | ORAL | 1 refills | Status: DC

## 2016-04-02 MED ORDER — LISINOPRIL 2.5 MG PO TABS: 2.5000 mg | ORAL_TABLET | Freq: Every morning | ORAL | 3 refills | Status: DC

## 2016-04-02 MED ORDER — ALPRAZOLAM 1 MG PO TABS: 1.0000 mg | ORAL_TABLET | Freq: Three times a day (TID) | ORAL | 5 refills | Status: DC | PRN

## 2016-04-02 MED ORDER — LEVOTHYROXINE SODIUM 200 MCG PO TABS: ORAL_TABLET | ORAL | 3 refills | Status: DC

## 2016-04-02 MED FILL — ALPRAZolam 1 MG TABS: 1 | 10 days supply | Qty: 30 | Fill #0

## 2016-04-02 NOTE — Progress Notes (Signed)
   Subjective:    Patient ID: Amy FerrySandra W Hunter, female    DOB: Jan 18, 1961, 56 y.o.   MRN: [id number]  Diabetes  She presents for her follow-up diabetic visit. She has type 2 diabetes mellitus. She is compliant with treatment all of the time. Diabetic current diet: health conscious with diet. Home blood sugar record trend: 112 -140. She sees a podiatrist.Eye exam is current.   Taking victoza. Need to have change to due cost.  Patient relates medication was $75 a month now it's 375 a month she states she is not able to afford this she has not talked with her employer at this point to find out what other options there are  She is under a fair amount of stress she denies being depressed her husband has end-stage COPD Needs refill on xanax.  She takes her thyroid medicine states energy level overall doing fairly good Takes her diabetic medicine states sugars been doing good Morbid obesity she does try to watch her diet tries to stay physically active as possible has difficult time losing weight She takes her cholesterol medicine without trouble Takes blood pressure medicine without trouble Has not had any respiratory flareups      Review of Systems She denies chest tightness pressure pain shortness breath nausea vomiting diarrhea fever chills sweats    Objective:   Physical Exam Lungs clear no crackles heart is regular pulse normal BP good morbid obesity noted extremities no edema skin warm dry diabetic foot exam completed some neuropathy in the feet       Assessment & Plan:  Diabetic peripheral neuropathy-proper foot care was discussed in detail if her insurance covers diabetic shoes we will be happy to prescribe them  Diabetes good control currently she will look into the lower cost choices other than Victoza she will let us know may end up having to go back to either glipizide or metformin  Hyperlipidemia continue cholesterol medicine previous labs reviewed [place]  Thyroid  continue current measures previous labs reviewed await the results of the most current one  Morbid obesity encouraged patient try to lose weight and stay physically active.  Follow-up 6 months  Situational stress related to her husbands end-stage disease and it when necessary patient not depressed

## 2016-04-03 LAB — HEPATIC FUNCTION PANEL
ALT: 36 IU/L — ABNORMAL HIGH (ref 0–32)
AST: 29 IU/L (ref 0–40)
Albumin: 4.2 g/dL (ref 3.5–5.5)
Alkaline Phosphatase: 173 IU/L — ABNORMAL HIGH (ref 39–117)
BILIRUBIN TOTAL: 0.3 mg/dL (ref 0.0–1.2)
Bilirubin, Direct: 0.1 mg/dL (ref 0.00–0.40)
TOTAL PROTEIN: 7.1 g/dL (ref 6.0–8.5)

## 2016-04-03 LAB — BASIC METABOLIC PANEL
BUN / CREAT RATIO: 25 — AB (ref 9–23)
BUN: 19 mg/dL (ref 6–24)
CO2: 26 mmol/L (ref 18–29)
CREATININE: 0.77 mg/dL (ref 0.57–1.00)
Calcium: 9.3 mg/dL (ref 8.7–10.2)
Chloride: 101 mmol/L (ref 96–106)
GFR, EST AFRICAN AMERICAN: 100 mL/min/{1.73_m2} (ref >59)
GFR, EST NON AFRICAN AMERICAN: 87 mL/min/{1.73_m2} (ref >59)
Glucose: 98 mg/dL (ref 65–99)
Potassium: 4.3 mmol/L (ref 3.5–5.2)
SODIUM: 144 mmol/L (ref 134–144)

## 2016-04-03 LAB — LIPID PANEL
CHOLESTEROL TOTAL: 148 mg/dL (ref 100–199)
Chol/HDL Ratio: 3.5 ratio units (ref 0.0–4.4)
HDL: 42 mg/dL (ref >39)
LDL CALC: 83 mg/dL (ref 0–99)
Triglycerides: 113 mg/dL (ref 0–149)
VLDL CHOLESTEROL CAL: 23 mg/dL (ref 5–40)

## 2016-04-03 LAB — TSH: TSH: 1.72 u[IU]/mL (ref 0.450–4.500)

## 2016-04-14 ENCOUNTER — Other Ambulatory Visit: Payer: Self-pay | Admitting: Family Medicine

## 2016-04-14 MED FILL — NAPROXEN 500 MG TABLET: 500 | 30 days supply | Qty: 60 | Fill #0

## 2016-04-14 MED FILL — LEVOTHYROXINE 200 MCG TAB: 200 | 30 days supply | Qty: 36 | Fill #5

## 2016-04-14 MED FILL — LISINOPRIL 2.5 MG TABLET: 2.5 | 90 days supply | Qty: 90 | Fill #0

## 2016-04-28 MED FILL — KLOR-CON M20 TABLET: 20 | 30 days supply | Qty: 60 | Fill #4

## 2016-04-28 MED FILL — TORSEMIDE 20 MG TABLET: 20 | 90 days supply | Qty: 540 | Fill #0

## 2016-04-28 MED FILL — PRAVASTATIN SODIUM 20 MG TA: 20 | 90 days supply | Qty: 90 | Fill #0

## 2016-05-05 MED FILL — COLCHICINE 0.6 MG TABLET: 0.6 | 10 days supply | Qty: 20 | Fill #5

## 2016-05-12 ENCOUNTER — Other Ambulatory Visit: Payer: Self-pay | Admitting: Family Medicine

## 2016-05-12 MED FILL — MOMETASONE FUROATE 0.1% CRM: 0.1 | 15 days supply | Qty: 45 | Fill #0

## 2016-05-12 MED FILL — NAPROXEN 500 MG TABLET: 500 | 30 days supply | Qty: 60 | Fill #1

## 2016-05-12 MED FILL — LEVOTHYROXINE 200 MCG TAB: 200 | 90 days supply | Qty: 108 | Fill #0

## 2016-05-16 ENCOUNTER — Telehealth: Payer: Self-pay | Admitting: *Deleted

## 2016-05-16 MED FILL — UNIFINE PENTIPS 8MM 31G: 31G X 8 MM | 90 days supply | Qty: 100 | Fill #3

## 2016-05-16 NOTE — Telephone Encounter (Signed)
Fax from [place] outpt pharm. victoza is being rejected by insurance. Step therapy with actos or actos combination has to be tried first.

## 2016-05-20 ENCOUNTER — Telehealth: Payer: Self-pay | Admitting: Family Medicine

## 2016-05-20 NOTE — Telephone Encounter (Signed)
Called patient insurance and submitted a Urgent Prior Authorization request for patient's Victoza. Awaiting response. Was told by patient's insurance that it may take up to 24 hours to get a response. Left message return call to inform patient.

## 2016-05-21 MED FILL — VICTOZA 18 MG/3 ML INJECT P: 18 | 30 days supply | Qty: 9 | Fill #1

## 2016-05-21 NOTE — Telephone Encounter (Signed)
Spoke with patient and informed her per that her Victoza is approved through [employer] her prescription coverage  from 05/20/2016- 05/19/2017. Patient verbalized understanding

## 2016-05-21 NOTE — Telephone Encounter (Signed)
Spoke with patient and informed her that we are awaiting response from her insurance on prior auth for Victoza. Patient verbalized understanding.

## 2016-05-21 NOTE — Telephone Encounter (Signed)
Please let the patient know that her pharmacy/insurance is no longer covering Victoza. They are covering Actos but I am concerned regarding potential side effects of Actos. I would recommend that the patient have a standard office visit with me somewhere within the next 2 weeks to discuss whether or not she would want to go to this or try different medication

## 2016-05-22 NOTE — Telephone Encounter (Signed)
I worked on Clinical research associate for [employer] and medication was approved. Patient is aware and pharmacy.

## 2016-05-22 NOTE — Telephone Encounter (Signed)
Good job!!

## 2016-06-24 ENCOUNTER — Other Ambulatory Visit: Payer: Self-pay | Admitting: Family Medicine

## 2016-06-24 MED FILL — POTASSIUM CL ER 20 MEQ TABL: 20 | 30 days supply | Qty: 60 | Fill #5

## 2016-06-25 MED FILL — NAPROXEN 500 MG TABLET: 500 | 30 days supply | Qty: 60 | Fill #0

## 2016-07-01 MED FILL — VICTOZA 18 MG/3 ML INJECT P: 18 | 30 days supply | Qty: 9 | Fill #2

## 2016-07-09 ENCOUNTER — Other Ambulatory Visit (HOSPITAL_COMMUNITY)
Admission: RE | Admit: 2016-07-09 | Discharge: 2016-07-09 | Disposition: A | Discharge status: Home / Self Care | Payer: 59 | Source: Ambulatory Visit | Attending: *Deleted | Admitting: *Deleted

## 2016-07-09 ENCOUNTER — Encounter: Payer: Self-pay | Admitting: Family Medicine

## 2016-07-09 ENCOUNTER — Ambulatory Visit (INDEPENDENT_AMBULATORY_CARE_PROVIDER_SITE_OTHER): Payer: 59 | Admitting: Family Medicine

## 2016-07-09 ENCOUNTER — Ambulatory Visit (HOSPITAL_COMMUNITY)
Admission: RE | Admit: 2016-07-09 | Discharge: 2016-07-09 | Disposition: A | Discharge status: Home / Self Care | Payer: 59 | Source: Ambulatory Visit | Attending: Family Medicine | Admitting: Family Medicine

## 2016-07-09 VITALS — BP 122/80 | Temp 97.7°F | Ht 66.0 in

## 2016-07-09 DIAGNOSIS — R600 Localized edema: Secondary | ICD-10-CM | POA: Diagnosis not present

## 2016-07-09 DIAGNOSIS — M7989 Other specified soft tissue disorders: Secondary | ICD-10-CM | POA: Diagnosis not present

## 2016-07-09 DIAGNOSIS — R7989 Other specified abnormal findings of blood chemistry: Secondary | ICD-10-CM

## 2016-07-09 DIAGNOSIS — L03115 Cellulitis of right lower limb: Secondary | ICD-10-CM

## 2016-07-09 LAB — D-DIMER, QUANTITATIVE (NOT AT ARMC): D DIMER QUANT: 0.58 ug{FEU}/mL — AB (ref 0.00–0.50)

## 2016-07-09 MED ORDER — DOXYCYCLINE HYCLATE 100 MG PO TABS: 100.0000 mg | ORAL_TABLET | Freq: Two times a day (BID) | ORAL | 0 refills | Status: DC

## 2016-07-09 MED FILL — COLCHICINE 0.6 MG TABLET: 0.6 | 10 days supply | Qty: 20 | Fill #6

## 2016-07-09 MED FILL — METOPROLOL SUCC ER 50 MG TA: 50 | 90 days supply | Qty: 90 | Fill #0

## 2016-07-09 MED FILL — LISINOPRIL 2.5 MG TABLET: 2.5 | 90 days supply | Qty: 90 | Fill #1

## 2016-07-09 NOTE — Progress Notes (Signed)
   Subjective:    Patient ID: Amy Hunter, female    DOB: 11/09/1960, 56 y.o.   MRN: [id number]  HPIBilateral leg swelling and redness. Pos hx of cellulitis in the past   Last wk leg started to swell and it felt like it was on fire, tendr spot, deep ache  No fever, but slight chills  Little nausea off and on   Started one week ago.   Nausea. Started 5 days ago. Taking zofran.   Patient has history of cellulitis in the past. No obvious fever chills. But did notice slight chills last night. Also some slight nausea.  Leg has become progressively tender. Next  Positive history of venous stasis but no true history of DVT  Review of Systems No headache, no major weight loss or weight gain, no chest pain no back pain abdominal pain no change in bowel habits complete ROS otherwise negative     Objective:   Physical Exam  Alert and oriented, vitals reviewed and stable, NAD ENT-TM's and ext canals WNL bilat via otoscopic exam Soft palate, tonsils and post pharynx WNL via oropharyngeal exam Neck-symmetric, no masses; thyroid nonpalpable and nontender Pulmonary-no tachypnea or accessory muscle use; Clear without wheezes via auscultation Card--no abnrml murmurs, rhythm reg and rate WNL Carotid pulses symmetric, without bruits Morbid obesity present  Right anterior leg tender erythematous region with some slight induration. Negative Homans sign      Assessment & Plan:  Sent for urgent labs. D-dimer elevated. Ultrasound negative. Next  Probable cellulitis discuss antibiotics initiated. Local measures discussed. Work excuse given. Repeat ultrasound next week due to elevated d-dimer. Rationale discussed. Warning signs discussed carefully

## 2016-07-10 DIAGNOSIS — Z029 Encounter for administrative examinations, unspecified: Secondary | ICD-10-CM

## 2016-07-10 NOTE — Addendum Note (Signed)
Addended by: Drake LeachBROWN, AUTUMN on: 07/10/2016 09:50 AM   Modules accepted: Orders

## 2016-07-11 ENCOUNTER — Telehealth: Payer: Self-pay | Admitting: Family Medicine

## 2016-07-11 NOTE — Telephone Encounter (Signed)
MESSAGE FOR DR.STEVE-Patient had [employer] to fax over FMLA paper to be filled out . Please review highlighted areas and fill in was uncertain about these areas. The rest of form has been done. Date/sign in yellow folder.

## 2016-07-11 NOTE — Telephone Encounter (Signed)
done

## 2016-07-16 ENCOUNTER — Ambulatory Visit (HOSPITAL_COMMUNITY)
Admission: RE | Admit: 2016-07-16 | Discharge: 2016-07-16 | Disposition: A | Discharge status: Home / Self Care | Payer: 59 | Source: Ambulatory Visit | Attending: Family Medicine | Admitting: Family Medicine

## 2016-07-16 DIAGNOSIS — M7989 Other specified soft tissue disorders: Secondary | ICD-10-CM | POA: Insufficient documentation

## 2016-07-16 DIAGNOSIS — R7989 Other specified abnormal findings of blood chemistry: Secondary | ICD-10-CM | POA: Diagnosis not present

## 2016-07-21 ENCOUNTER — Telehealth: Payer: Self-pay | Admitting: Family Medicine

## 2016-07-21 NOTE — Telephone Encounter (Signed)
[employer] wanting more information on condition.I see she was seen in 2017 for similar swelling but in her foot. Please review and fill in highlighted areas so I can fax. In yellow folder.

## 2016-07-23 MED FILL — POTASSIUM CL ER 20 MEQ TABL: 20 | 90 days supply | Qty: 180 | Fill #0

## 2016-07-23 MED FILL — ALPRAZolam 1 MG TABS: 1 | 10 days supply | Qty: 30 | Fill #1

## 2016-07-23 MED FILL — NAPROXEN 500 MG TABLET: 500 | 30 days supply | Qty: 60 | Fill #1

## 2016-08-08 MED FILL — PRAVASTATIN SODIUM 20 MG TA: 20 | 90 days supply | Qty: 90 | Fill #1

## 2016-08-08 MED FILL — TORSEMIDE 20 MG TABLET: 20 | 90 days supply | Qty: 540 | Fill #1

## 2016-08-21 MED FILL — LEVOTHYROXINE 200 MCG TAB: 200 | 90 days supply | Qty: 108 | Fill #1

## 2016-08-27 MED FILL — VICTOZA 18 MG/3 ML INJECT P: 18 | 30 days supply | Qty: 9 | Fill #3

## 2016-09-16 ENCOUNTER — Ambulatory Visit (INDEPENDENT_AMBULATORY_CARE_PROVIDER_SITE_OTHER): Payer: 59 | Admitting: Family Medicine

## 2016-09-16 ENCOUNTER — Telehealth: Payer: Self-pay | Admitting: Family Medicine

## 2016-09-16 ENCOUNTER — Encounter: Payer: Self-pay | Admitting: Family Medicine

## 2016-09-16 VITALS — BP 128/72 | Ht 66.0 in

## 2016-09-16 DIAGNOSIS — E119 Type 2 diabetes mellitus without complications: Secondary | ICD-10-CM

## 2016-09-16 DIAGNOSIS — M25572 Pain in left ankle and joints of left foot: Secondary | ICD-10-CM | POA: Diagnosis not present

## 2016-09-16 DIAGNOSIS — R5383 Other fatigue: Secondary | ICD-10-CM | POA: Diagnosis not present

## 2016-09-16 DIAGNOSIS — M659 Synovitis and tenosynovitis, unspecified: Secondary | ICD-10-CM | POA: Diagnosis not present

## 2016-09-16 DIAGNOSIS — E790 Hyperuricemia without signs of inflammatory arthritis and tophaceous disease: Secondary | ICD-10-CM

## 2016-09-16 LAB — POCT GLYCOSYLATED HEMOGLOBIN (HGB A1C): HEMOGLOBIN A1C: 5.7

## 2016-09-16 MED ORDER — HYDROCODONE-ACETAMINOPHEN 10-325 MG PO TABS: 1.0000 | ORAL_TABLET | ORAL | 0 refills | Status: DC | PRN

## 2016-09-16 MED ORDER — PREDNISONE 20 MG PO TABS: ORAL_TABLET | ORAL | 0 refills | Status: DC

## 2016-09-16 MED ORDER — COLCHICINE 0.6 MG PO TABS: ORAL_TABLET | ORAL | 6 refills | Status: DC

## 2016-09-16 MED FILL — predniSONE 20 MG TABS: 20 | 6 days supply | Qty: 12 | Fill #0

## 2016-09-16 MED FILL — COLCHICINE 0.6 MG TABLET: 0.6 | 10 days supply | Qty: 20 | Fill #0

## 2016-09-16 NOTE — Telephone Encounter (Signed)
Patient had [facility] today [facility].  [facility] [place] because she has to start them today.  Please advise.

## 2016-09-16 NOTE — Telephone Encounter (Signed)
Prescriptions sent electronically to pharmacy. Patient notified. °

## 2016-09-16 NOTE — Progress Notes (Addendum)
   Subjective:    Patient ID: Amy Hunter, female    DOB: 1960/12/11, 56 y.o.   MRN: [id number]  Diabetes  She presents for her follow-up diabetic visit. She has type 2 diabetes mellitus. She is following a generally healthy diet. She participates in exercise intermittently. Eye exam is current.  on [place]. Pt here today for left foot and right hand pain. She thinks it is gout.Pain started yesterday , has taken tylenol and it has not help. 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 Review of Systems Relates joint pains discomfort denies chest tightness pressure pain shortness breath nausea vomiting diarrhea denies wheezing difficulty breathing denies low sugar spells.    Objective:   Physical Exam  Lungs clear heart regular diabetic foot exam normal no gallop noted subjected discomfort left ankle also in the right index and middle finger MTP  No redness    Assessment & Plan:  Diabetes good control A1c is better than what I think it should be so we will repeat an A1c  25 minutes was spent with the patient. Greater than half the time was spent in discussion and answering questions and counseling regarding the issues that the patient came in for today.  Senna bites along with arthralgia left ankle suspicious for possibility rheumatoid arthritis will go ahead and do lab work  Short course prednisone and colchicine  Referral to rheumatology  Pain medication for home use only half tablet to a whole tablet as needed  Patient will resubmit paperwork for FMLA to include arthritis

## 2016-09-18 LAB — CBC WITH DIFFERENTIAL/PLATELET
Basophils Absolute: 0 10*3/uL (ref 0.0–0.2)
Basos: 0 %
EOS (ABSOLUTE): 0.2 10*3/uL (ref 0.0–0.4)
Eos: 2 %
Hematocrit: 41 % (ref 34.0–46.6)
Hemoglobin: 13.4 g/dL (ref 11.1–15.9)
IMMATURE GRANULOCYTES: 0 %
Immature Grans (Abs): 0 10*3/uL (ref 0.0–0.1)
LYMPHS: 27 %
Lymphocytes Absolute: 2.8 10*3/uL (ref 0.7–3.1)
MCH: 26.2 pg — ABNORMAL LOW (ref 26.6–33.0)
MCHC: 32.7 g/dL (ref 31.5–35.7)
MCV: 80 fL (ref 79–97)
MONOS ABS: 1 10*3/uL — AB (ref 0.1–0.9)
Monocytes: 9 %
NEUTROS PCT: 62 %
Neutrophils Absolute: 6.6 10*3/uL (ref 1.4–7.0)
PLATELETS: 289 10*3/uL (ref 150–379)
RBC: 5.12 x10E6/uL (ref 3.77–5.28)
RDW: 16.1 % — AB (ref 12.3–15.4)
WBC: 10.7 10*3/uL (ref 3.4–10.8)

## 2016-09-18 LAB — URIC ACID: URIC ACID: 10 mg/dL — AB (ref 2.5–7.1)

## 2016-09-18 LAB — C-REACTIVE PROTEIN: CRP: 4.5 mg/L (ref 0.0–4.9)

## 2016-09-18 LAB — SEDIMENTATION RATE: Sed Rate: 10 mm/hr (ref 0–40)

## 2016-09-18 LAB — CYCLIC CITRUL PEPTIDE ANTIBODY, IGG/IGA: CYCLIC CITRULLIN PEPTIDE AB: 10 U (ref 0–19)

## 2016-09-18 LAB — RHEUMATOID FACTOR: Rhuematoid fact SerPl-aCnc: 55.4 IU/mL — ABNORMAL HIGH (ref 0.0–13.9)

## 2016-09-18 LAB — ANA: ANA: NEGATIVE

## 2016-09-18 LAB — HEMOGLOBIN A1C
Est. average glucose Bld gHb Est-mCnc: 126 mg/dL
Hgb A1c MFr Bld: 6 % — ABNORMAL HIGH (ref 4.8–5.6)

## 2016-09-19 ENCOUNTER — Telehealth: Payer: Self-pay | Admitting: Family Medicine

## 2016-09-19 ENCOUNTER — Other Ambulatory Visit: Payer: Self-pay | Admitting: *Deleted

## 2016-09-19 ENCOUNTER — Encounter: Payer: Self-pay | Admitting: Family Medicine

## 2016-09-19 MED ORDER — DICLOFENAC SODIUM 75 MG PO TBEC: 75.0000 mg | DELAYED_RELEASE_TABLET | Freq: Two times a day (BID) | ORAL | 0 refills | Status: DC

## 2016-09-19 NOTE — Telephone Encounter (Signed)
I called the patient and she states her foot is better,and the swelling in her foot and hand are better. She is still unable to bend her finger. She would like to know if you could call in something for this.

## 2016-09-19 NOTE — Telephone Encounter (Signed)
Patient seen Dr. Lorin Picket on 09/16/16.  She said her foot is better, inflammation in hand is better, but she still cant move her finger.  Please advise.  Also, will need work excuse 8/15-8/17

## 2016-09-19 NOTE — Telephone Encounter (Signed)
Diclofenac 75 mg 1 twice a day when necessary do not take Aleve or Advil with this, #40

## 2016-09-19 NOTE — Telephone Encounter (Signed)
Med sent to pharm. Pt notified.  

## 2016-09-19 NOTE — Telephone Encounter (Signed)
Work excuse complete. °

## 2016-09-19 NOTE — Telephone Encounter (Signed)
Please go ahead and give work excuse 

## 2016-09-19 NOTE — Telephone Encounter (Signed)
Also, will need work excuse 8/15-8/17

## 2016-09-22 ENCOUNTER — Encounter: Payer: Self-pay | Admitting: Family Medicine

## 2016-09-24 ENCOUNTER — Telehealth: Payer: Self-pay | Admitting: Family Medicine

## 2016-09-24 ENCOUNTER — Encounter: Payer: Self-pay | Admitting: Family Medicine

## 2016-09-24 MED ORDER — ALLOPURINOL 100 MG PO TABS
100.0000 mg | ORAL_TABLET | Freq: Every day | ORAL | 1 refills | Status: DC
Start: 2016-09-24 — End: 2017-03-09

## 2016-09-24 MED ORDER — COLCHICINE 0.6 MG PO TABS: 0.6000 mg | ORAL_TABLET | Freq: Every day | ORAL | 0 refills | Status: DC

## 2016-09-24 MED FILL — ALLOPURINOL 100 MG TABLET: 100 | 90 days supply | Qty: 90 | Fill #0

## 2016-09-24 MED FILL — COLCHICINE 0.6 MG TABLET: 0.6 | 90 days supply | Qty: 90 | Fill #0

## 2016-09-24 NOTE — Telephone Encounter (Signed)
Spoke with patient and informed her per Dr.Scott Luking- letter has been dictated. Patient verbalized understanding.

## 2016-09-24 NOTE — Telephone Encounter (Signed)
Patient called stating that she is having pain in her legs that is affecting her at work. Patient states they are not allowed to sit down but was told that if she has a note from her primary care facility stating that she may sit down on non scheduled breaks periodically due to leg pain. Please advise?

## 2016-09-24 NOTE — Addendum Note (Signed)
Addended by: Jeralene Peters on: 09/24/2016 08:37 AM   Modules accepted: Orders

## 2016-09-24 NOTE — Telephone Encounter (Signed)
A letter regarding this was dictated and available via [employer] this for the patient

## 2016-10-02 ENCOUNTER — Other Ambulatory Visit: Payer: Self-pay | Admitting: Family Medicine

## 2016-10-02 MED ORDER — ALPRAZOLAM 1 MG PO TABS: 1.0000 mg | ORAL_TABLET | Freq: Three times a day (TID) | ORAL | 2 refills | Status: DC | PRN

## 2016-10-02 NOTE — Telephone Encounter (Signed)
Pt is requesting refills on herALPRAZolam (XANAX) 1 MG tablet  Pt will need 90 day supply. Pt only has two pills left and doesn't want to go the weekend without any.   [facility] [place]

## 2016-10-02 NOTE — Telephone Encounter (Signed)
Prescription faxed to pharmacy. Patient notified. 

## 2016-10-02 NOTE — Telephone Encounter (Signed)
May have a one month supply with 2 refills

## 2016-10-02 NOTE — Telephone Encounter (Signed)
Spoke with autumn about

## 2016-10-02 NOTE — Telephone Encounter (Signed)
patient last prescription was for #30 tablets

## 2016-10-03 MED FILL — POTASSIUM CL ER 20 MEQ TABL: 20 | 90 days supply | Qty: 180 | Fill #1

## 2016-10-08 ENCOUNTER — Other Ambulatory Visit: Payer: Self-pay | Admitting: Family Medicine

## 2016-10-08 MED FILL — METOPROLOL SUCC ER 50 MG TA: 50 | 90 days supply | Qty: 90 | Fill #1

## 2016-10-08 MED FILL — LISINOPRIL 2.5 MG TABLET: 2.5 | 90 days supply | Qty: 90 | Fill #2

## 2016-10-09 NOTE — Telephone Encounter (Signed)
May have this plus four additional refills 

## 2016-10-14 ENCOUNTER — Telehealth: Payer: Self-pay | Admitting: *Deleted

## 2016-10-14 MED ORDER — DICLOFENAC SODIUM 75 MG PO TBEC: 75.0000 mg | DELAYED_RELEASE_TABLET | Freq: Two times a day (BID) | ORAL | 2 refills | Status: DC

## 2016-10-14 MED FILL — DICLOFENAC SOD 75 MG TAB EC: 75 | 20 days supply | Qty: 40 | Fill #0

## 2016-10-14 NOTE — Telephone Encounter (Signed)
Left message return call 10/14/16 

## 2016-10-14 NOTE — Telephone Encounter (Signed)
She may have refill take twice daily 2 additional refills

## 2016-10-14 NOTE — Telephone Encounter (Signed)
Seen in august for pain in arms and legs. Pt states she is out of diclofenac. Would like a refill [facility]. Still waiting on appt with rheumatology. Med help when she was taking it. She is completely out of it.  Please call pt after med sent [phone]

## 2016-10-30 MED FILL — DICLOFENAC SOD 75 MG TAB EC: 75 | 20 days supply | Qty: 40 | Fill #1

## 2016-11-14 ENCOUNTER — Encounter (HOSPITAL_COMMUNITY): Payer: Self-pay | Admitting: Emergency Medicine

## 2016-11-14 ENCOUNTER — Emergency Department (HOSPITAL_COMMUNITY)
Admission: EM | Admit: 2016-11-14 | Discharge: 2016-11-14 | Disposition: A | Discharge status: Home / Self Care | Payer: PRIVATE HEALTH INSURANCE | Attending: Emergency Medicine | Admitting: Emergency Medicine

## 2016-11-14 DIAGNOSIS — Z87891 Personal history of nicotine dependence: Secondary | ICD-10-CM | POA: Diagnosis not present

## 2016-11-14 DIAGNOSIS — X101XXA Contact with hot food, initial encounter: Secondary | ICD-10-CM | POA: Diagnosis not present

## 2016-11-14 DIAGNOSIS — E119 Type 2 diabetes mellitus without complications: Secondary | ICD-10-CM | POA: Diagnosis not present

## 2016-11-14 DIAGNOSIS — S59911A Unspecified injury of right forearm, initial encounter: Secondary | ICD-10-CM | POA: Diagnosis present

## 2016-11-14 DIAGNOSIS — Z79899 Other long term (current) drug therapy: Secondary | ICD-10-CM | POA: Insufficient documentation

## 2016-11-14 DIAGNOSIS — Y92233 Cafeteria of hospital as the place of occurrence of the external cause: Secondary | ICD-10-CM | POA: Insufficient documentation

## 2016-11-14 DIAGNOSIS — Y93G3 Activity, cooking and baking: Secondary | ICD-10-CM | POA: Insufficient documentation

## 2016-11-14 DIAGNOSIS — Y990 Civilian activity done for income or pay: Secondary | ICD-10-CM | POA: Insufficient documentation

## 2016-11-14 DIAGNOSIS — T22011A Burn of unspecified degree of right forearm, initial encounter: Secondary | ICD-10-CM | POA: Insufficient documentation

## 2016-11-14 DIAGNOSIS — Z7984 Long term (current) use of oral hypoglycemic drugs: Secondary | ICD-10-CM | POA: Insufficient documentation

## 2016-11-14 DIAGNOSIS — E039 Hypothyroidism, unspecified: Secondary | ICD-10-CM | POA: Diagnosis not present

## 2016-11-14 DIAGNOSIS — Z7902 Long term (current) use of antithrombotics/antiplatelets: Secondary | ICD-10-CM | POA: Diagnosis not present

## 2016-11-14 DIAGNOSIS — I10 Essential (primary) hypertension: Secondary | ICD-10-CM | POA: Insufficient documentation

## 2016-11-14 DIAGNOSIS — T22211A Burn of second degree of right forearm, initial encounter: Secondary | ICD-10-CM

## 2016-11-14 MED ORDER — SILVER SULFADIAZINE 1 % EX CREA
TOPICAL_CREAM | Freq: Once | CUTANEOUS | Status: AC
  Administered 2016-11-14: 1 via TOPICAL
  Filled 2016-11-14: qty 50

## 2016-11-14 MED ORDER — TETANUS-DIPHTH-ACELL PERTUSSIS 5-2.5-18.5 LF-MCG/0.5 IM SUSP
0.5000 mL | Freq: Once | INTRAMUSCULAR | Status: AC
  Administered 2016-11-14: 0.5 mL via INTRAMUSCULAR
  Filled 2016-11-14: qty 0.5

## 2016-11-14 MED ORDER — HYDROCODONE-ACETAMINOPHEN 5-325 MG PO TABS
1.0000 | ORAL_TABLET | Freq: Once | ORAL | Status: AC
  Administered 2016-11-14: 1 via ORAL
  Filled 2016-11-14: qty 1

## 2016-11-14 NOTE — Discharge Instructions (Signed)
Wash off and re-apply the silvadene twice a day and keep the area bandaged.  Return here on Sunday for recheck.

## 2016-11-14 NOTE — ED Triage Notes (Signed)
Burn to right anterior forearm.

## 2016-11-14 NOTE — ED Provider Notes (Signed)
[facility] DEPT Provider Note   CSN: [id number] Arrival date & time: 11/14/16  1403     History   Chief Complaint Chief Complaint  Patient presents with  . Burn    HPI Amy Hunter is a 56 y.o. female.  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 presents to the Emergency Department complaining of burn to her right forearm. She states that she was removing a hot tray of peach cobbler when it splashed out onto her forearm. She reports immediate pain and blistering to the mid forearm. Pain is associated with palpation. She's not tried any therapies prior to arrival.Last tetanus is unknown. She denies numbness, swelling, and difficulty with finger movement.  This is a work-related injury  Past Medical History:  Diagnosis Date  . Allergy   . Diabetes mellitus   . Hypertension   . Sleep apnea     Patient Active Problem List   Diagnosis Date Noted  . Type 2 diabetes mellitus without complication (HCC) 08/17/2015  . Hyperlipidemia 11/10/2012  . Hypothyroidism 11/10/2012  . Obstructive sleep apnea 05/03/2012  . Dyspnea on exertion 03/30/2012  . Morbid obesity (HCC) 03/30/2012  . Diabetes mellitus type 2, controlled, with complications (HCC) 03/30/2012  . Varicose veins of lower extremities with other complications 12/23/2011  . Venous insufficiency 09/16/2011  . TRIGGER FINGER 12/11/2008    Past Surgical History:  Procedure Laterality Date  . CHOLECYSTECTOMY    . VARICOSE VEIN SURGERY Bilateral    laser treatment--ligation    OB History    Gravida Para Term Preterm AB Living   SAB TAB Ectopic Multiple Live Births                   Home Medications    Prior to Admission medications   Medication Sig Start Date End Date Taking? Authorizing Provider  albuterol (PROVENTIL HFA;VENTOLIN HFA) 108 (90 BASE) MCG/ACT inhaler Inhale 2 puffs into the lungs every 6 (six) hours as needed for wheezing.  12/11/14   Babs Sciara, MD  allopurinol (ZYLOPRIM) 100 MG tablet Take 1 tablet (100 mg total) by mouth daily. 09/24/16   Babs Sciara, MD  ALPRAZolam (XANAX) 1 MG tablet TAKE 1 TABLET BY MOUTH 3 TIMES PER DAY AS NEEDED FOR ANXIETY OR SLEEP 10/09/16   Babs Sciara, MD  colchicine 0.6 MG tablet Take 1 tablet (0.6 mg total) by mouth daily. 09/24/16   Babs Sciara, MD  Cyanocobalamin (VITAMIN B-12) 2000 MCG TBCR Take 1 tablet by mouth daily.    [provider]  diclofenac (VOLTAREN) 75 MG EC tablet Take 1 tablet (75 mg total) by mouth 2 (two) times daily. 10/14/16   Babs Sciara, MD  doxycycline (VIBRA-TABS) 100 MG tablet Take 1 tablet (100 mg total) by mouth 2 (two) times daily. 07/09/16   Merlyn Albert, MD  HYDROcodone-acetaminophen (NORCO) 10-325 MG tablet Take 1 tablet by mouth every 4 (four) hours as needed. 09/16/16   Babs Sciara, MD  levothyroxine (SYNTHROID, LEVOTHROID) 200 MCG tablet TAKE 1 TABLET BY MOUTH DAILY AND 1 & 1/2 TABLETS BY MOUTH EVERY MONDAY 04/02/16   Babs Sciara, MD  lisinopril (PRINIVIL,ZESTRIL) 2.5 MG tablet Take 1 tablet (2.5 mg total) by mouth every morning. 04/02/16   Babs Sciara, MD  metoprolol succinate (TOPROL-XL) 50 MG 24 hr tablet TAKE 1 TABLET BY  MOUTH DAILY WITH OR IMMEDIATELY FOLLOWING A MEAL. 04/02/16   Babs Sciara, MD  mometasone (ELOCON) 0.1 % cream APPLY TOPICALLY 2 TIMES DAILY AS NEEDED 05/12/16   Babs Sciara, MD  naproxen (NAPROSYN) 500 MG tablet TAKE 1 TABLET BY MOUTH 2 TIMES DAILY WITH A MEAL. 06/25/16   Babs Sciara, MD  ondansetron (ZOFRAN ODT) 8 MG disintegrating tablet Take 1 tablet (8 mg total) by mouth every 8 (eight) hours as needed for nausea or vomiting. 03/21/15   Babs Sciara, MD  potassium chloride SA (KLOR-CON M20) 20 MEQ tablet Take 1 tablet (20 mEq total) by mouth 2 (two) times daily. 04/02/16   Babs Sciara, MD  pravastatin (PRAVACHOL) 20 MG tablet Take 1 tablet (20 mg total) by mouth every evening.  04/02/16   Babs Sciara, MD  predniSONE (DELTASONE) 20 MG tablet Three po Qd for two days,Then Two po QD for two days and then  one po QD for Two days 09/16/16   Babs Sciara, MD  torsemide (DEMADEX) 20 MG tablet TAKE 3 TABLETS BY MOUTH EVERY MORNING AND 3 TABLETS AT NOON 04/02/16   Babs Sciara, MD  UNIFINE PENTIPS 31G X 8 MM MISC USE AS DIRECTED DAILY WITH VICTOZA 05/22/15   Babs Sciara, MD  VICTOZA 18 MG/3ML SOPN INJECT 1.8MG  UNDER THE SKIN DAILY 01/07/16   Babs Sciara, MD    Family History Family History  Problem Relation Age of Onset  . Diabetes Mother   . Hypertension Mother   . Heart disease Mother   . Breast cancer Mother   . Lung cancer Mother   . Heart disease Brother   . Heart disease Father   . Lung cancer Father   . Brain cancer Father   . Asthma Father   . Lung cancer Maternal Grandmother   . Colon cancer Paternal Grandmother     Social History Social History  Substance Use Topics  . Smoking status: Former Smoker    Packs/day: 0.50    Years: 15.00    Types: Cigarettes    Quit date: 02/03/2010  . Smokeless tobacco: Never Used     Comment: pt reports she smoked "off and on" for the entire smoking years.   . Alcohol use No     Allergies   Benadryl [diphenhydramine hcl] and Indocin [indomethacin]   Review of Systems Review of Systems  Constitutional: Negative for chills and fever.  Gastrointestinal: Negative for nausea and vomiting.  Skin: Positive for color change and wound. Negative for pallor.       Burn to right mid forearm  Neurological: Negative for weakness and numbness.  Hematological: Does not bruise/bleed easily.     Physical Exam Updated Vital Signs BP 115/65   Pulse (!) 110   Temp 98 F (36.7 C)   Resp 18   Ht  (1.702 m)   Wt (!) 147.4 kg (325 lb)   SpO2 96%   BMI 50.90 kg/m   Physical Exam  Constitutional: She is oriented to person, place, and time. She appears well-developed and well-nourished. No distress.    HENT:  Head: Atraumatic.  Cardiovascular: Normal rate, regular rhythm and intact distal pulses.   Pulmonary/Chest: Effort normal and breath sounds normal. No respiratory distress.  Musculoskeletal: Normal range of motion. She exhibits no edema.  Pt has full ROM of the right hand, elbow and fingers.    Neurological: She is alert and oriented to person, place, and time.  No sensory deficit.  Skin: Skin is warm. Capillary refill takes less than 2 seconds.  7 cm circular, second degree burn to mid right forearm.  Sloughing of the outer most epidermal layers of skin.  No edema or pallor  Psychiatric: She has a normal mood and affect.  Nursing note and vitals reviewed.    ED Treatments / Results  Labs (all labs ordered are listed, but only abnormal results are displayed) Labs Reviewed - No data to display  EKG  EKG Interpretation None       Radiology No results found.  Procedures Procedures (including critical care time)  Medications Ordered in ED Medications  Tdap (BOOSTRIX) injection 0.5 mL (0.5 mLs Intramuscular Given 11/14/16 1539)  silver sulfADIAZINE (SILVADENE) 1 % cream (1 application Topical Given 11/14/16 1540)  HYDROcodone-acetaminophen (NORCO/VICODIN) 5-325 MG per tablet 1 tablet (1 tablet Oral Given 11/14/16 1540)     Initial Impression / Assessment and Plan / ED Course  I have reviewed the triage vital signs and the nursing notes.  Pertinent labs & imaging results that were available during my care of the patient were reviewed by me and considered in my medical decision making (see chart for details).     Td updated Burn cleaned, dressed with silvadene cream.  Remains NV intact.  Pt agrees to recheck here in 2 days.    Final Clinical Impressions(s) / ED Diagnoses   Final diagnoses:  Partial thickness burn of right forearm, initial encounter    New Prescriptions New Prescriptions   No medications on file     Pauline Aus, Cordelia Poche 11/14/16  1601    Loren Racer, MD 11/18/16 [phone]

## 2016-11-24 ENCOUNTER — Other Ambulatory Visit: Payer: Self-pay | Admitting: Family Medicine

## 2016-11-25 MED FILL — PRAVASTATIN SODIUM 20 MG TA: 20 | 90 days supply | Qty: 90 | Fill #0

## 2016-11-25 MED FILL — TORSEMIDE 20 MG TABLET: 20 | 90 days supply | Qty: 540 | Fill #0

## 2016-12-08 MED FILL — DICLOFENAC SOD 75 MG TAB EC: 75 | 20 days supply | Qty: 40 | Fill #2

## 2016-12-08 MED FILL — LEVOTHYROXINE 200 MCG TAB: 200 | 90 days supply | Qty: 108 | Fill #2

## 2016-12-18 ENCOUNTER — Other Ambulatory Visit: Payer: Self-pay | Admitting: Family Medicine

## 2016-12-18 MED FILL — COLCHICINE 0.6 MG TABS: 0.6 | 90 days supply | Qty: 90 | Fill #0

## 2016-12-19 ENCOUNTER — Telehealth: Payer: Self-pay | Admitting: Family Medicine

## 2016-12-19 DIAGNOSIS — R5383 Other fatigue: Secondary | ICD-10-CM | POA: Diagnosis not present

## 2016-12-19 DIAGNOSIS — L409 Psoriasis, unspecified: Secondary | ICD-10-CM | POA: Diagnosis not present

## 2016-12-19 DIAGNOSIS — R768 Other specified abnormal immunological findings in serum: Secondary | ICD-10-CM | POA: Diagnosis not present

## 2016-12-19 DIAGNOSIS — M109 Gout, unspecified: Secondary | ICD-10-CM | POA: Diagnosis not present

## 2016-12-19 DIAGNOSIS — Z6841 Body Mass Index (BMI) 40.0 and over, adult: Secondary | ICD-10-CM | POA: Diagnosis not present

## 2016-12-19 NOTE — Telephone Encounter (Signed)
[employer] faxed over form to be done. I filled out what I could please review and date,sign.in yellow folder.

## 2016-12-22 MED FILL — ALLOPURINOL 100 MG TABLET: 100 | 90 days supply | Qty: 90 | Fill #1

## 2016-12-29 NOTE — Telephone Encounter (Signed)
The form was filled in, I am uncertain if this form is also in reference to her being out when her husband is sick Amy Hunter(Jimmy Zoll) if so I will need to add additional information

## 2017-01-01 ENCOUNTER — Other Ambulatory Visit: Payer: Self-pay | Admitting: Family Medicine

## 2017-01-01 MED FILL — DICLOFENAC SODIUM 75 MG TAB: 75 | 20 days supply | Qty: 40 | Fill #0

## 2017-01-13 ENCOUNTER — Other Ambulatory Visit: Payer: Self-pay | Admitting: Family Medicine

## 2017-01-13 MED FILL — LISINOPRIL 2.5 MG TABLET: 2.5 | 90 days supply | Qty: 90 | Fill #3

## 2017-01-13 MED FILL — METOPROLOL SUCC ER 50 MG TA: 50 | 90 days supply | Qty: 90 | Fill #0

## 2017-02-05 ENCOUNTER — Telehealth: Payer: Self-pay | Admitting: Family Medicine

## 2017-02-05 NOTE — Telephone Encounter (Signed)
Please advise 

## 2017-02-05 NOTE — Telephone Encounter (Signed)
Fax from [facility]. victoza was sent in today and pharm states this med requires step therapy. A trial of metformin or trulicity is required. Please advise.

## 2017-02-05 NOTE — Telephone Encounter (Signed)
Patient states she will try trulicity. Please advise.

## 2017-02-05 NOTE — Telephone Encounter (Signed)
Please inform the patient-because of medication pharmacy insurance company guidelines she will need to try either Metformin or Trulicity-Trulicity is very similar to Victoza- Trulicity is reimbursed by her insurance where is big toes is not-Trulicity it is a once a week injectable medicine-please see with the patient is interested in in order to help guide us on to what to prescribe

## 2017-02-06 ENCOUNTER — Telehealth: Payer: Self-pay

## 2017-02-06 MED ORDER — DULAGLUTIDE 1.5 MG/0.5ML ~~LOC~~ SOAJ: SUBCUTANEOUS | 1 refills | Status: DC

## 2017-02-06 NOTE — Telephone Encounter (Signed)
Patient called today stating we sent In the wrong medication. She was willing to try the Trulicity,but the victoza was sent in instead by mistake. Please advise.Can I send in new rx for the Trulicity.([facility])

## 2017-02-06 NOTE — Telephone Encounter (Signed)
First I have no idea regarding the Victoza and how that got sent in.  #2 I would recommend Trulicity 1.5 mg once weekly subcutaneous may have 90-day with refill (typically it is 4/month) #3 cancel Victoza from medication list

## 2017-02-06 NOTE — Telephone Encounter (Signed)
Patient is aware 

## 2017-02-08 NOTE — Telephone Encounter (Signed)
Trulicity was sent into the hospital pharmacy we will see how well she tolerates hopefully well

## 2017-02-09 ENCOUNTER — Telehealth: Payer: Self-pay | Admitting: Family Medicine

## 2017-02-09 MED ORDER — ALPRAZOLAM 1 MG PO TABS: ORAL_TABLET | ORAL | 1 refills | Status: DC

## 2017-02-09 MED ORDER — GLIPIZIDE 5 MG PO TABS: ORAL_TABLET | ORAL | 2 refills | Status: DC

## 2017-02-09 MED FILL — ALPRAZolam 1 MG TABS: 1 | 10 days supply | Qty: 30 | Fill #0

## 2017-02-09 NOTE — Telephone Encounter (Signed)
Prescription faxed to pharmacy. Patient notified. 

## 2017-02-09 NOTE — Telephone Encounter (Signed)
May have this refill +1 additional needs to do follow-up

## 2017-02-09 NOTE — Telephone Encounter (Signed)
Pt called stating that she is unable to get the trulicity due to her not meeting the requirements for it and the victoza is too much. Pt is out of her medication and is wanting to go back on the glipizide that she was on previously till she can come back to talk about something else. Please advise.   [facility] [place]

## 2017-02-09 NOTE — Telephone Encounter (Signed)
Last seen 09/16/16 for diabetes

## 2017-02-09 NOTE — Telephone Encounter (Signed)
Glipizide 5 mg tablet 1 in the morning one half at supper, 30-day supply, 3 refills, follow glucoses at least once per day if any low sugar spells may need to reduce the medication, schedule follow-up visit to discuss further

## 2017-02-09 NOTE — Telephone Encounter (Signed)
Pt Is also needing refills on ALPRAZolam (XANAX) 1 MG tablet  [facility]

## 2017-02-09 NOTE — Telephone Encounter (Signed)
Prescription sent electronically to pharmacy. Patient notified. 

## 2017-02-09 NOTE — Telephone Encounter (Signed)
Last seen for diabetes 09/16/16

## 2017-02-10 ENCOUNTER — Telehealth: Payer: Self-pay | Admitting: Family Medicine

## 2017-02-10 MED FILL — TRULICITY 1.5 MG/0.5 ML PEN: 1.5 | 28 days supply | Qty: 2 | Fill #0

## 2017-02-10 NOTE — Telephone Encounter (Signed)
Rx prior auth APPROVED for pt's Dulaglutide (TRULICITY) 1.5 MG/0.5ML SOPN  Valid 02/09/17-02/08/18 for 12 fills through [employer] reference # [id number]  Faxed approval to [facility], sent to be scanned & filed

## 2017-03-02 MED FILL — PRAVASTATIN SODIUM 20 MG TA: 20 | 90 days supply | Qty: 90 | Fill #1

## 2017-03-02 MED FILL — TORSEMIDE 20 MG TABLET: 20 | 90 days supply | Qty: 540 | Fill #1

## 2017-03-02 MED FILL — COLCHICINE 0.6 MG TABS: 0.6 | 90 days supply | Qty: 90 | Fill #1

## 2017-03-09 ENCOUNTER — Other Ambulatory Visit: Payer: Self-pay | Admitting: Family Medicine

## 2017-03-09 ENCOUNTER — Telehealth: Payer: Self-pay | Admitting: Family Medicine

## 2017-03-09 MED ORDER — ONDANSETRON HCL 8 MG PO TABS: ORAL_TABLET | ORAL | 1 refills | Status: DC

## 2017-03-09 MED FILL — ALLOPURINOL 100 MG TABLET: 100 | 90 days supply | Qty: 90 | Fill #0

## 2017-03-09 NOTE — Telephone Encounter (Signed)
Zofran 8 mg, 1 3 times daily as needed nausea, #12, 1 refill, follow-up if progressive troubles or if worse, clear liquids bland diet, if severe pain vomiting blood or rectal bleeding go to ER

## 2017-03-09 NOTE — Telephone Encounter (Signed)
Patient has come down with the stomach bug.  Having nausea, and diarrhea.  It has hit everyone in the household.  Dois DavenportSandra wants to know if Rx for Zofran can be called in.   [facility] [place]

## 2017-03-09 NOTE — Telephone Encounter (Signed)
Pt is aware of all and [facility] [place].

## 2017-03-09 NOTE — Telephone Encounter (Signed)
I called spoke with Dois DavenportSandra she states she started having symptoms yesterday,no vomiting ,no fever.Want Zofran called in if possible.

## 2017-03-12 MED FILL — TRULICITY 1.5 MG/0.5 ML PEN: 1.5 | 28 days supply | Qty: 2 | Fill #1

## 2017-03-23 MED FILL — LEVOTHYROXINE 200 MCG TAB: 200 | 90 days supply | Qty: 108 | Fill #3

## 2017-04-01 ENCOUNTER — Encounter: Payer: Self-pay | Admitting: Family Medicine

## 2017-04-01 ENCOUNTER — Ambulatory Visit: Payer: 59 | Admitting: Family Medicine

## 2017-04-01 VITALS — BP 118/76 | Temp 98.8°F | Ht 67.0 in | Wt 324.0 lb

## 2017-04-01 DIAGNOSIS — N61 Mastitis without abscess: Secondary | ICD-10-CM | POA: Diagnosis not present

## 2017-04-01 MED ORDER — CEPHALEXIN 500 MG PO CAPS: 500.0000 mg | ORAL_CAPSULE | Freq: Four times a day (QID) | ORAL | 0 refills | Status: DC

## 2017-04-01 MED ORDER — NAPROXEN 500 MG PO TABS: ORAL_TABLET | ORAL | 4 refills | Status: DC

## 2017-04-01 MED ORDER — ALPRAZOLAM 1 MG PO TABS: ORAL_TABLET | ORAL | 5 refills | Status: DC

## 2017-04-01 MED FILL — ALPRAZolam 1 MG TABS: 1 | 10 days supply | Qty: 30 | Fill #1

## 2017-04-01 MED FILL — NAPROXEN 500 MG TABLET: 500 | 30 days supply | Qty: 60 | Fill #0

## 2017-04-01 NOTE — Progress Notes (Signed)
   Subjective:    Patient ID: Amy Hunter, female    DOB: 1961-01-04, 57 y.o.   MRN: [id number]  HPIpain in both breast. Left breast is worse. Redness, warm to the touch, pain when touching, some swelling. Tried tylenol. Pt thought it might be related to a fall she had 3 weeks ago. Patient relates intermittent redness more on the left side than the right side some soreness underneath the axilla region on both sides some soreness in the chest wall denies high fever chills sweats denies wheezing difficulty breathing bleeding issues she is behind on mammogram she is under a lot of stress taking care of her husband who is fading away with severe COPD Needs refill on xanax and naproxen. [facility].     Review of Systems Please see above negative for chest tightness pressure angina negative for wheezing difficulty breathing negative for high fever chills sweats negative for arthralgias    Objective:   Physical Exam Neck no masses lungs are clear no crackles or respiratory rate normal heart is regular no murmurs extremities trace edema in the ankles has redness in the left breast but mild tenderness in the chest wall  worse on the left side than the right side some slight tenderness also tenderness underneath the axilla probable cellulitis no masses are felt in the breast       Assessment & Plan:  Cellulitis Treat with antibiotic Anti-inflammatory as needed Recheck in 2-3 weeks to make sure this is gone away completely Will set up mammogram for later this spring Refills of medications given

## 2017-04-01 NOTE — Patient Instructions (Signed)
Please follow-up in 2-3 weeks so we can recheck this area and make sure it is completely normal  We will do a mammogram later this spring

## 2017-04-13 ENCOUNTER — Other Ambulatory Visit: Payer: Self-pay | Admitting: Family Medicine

## 2017-04-13 MED FILL — METOPROLOL SUCCINATE ER 50: 50 | 90 days supply | Qty: 90 | Fill #0

## 2017-04-13 MED FILL — TRULICITY 1.5 MG/0.5 ML PEN: 1.5 | 28 days supply | Qty: 2 | Fill #2

## 2017-04-13 MED FILL — LISINOPRIL 2.5 MG TABLET: 2.5 | 90 days supply | Qty: 90 | Fill #0

## 2017-04-17 ENCOUNTER — Encounter: Payer: Self-pay | Admitting: Family Medicine

## 2017-04-17 ENCOUNTER — Ambulatory Visit (INDEPENDENT_AMBULATORY_CARE_PROVIDER_SITE_OTHER): Payer: 59 | Admitting: Family Medicine

## 2017-04-17 VITALS — BP 138/86 | Ht 67.0 in | Wt 318.6 lb

## 2017-04-17 DIAGNOSIS — E119 Type 2 diabetes mellitus without complications: Secondary | ICD-10-CM | POA: Diagnosis not present

## 2017-04-17 DIAGNOSIS — M109 Gout, unspecified: Secondary | ICD-10-CM

## 2017-04-17 DIAGNOSIS — E7849 Other hyperlipidemia: Secondary | ICD-10-CM

## 2017-04-17 DIAGNOSIS — E039 Hypothyroidism, unspecified: Secondary | ICD-10-CM | POA: Diagnosis not present

## 2017-04-17 NOTE — Progress Notes (Signed)
   Subjective:    Patient ID: Amy Hunter, female    DOB: 07-Jan-1961, 57 y.o.   MRN: [id number]  HPI  Patient arrives for a follow up on recent hospitalization.  Cellulitis breast resolved Both the past has resolved She denies any tenderness pain discomfort Will do a mammogram coming up Patient going through grief because of loss of husband Review of Systems  Constitutional: Negative for activity change, fatigue and fever.  HENT: Negative for congestion.   Respiratory: Negative for cough, chest tightness and shortness of breath.   Cardiovascular: Negative for chest pain and leg swelling.  Gastrointestinal: Negative for abdominal pain.  Skin: Negative for color change.  Neurological: Negative for headaches.  Psychiatric/Behavioral: Negative for behavioral problems.       Objective:   Physical Exam  Constitutional: She appears well-developed and well-nourished. No distress.  HENT:  Head: Normocephalic and atraumatic.  Eyes: Right eye exhibits no discharge. Left eye exhibits no discharge.  Neck: No tracheal deviation present.  Cardiovascular: Normal rate, regular rhythm and normal heart sounds.  No murmur heard. Pulmonary/Chest: Effort normal and breath sounds normal. No respiratory distress. She has no wheezes. She has no rales.  Musculoskeletal: She exhibits no edema.  Lymphadenopathy:    She has no cervical adenopathy.  Neurological: She is alert. She exhibits normal muscle tone.  Skin: Skin is warm and dry. No erythema.  Psychiatric: Her behavior is normal.  Vitals reviewed.  Patient not suicidal       Assessment & Plan:  Cellulitis resolved Grief-normal patient not depressed currently having normal symptoms recommend counseling if ongoing follow-up again in a couple months time with lab work and wellness checkup

## 2017-05-06 MED FILL — NAPROXEN 500 MG TABLET: 500 | 30 days supply | Qty: 60 | Fill #1

## 2017-05-07 MED FILL — TRULICITY 1.5 MG/0.5 ML PEN: 1.5 | 28 days supply | Qty: 2 | Fill #3

## 2017-05-11 IMAGING — CT CT HEAD W/O CM
2 series · 16 of 30 positions shown, 20 images · non-contrast
Comparison: None.

CLINICAL DATA: Dizziness with nausea.  Photophobia.

EXAM:
CT HEAD WITHOUT CONTRAST
TECHNIQUE: Contiguous axial images were obtained from the base of the skull
through the vertex without intravenous contrast.

[Series 2: head w/o · axial · non-contrast · 0.42mm/px · z∈[+82,+212]mm · 13 of 32 slices shown, 17 images]
[im 3/32  brain]
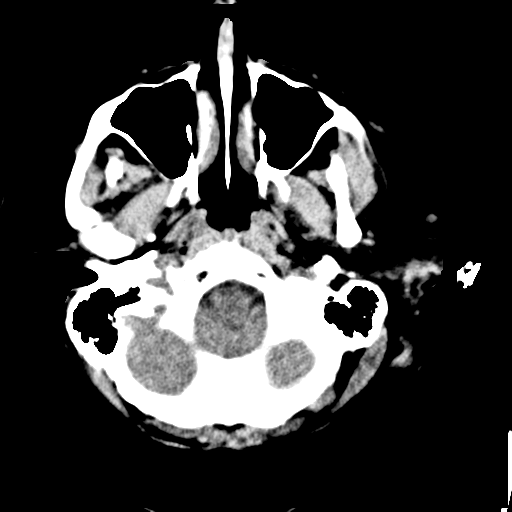
[im 3/32  bone]
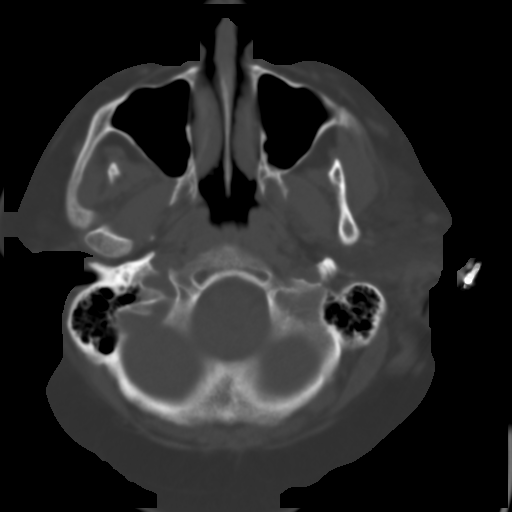
[im 5/32  brain]
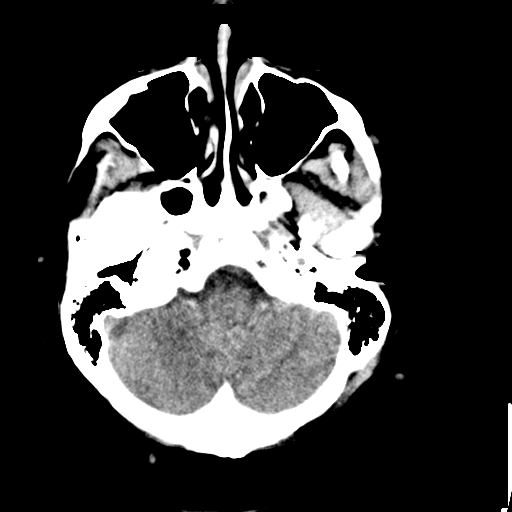
[im 7/32  brain]
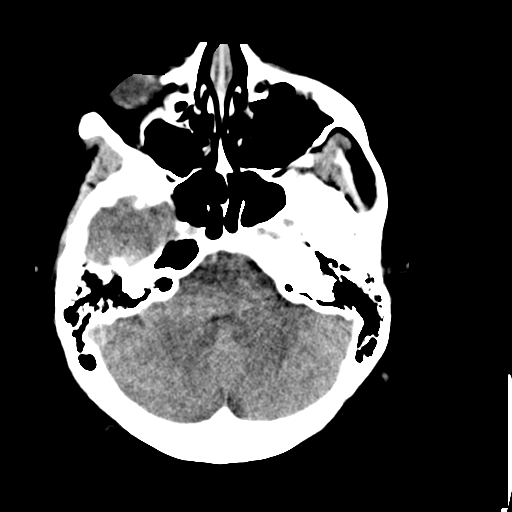
[im 9/32  brain]
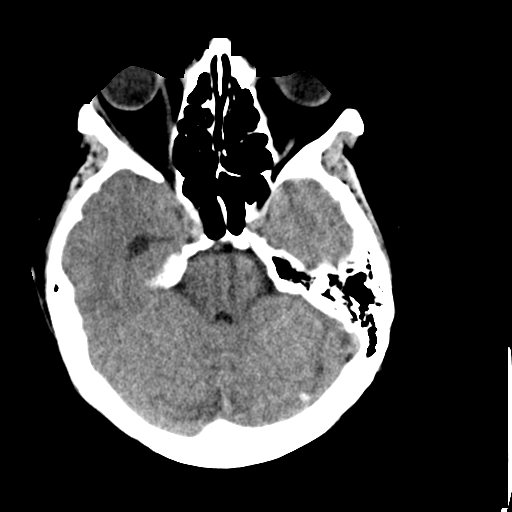
[im 12/32  brain]
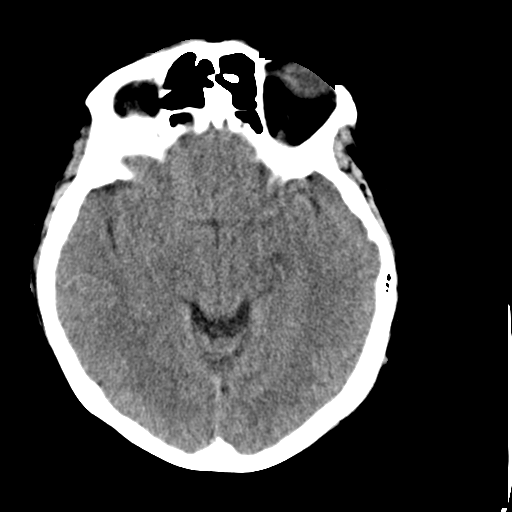
[im 12/32  bone]
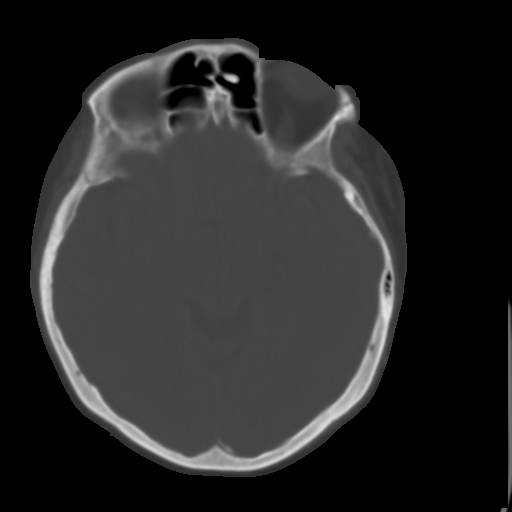
[im 14/32  brain]
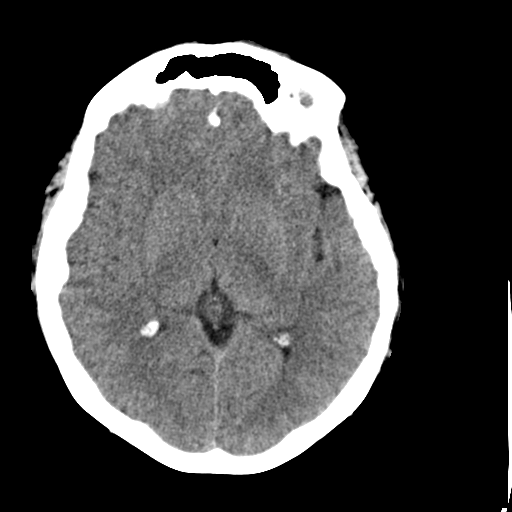
[im 16/32  brain]
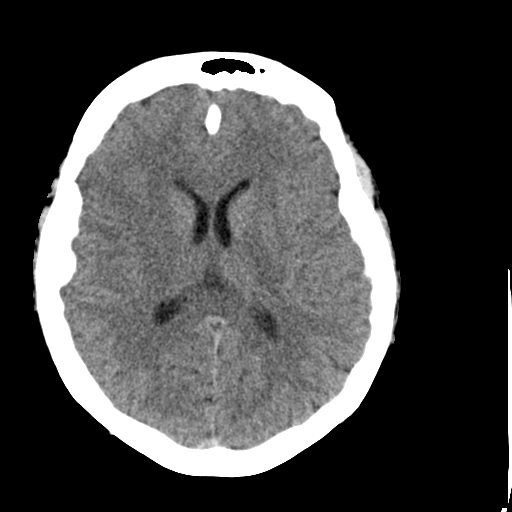
[im 18/32  brain]
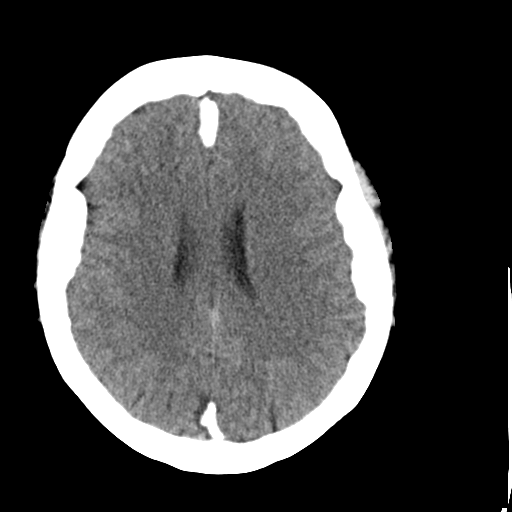
[im 20/32  brain]
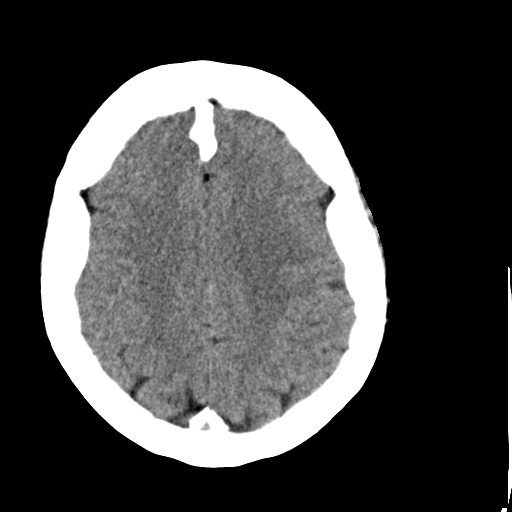
[im 20/32  bone]
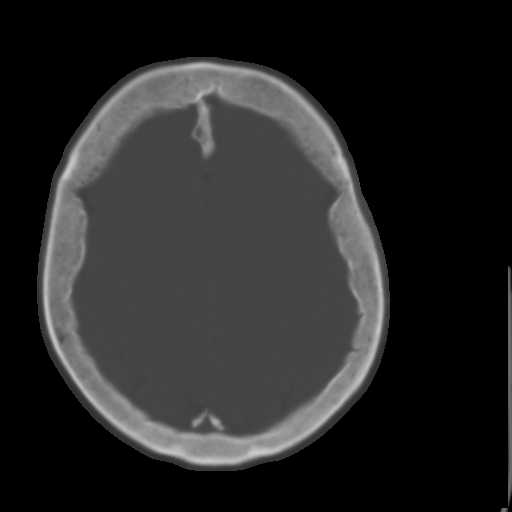
[im 23/32  brain]
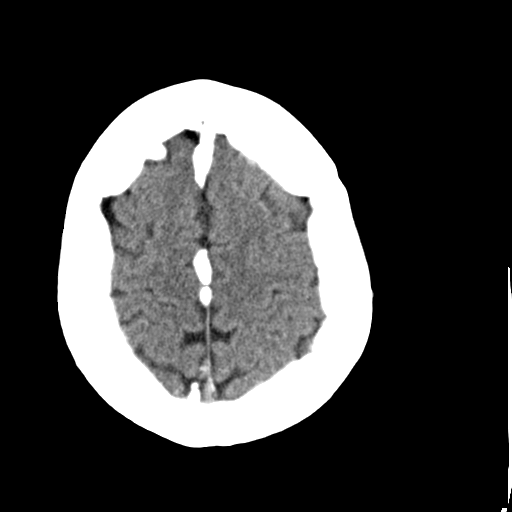
[im 25/32  brain]
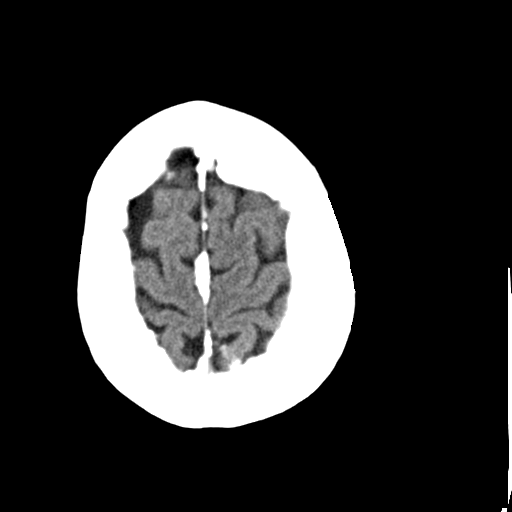
[im 27/32  brain]
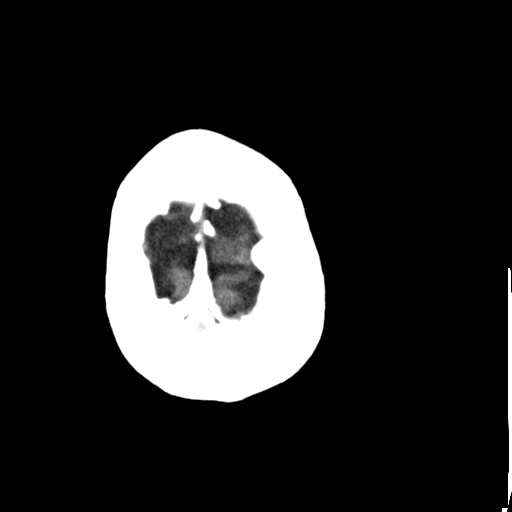
[im 29/32  brain]
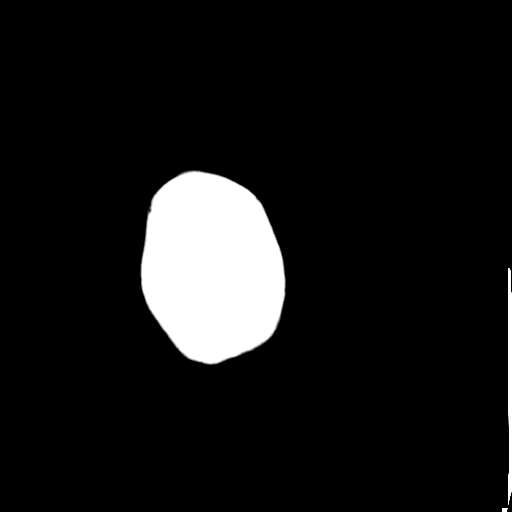
[im 29/32  bone]
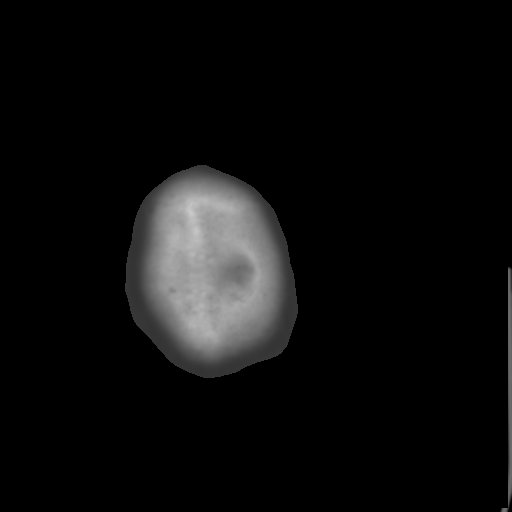

[Series 3: head bone · axial · 0.42mm/px · z∈[+82,+127]mm · 3 of 32 slices shown]
[im 3/32  bone]
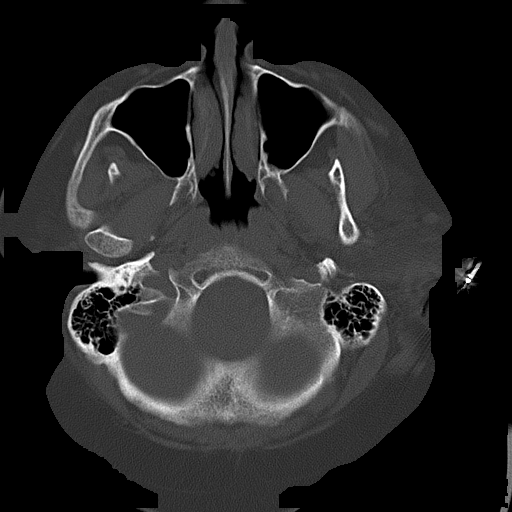
[im 7/32  bone]
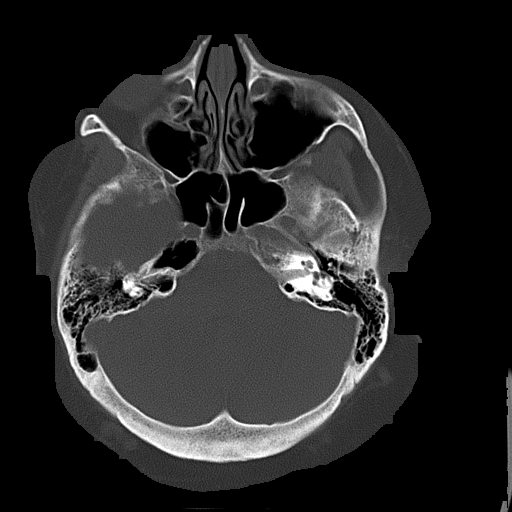
[im 12/32  bone]
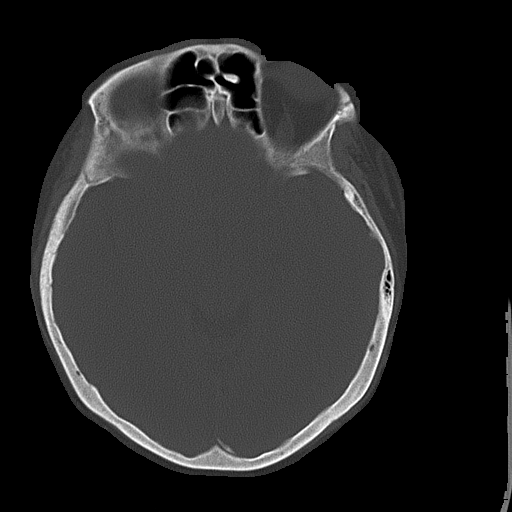

[16 of 30 positions shown; findings below may reference images not displayed]

FINDINGS: Paranasal sinuses, mastoid air cells, bones, and extracranial soft
tissues are within normal limits. No subdural, epidural, or
subarachnoid hemorrhage. The cerebellum, brainstem, and basal
cisterns are normal. The ventricles are unremarkable other than the
temporal horn on the right which is mildly prominent but of doubtful
acute significance. No mass, mass effect, or midline shift. No acute
cortical ischemia or infarct.
IMPRESSION: No acute abnormalities.

## 2017-05-11 IMAGING — DX DG CHEST 2V
2 series · 2 of 2 positions shown · non-contrast
Comparison: 05/03/2012

CLINICAL DATA: Dizziness

EXAM:
CHEST  2 VIEW

[chest lat]
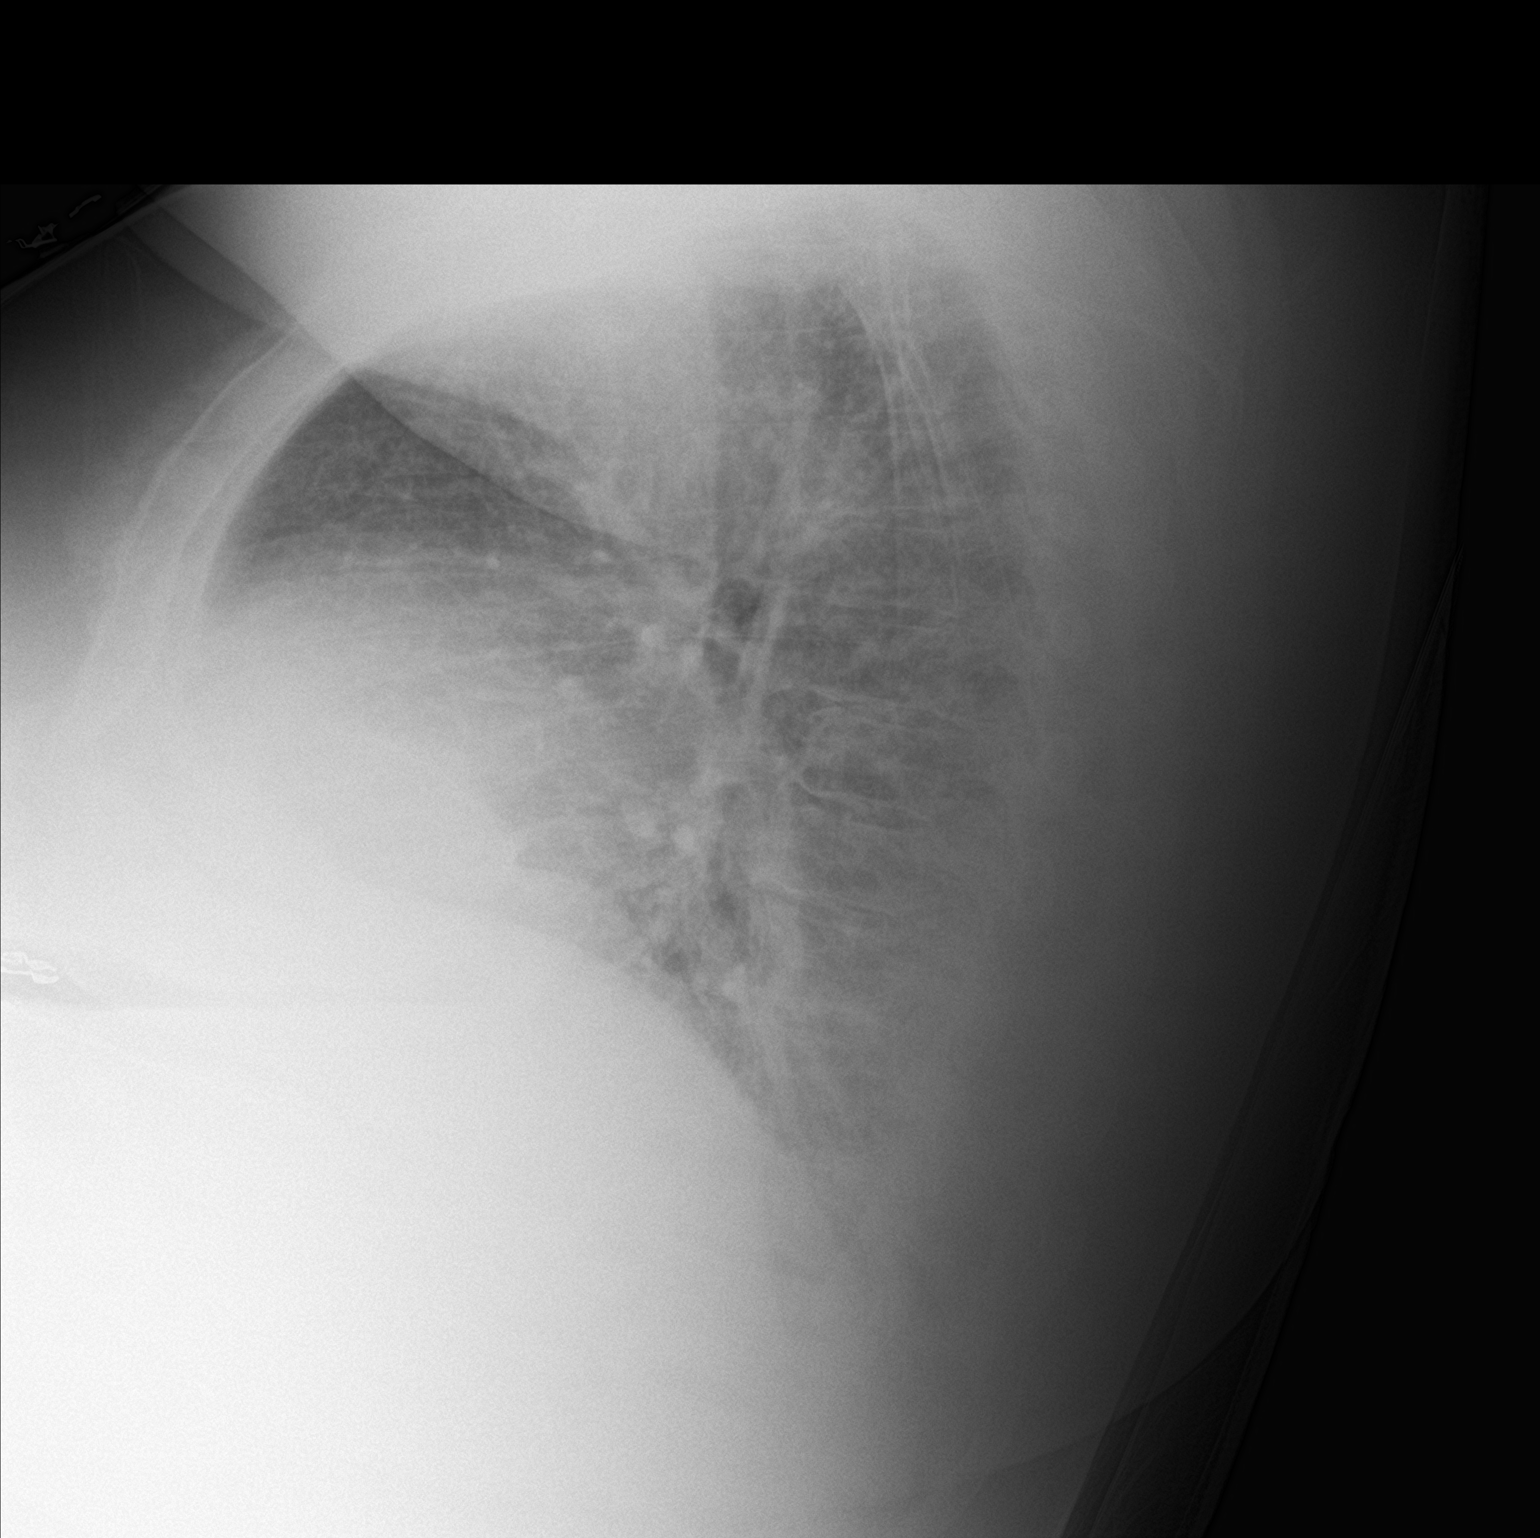

[chest ap]
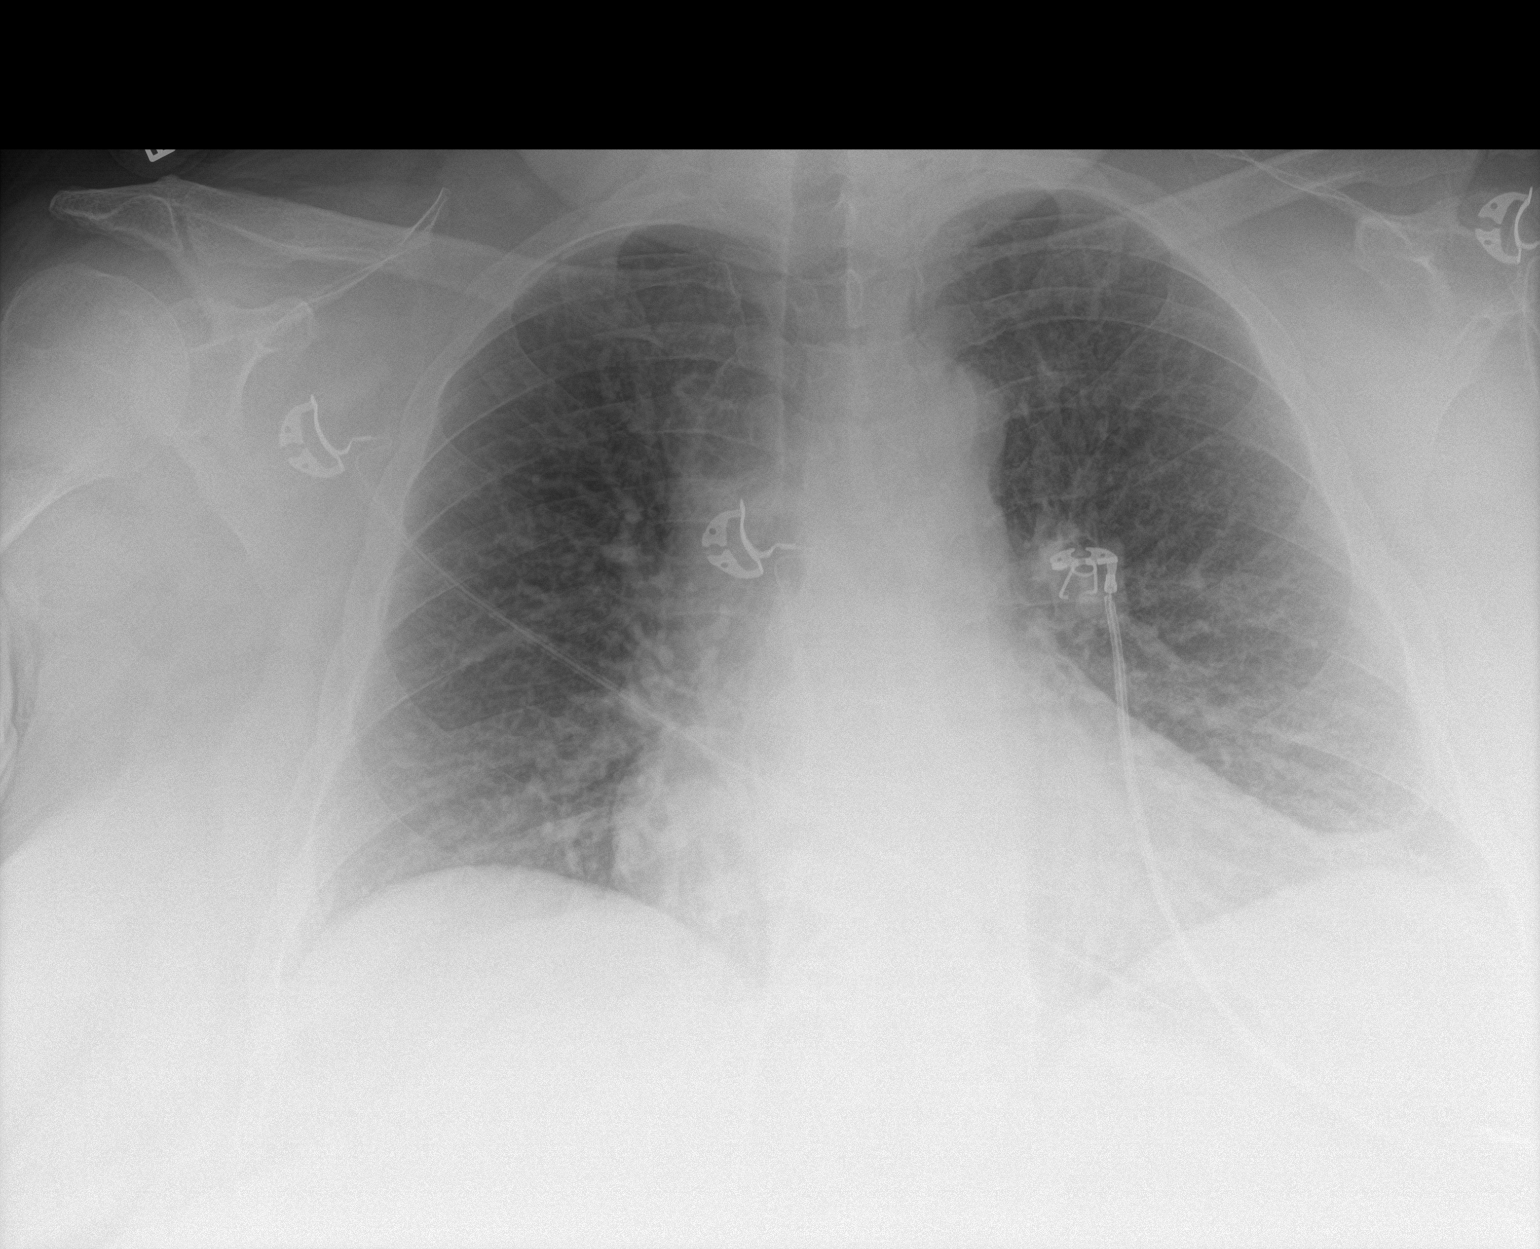

[2 of 2 positions shown; findings below may reference images not displayed]

FINDINGS: Lungs are clear.  No pleural effusion or pneumothorax.

The heart is top-normal in size.

Mild degenerative changes of the visualized thoracolumbar spine.
IMPRESSION: No evidence of acute cardiopulmonary disease.

## 2017-05-20 ENCOUNTER — Encounter: Payer: Self-pay | Admitting: Family Medicine

## 2017-05-20 ENCOUNTER — Encounter: Payer: Self-pay | Admitting: Nurse Practitioner

## 2017-05-20 ENCOUNTER — Ambulatory Visit (INDEPENDENT_AMBULATORY_CARE_PROVIDER_SITE_OTHER): Payer: 59 | Admitting: Nurse Practitioner

## 2017-05-20 VITALS — Temp 98.1°F | Ht 67.0 in | Wt 324.8 lb

## 2017-05-20 DIAGNOSIS — K429 Umbilical hernia without obstruction or gangrene: Secondary | ICD-10-CM | POA: Diagnosis not present

## 2017-05-20 MED ORDER — MUPIROCIN 2 % EX OINT: TOPICAL_OINTMENT | CUTANEOUS | 0 refills | Status: DC

## 2017-05-21 ENCOUNTER — Encounter: Payer: Self-pay | Admitting: Nurse Practitioner

## 2017-05-21 NOTE — Progress Notes (Signed)
Subjective: Presents for complaints of bleeding around her bellybutton area for the past 2 days.  States she woke up and a small amount of blood was noted on her underpants.  No fever.  Area nontender.  No abdominal tenderness.  Objective:   Temp 98.1 F (36.7 C) (Axillary)   Ht 5\' 7"  (1.702 m)   Wt (!) 324 lb 12.8 oz (147.3 kg)   BMI 50.87 kg/m  NAD.  Alert, oriented.  Small soft pink umbilical hernia noted at the upper portion of the umbilicus.  A small amount of dried blood was noted.  Area around the hernia is very deep but no open areas were palpated.  A small amount of dead skin was removed with a faint odor.  Once this occurred, patient began having a small amount of bright red blood again.  Topical cautery sticks applied inside the umbilical area, no further bleeding.  No complaints of pain with palpation or during procedure.  Assessment:  Umbilical hernia without obstruction and without gangrene    Plan:   Meds ordered this encounter  Medications  . mupirocin ointment (BACTROBAN) 2 %    Sig: Apply BID to affected area prn    Dispense:  22 g    Refill:  0    Order Specific Question:   Supervising Provider    Answer:   Merlyn AlbertLUKING, WILLIAM S [[id number]   Reviewed warning signs.  Call back if bleeding recurs or if any new symptoms develop.

## 2017-06-03 ENCOUNTER — Other Ambulatory Visit: Payer: Self-pay | Admitting: Family Medicine

## 2017-06-03 MED FILL — NAPROXEN 500 MG TABLET: 500 | 30 days supply | Qty: 60 | Fill #2

## 2017-06-03 MED FILL — ALLOPURINOL 100 MG TABLET: 100 | 90 days supply | Qty: 90 | Fill #0

## 2017-06-03 MED FILL — PRAVASTATIN SODIUM 20 MG TA: 20 | 90 days supply | Qty: 90 | Fill #0

## 2017-06-04 ENCOUNTER — Other Ambulatory Visit: Payer: Self-pay | Admitting: Family Medicine

## 2017-06-04 ENCOUNTER — Telehealth: Payer: Self-pay | Admitting: *Deleted

## 2017-06-04 DIAGNOSIS — Z1231 Encounter for screening mammogram for malignant neoplasm of breast: Secondary | ICD-10-CM

## 2017-06-04 MED FILL — TORSEMIDE 10 MG TABLET: 10 | 30 days supply | Qty: 360 | Fill #0

## 2017-06-04 NOTE — Telephone Encounter (Signed)
[facility]. Pt toresmide  is on back order she takes 3qam and 3 noon. Pharm wants to know if it is ok to give her  and take 6 qam and 6 tablets at noon for this month only. Ok per dr Lorin Picket as long as pharm lets pt know of change and notify her when the rx goes back to  so she will not be doubling up on . Pharm states pt is aware and will make sure she is aware when it goes back to .

## 2017-06-05 ENCOUNTER — Other Ambulatory Visit: Payer: Self-pay | Admitting: Family Medicine

## 2017-06-05 MED FILL — POTASSIUM CL ER 20 MEQ TABL: 20 | 90 days supply | Qty: 180 | Fill #0

## 2017-06-11 MED FILL — TRULICITY 1.5 MG/0.5 ML PEN: 1.5 | 28 days supply | Qty: 2 | Fill #4

## 2017-06-12 ENCOUNTER — Emergency Department (HOSPITAL_COMMUNITY)
Admission: EM | Admit: 2017-06-12 | Discharge: 2017-06-12 | Disposition: A | Discharge status: Home / Self Care | Payer: 59 | Attending: Emergency Medicine | Admitting: Emergency Medicine

## 2017-06-12 ENCOUNTER — Encounter (HOSPITAL_COMMUNITY): Payer: Self-pay | Admitting: *Deleted

## 2017-06-12 ENCOUNTER — Other Ambulatory Visit: Payer: Self-pay

## 2017-06-12 DIAGNOSIS — Z7902 Long term (current) use of antithrombotics/antiplatelets: Secondary | ICD-10-CM | POA: Diagnosis not present

## 2017-06-12 DIAGNOSIS — E039 Hypothyroidism, unspecified: Secondary | ICD-10-CM | POA: Diagnosis not present

## 2017-06-12 DIAGNOSIS — Z87891 Personal history of nicotine dependence: Secondary | ICD-10-CM | POA: Insufficient documentation

## 2017-06-12 DIAGNOSIS — R198 Other specified symptoms and signs involving the digestive system and abdomen: Secondary | ICD-10-CM | POA: Diagnosis not present

## 2017-06-12 DIAGNOSIS — Z79899 Other long term (current) drug therapy: Secondary | ICD-10-CM | POA: Diagnosis not present

## 2017-06-12 DIAGNOSIS — I10 Essential (primary) hypertension: Secondary | ICD-10-CM | POA: Insufficient documentation

## 2017-06-12 DIAGNOSIS — E119 Type 2 diabetes mellitus without complications: Secondary | ICD-10-CM | POA: Diagnosis not present

## 2017-06-12 DIAGNOSIS — K4090 Unilateral inguinal hernia, without obstruction or gangrene, not specified as recurrent: Secondary | ICD-10-CM | POA: Diagnosis present

## 2017-06-12 MED ORDER — SILVER NITRATE-POT NITRATE 75-25 % EX MISC
CUTANEOUS | Status: AC
  Administered 2017-06-12: 21:00:00
  Filled 2017-06-12: qty 2

## 2017-06-12 NOTE — ED Provider Notes (Signed)
[facility] EMERGENCY DEPARTMENT Provider Note   CSN: [id number] Arrival date & time: 06/12/17  1844     History   Chief Complaint Chief Complaint  Patient presents with  . Hernia    HPI Amy Hunter is a 57 y.o. female.  HPI  57 year old female presents today complaining of bleeding at the base of her umbilicus.  States that she was seen a couple weeks ago by Dr. Gerda Diss and diagnosed with umbilical hernia.  She has noticed some bleeding from the area today.  Denies any injury to the area.  She does take aspirin but no other blood thinners.  She denies any pain.  She is a diabetic and states her blood sugars are well controlled.  She has not had any spreading redness, nausea, vomiting, or diarrhea.  Past Medical History:  Diagnosis Date  . Allergy   . Diabetes mellitus   . Hypertension   . Sleep apnea     Patient Active Problem List   Diagnosis Date Noted  . Type 2 diabetes mellitus without complication (HCC) 08/17/2015  . Hyperlipidemia 11/10/2012  . Hypothyroidism 11/10/2012  . Obstructive sleep apnea 05/03/2012  . Dyspnea on exertion 03/30/2012  . Morbid obesity (HCC) 03/30/2012  . Diabetes mellitus type 2, controlled, with complications (HCC) 03/30/2012  . Varicose veins of lower extremities with other complications 12/23/2011  . Venous insufficiency 09/16/2011  . TRIGGER FINGER 12/11/2008    Past Surgical History:  Procedure Laterality Date  . CHOLECYSTECTOMY    . VARICOSE VEIN SURGERY Bilateral    laser treatment--ligation     OB History    Gravida  1   Para  1   Term  1   Preterm      AB      Living  1     SAB      TAB      Ectopic      Multiple      Live Births               Home Medications    Prior to Admission medications   Medication Sig Start Date End Date Taking? Authorizing Provider  albuterol (PROVENTIL HFA;VENTOLIN HFA) 108 (90 BASE) MCG/ACT inhaler Inhale 2 puffs into the lungs every 6 (six) hours as needed for  wheezing. 12/11/14   Babs Sciara, MD  allopurinol (ZYLOPRIM) 100 MG tablet TAKE 1 TABLET BY MOUTH ONCE DAILY 06/03/17   Babs Sciara, MD  ALPRAZolam (XANAX) 1 MG tablet TAKE 1 TABLET BY MOUTH 3 TIMES PER DAY AS NEEDED FOR ANXIETY OR SLEEP 04/01/17   Luking, Jonna Coup, MD  colchicine 0.6 MG tablet TAKE 1 TABLET (0.6 MG TOTAL) BY MOUTH DAILY. 12/18/16   Babs Sciara, MD  Cyanocobalamin (VITAMIN B-12) 2000 MCG TBCR Take 1 tablet by mouth daily.    [provider]  diclofenac (VOLTAREN) 75 MG EC tablet TAKE 1 TABLET (75 MG TOTAL) BY MOUTH 2 (TWO) TIMES DAILY. 01/01/17   Babs Sciara, MD  glipiZIDE (GLUCOTROL) 5 MG tablet One tablet in am and 1/2 tablet at supper 02/09/17   Babs Sciara, MD  HYDROcodone-acetaminophen (NORCO) 10-325 MG tablet Take 1 tablet by mouth every 4 (four) hours as needed. 09/16/16   Babs Sciara, MD  levothyroxine (SYNTHROID, LEVOTHROID) 200 MCG tablet TAKE 1 TABLET BY MOUTH DAILY AND 1 & 1/2 TABLETS BY MOUTH EVERY MONDAY 04/02/16   Babs Sciara, MD  lisinopril (PRINIVIL,ZESTRIL) 2.5 MG tablet TAKE  1 TABLET (2.5 MG TOTAL) BY MOUTH EVERY MORNING. 04/13/17   Babs Sciara, MD  metoprolol succinate (TOPROL-XL) 50 MG 24 hr tablet TAKE 1 TABLET BY MOUTH DAILY WITH OR IMMEDIATELY FOLLOWING A MEAL. 04/13/17   Babs Sciara, MD  mometasone (ELOCON) 0.1 % cream APPLY TOPICALLY 2 TIMES DAILY AS NEEDED 05/12/16   Babs Sciara, MD  mupirocin ointment (BACTROBAN) 2 % Apply BID to affected area prn 05/20/17   Campbell Riches, NP  naproxen (NAPROSYN) 500 MG tablet TAKE 1 TABLET BY MOUTH 2 TIMES DAILY WITH A MEAL. 04/01/17   Babs Sciara, MD  ondansetron (ZOFRAN ODT) 8 MG disintegrating tablet Take 1 tablet (8 mg total) by mouth every 8 (eight) hours as needed for nausea or vomiting. 03/21/15   Babs Sciara, MD  ondansetron (ZOFRAN) 8 MG tablet One po Tid for n&v 03/09/17   Luking, Scott A, MD  potassium chloride SA (K-DUR,KLOR-CON) 20 MEQ tablet TAKE 1 TABLET (20 MEQ  TOTAL) BY MOUTH 2 (TWO) TIMES DAILY. 06/05/17   Babs Sciara, MD  pravastatin (PRAVACHOL) 20 MG tablet TAKE 1 TABLET BY BY MOUTH EVERY EVENING 06/03/17   Babs Sciara, MD  torsemide (DEMADEX) 20 MG tablet TAKE 3 TABLETS BY MOUTH EVERY MORNING AND 3 TABLETS AT NOON 06/03/17   Babs Sciara, MD  UNIFINE PENTIPS 31G X 8 MM MISC USE AS DIRECTED DAILY WITH VICTOZA 05/22/15   Babs Sciara, MD    Family History Family History  Problem Relation Age of Onset  . Diabetes Mother   . Hypertension Mother   . Heart disease Mother   . Breast cancer Mother   . Lung cancer Mother   . Heart disease Brother   . Heart disease Father   . Lung cancer Father   . Brain cancer Father   . Asthma Father   . Lung cancer Maternal Grandmother   . Colon cancer Paternal Grandmother     Social History Social History   Tobacco Use  . Smoking status: Former Smoker    Packs/day: 0.50    Years: 15.00    Pack years: 7.50    Types: Cigarettes    Last attempt to quit: 02/03/2010    Years since quitting: 7.3  . Smokeless tobacco: Never Used  . Tobacco comment: pt reports she smoked "off and on" for the entire smoking years.   Substance Use Topics  . Alcohol use: No  . Drug use: No     Allergies   Benadryl [diphenhydramine hcl] and Indocin [indomethacin]   Review of Systems Review of Systems  All other systems reviewed and are negative.    Physical Exam Updated Vital Signs BP (!) 172/90   Pulse 93   Temp 98.3 F (36.8 C)   Resp 18   Ht 1.626 m ( )   Wt (!) 147 kg (324 lb)   SpO2 97%   BMI 55.61 kg/m   Physical Exam  Constitutional: She appears well-developed and well-nourished.  HENT:  Head: Normocephalic and atraumatic.  Eyes: Pupils are equal, round, and reactive to light.  Neck: Normal range of motion.  Cardiovascular: Normal rate and regular rhythm.  Pulmonary/Chest: Effort normal.  Abdominal: Soft. Bowel sounds are normal. She exhibits distension. She exhibits no mass. There  is no tenderness. There is no rebound and no guarding. A hernia is present.  Small umbilical hernia with some irritation Silver nitrate applied and bleeding controlled  Musculoskeletal: Normal range of motion.  Neurological: She is alert.  Skin: Skin is warm. Capillary refill takes less than 2 seconds.  Psychiatric: She has a normal mood and affect.  Nursing note and vitals reviewed.    ED Treatments / Results  Labs (all labs ordered are listed, but only abnormal results are displayed) Labs Reviewed - No data to display  EKG None  Radiology No results found.  Procedures Procedures (including critical care time)  Medications Ordered in ED Medications  silver nitrate applicators 75-25 % applicator (has no administration in time range)     Initial Impression / Assessment and Plan / ED Course  I have reviewed the triage vital signs and the nursing notes.  Pertinent labs & imaging results that were available during my care of the patient were reviewed by me and considered in my medical decision making (see chart for details).      Final Clinical Impressions(s) / ED Diagnoses   Final diagnoses:  Umbilical bleeding    ED Discharge Orders    None       Margarita Grizzle, MD 06/12/17 2049

## 2017-06-12 NOTE — ED Triage Notes (Addendum)
Pt c/o umbilical hernia that started bleeding today. Pt c/o burning at area of hernia.

## 2017-06-12 NOTE — Discharge Instructions (Signed)
There was bleeding from irritated area at base of umbilicus.  Silver nitrate was applied.  Stay flat and keep dressing in place.  Stop aspirin until recheck with Dr. Gerda Diss.

## 2017-06-15 ENCOUNTER — Ambulatory Visit: Payer: 59 | Admitting: Nurse Practitioner

## 2017-06-15 ENCOUNTER — Encounter: Payer: Self-pay | Admitting: Nurse Practitioner

## 2017-06-15 VITALS — BP 128/82 | Ht 67.0 in | Wt 324.0 lb

## 2017-06-15 DIAGNOSIS — R198 Other specified symptoms and signs involving the digestive system and abdomen: Secondary | ICD-10-CM | POA: Diagnosis not present

## 2017-06-15 DIAGNOSIS — K429 Umbilical hernia without obstruction or gangrene: Secondary | ICD-10-CM | POA: Diagnosis not present

## 2017-06-15 DIAGNOSIS — Z634 Disappearance and death of family member: Secondary | ICD-10-CM | POA: Diagnosis not present

## 2017-06-15 DIAGNOSIS — E039 Hypothyroidism, unspecified: Secondary | ICD-10-CM | POA: Diagnosis not present

## 2017-06-15 DIAGNOSIS — M109 Gout, unspecified: Secondary | ICD-10-CM | POA: Diagnosis not present

## 2017-06-15 DIAGNOSIS — E7849 Other hyperlipidemia: Secondary | ICD-10-CM | POA: Diagnosis not present

## 2017-06-15 DIAGNOSIS — E119 Type 2 diabetes mellitus without complications: Secondary | ICD-10-CM | POA: Diagnosis not present

## 2017-06-15 NOTE — Progress Notes (Signed)
Subjective: Presents for recheck after having another episode of bleeding from her umbilical area on 5/10.  This is the second episode she has had, see note for 05/20/2017.  The ED also applied silver nitrate which has helped stop the bleeding, continues to have some clear brownish drainage.  Area remains nontender.  No fever.  Also patient is dealing with the recent loss of her husband.  Does not feel she needs medication or counseling at this point.  Has excellent support system. Depression screen [facility] 2/9 04/17/2017 04/02/2016 04/05/2014 05/30/2013  Decreased Interest 3 0 0 0  Down, Depressed, Hopeless 3 0 0 0  PHQ - 2 Score 6 0 0 0  Altered sleeping 2 - - -  Tired, decreased energy 2 - - -  Change in appetite 2 - - -  Feeling bad or failure about yourself  2 - - -  Trouble concentrating 2 - - -  Moving slowly or fidgety/restless 2 - - -  Suicidal thoughts 0 - - -  PHQ-9 Score 18 - - -  Difficult doing work/chores Very difficult - - -     Objective:   BP 128/82   Ht  (1.702 m)   Wt (!) 324 lb (147 kg)   BMI 50.75 kg/m  NAD.  Alert, oriented.  Thoughts logical coherent and relevant.  Dressed appropriately.  Making good eye contact.  Tearful a couple of times during the visit when discussing her husband.  Lungs clear.  Heart regular rate and rhythm.  A small hernia is noted in the umbilical area with a small amount of clear brownish drainage.  Assessment:  Umbilical hernia without obstruction and without gangrene  Umbilical bleeding  Bereavement    Plan: We will refer to general surgeon for evaluation and treatment.  Patient to call back in the meantime if any further problems.  Warning signs were reviewed.  Also call back if she feels she needs medication or counseling for bereavement.

## 2017-06-16 LAB — BASIC METABOLIC PANEL
BUN/Creatinine Ratio: 25 — ABNORMAL HIGH (ref 9–23)
BUN: 15 mg/dL (ref 6–24)
CALCIUM: 9.1 mg/dL (ref 8.7–10.2)
CO2: 22 mmol/L (ref 20–29)
CREATININE: 0.59 mg/dL (ref 0.57–1.00)
Chloride: 102 mmol/L (ref 96–106)
GFR, EST AFRICAN AMERICAN: 118 mL/min/{1.73_m2} (ref >59)
GFR, EST NON AFRICAN AMERICAN: 102 mL/min/{1.73_m2} (ref >59)
Glucose: 96 mg/dL (ref 65–99)
Potassium: 4.2 mmol/L (ref 3.5–5.2)
Sodium: 141 mmol/L (ref 134–144)

## 2017-06-16 LAB — HEPATIC FUNCTION PANEL
ALBUMIN: 4.1 g/dL (ref 3.5–5.5)
ALT: 34 IU/L — ABNORMAL HIGH (ref 0–32)
AST: 27 IU/L (ref 0–40)
Alkaline Phosphatase: 204 IU/L — ABNORMAL HIGH (ref 39–117)
BILIRUBIN TOTAL: 0.3 mg/dL (ref 0.0–1.2)
BILIRUBIN, DIRECT: 0.12 mg/dL (ref 0.00–0.40)
TOTAL PROTEIN: 6.8 g/dL (ref 6.0–8.5)

## 2017-06-16 LAB — LIPID PANEL
CHOL/HDL RATIO: 3 ratio (ref 0.0–4.4)
Cholesterol, Total: 137 mg/dL (ref 100–199)
HDL: 46 mg/dL (ref >39)
LDL CALC: 73 mg/dL (ref 0–99)
Triglycerides: 91 mg/dL (ref 0–149)
VLDL Cholesterol Cal: 18 mg/dL (ref 5–40)

## 2017-06-16 LAB — TSH: TSH: 2.17 u[IU]/mL (ref 0.450–4.500)

## 2017-06-16 LAB — URIC ACID: Uric Acid: 7.9 mg/dL — ABNORMAL HIGH (ref 2.5–7.1)

## 2017-06-16 LAB — HEMOGLOBIN A1C
ESTIMATED AVERAGE GLUCOSE: 123 mg/dL
Hgb A1c MFr Bld: 5.9 % — ABNORMAL HIGH (ref 4.8–5.6)

## 2017-06-18 ENCOUNTER — Ambulatory Visit (INDEPENDENT_AMBULATORY_CARE_PROVIDER_SITE_OTHER): Payer: 59 | Admitting: Family Medicine

## 2017-06-18 ENCOUNTER — Encounter: Payer: Self-pay | Admitting: Family Medicine

## 2017-06-18 ENCOUNTER — Encounter (INDEPENDENT_AMBULATORY_CARE_PROVIDER_SITE_OTHER): Payer: Self-pay

## 2017-06-18 VITALS — BP 138/86 | Ht 67.0 in | Wt 315.8 lb

## 2017-06-18 DIAGNOSIS — Z0001 Encounter for general adult medical examination with abnormal findings: Secondary | ICD-10-CM

## 2017-06-18 DIAGNOSIS — Z0000 Encounter for general adult medical examination without abnormal findings: Secondary | ICD-10-CM

## 2017-06-18 DIAGNOSIS — E7849 Other hyperlipidemia: Secondary | ICD-10-CM

## 2017-06-18 DIAGNOSIS — E039 Hypothyroidism, unspecified: Secondary | ICD-10-CM | POA: Diagnosis not present

## 2017-06-18 DIAGNOSIS — E119 Type 2 diabetes mellitus without complications: Secondary | ICD-10-CM | POA: Diagnosis not present

## 2017-06-18 MED ORDER — DULAGLUTIDE 1.5 MG/0.5ML ~~LOC~~ SOAJ: SUBCUTANEOUS | 5 refills | Status: DC

## 2017-06-18 MED ORDER — METOPROLOL SUCCINATE ER 50 MG PO TB24: ORAL_TABLET | ORAL | 1 refills | Status: DC

## 2017-06-18 MED ORDER — ALLOPURINOL 300 MG PO TABS: 300.0000 mg | ORAL_TABLET | Freq: Every day | ORAL | 1 refills | Status: DC

## 2017-06-18 MED ORDER — LEVOTHYROXINE SODIUM 200 MCG PO TABS: ORAL_TABLET | ORAL | 3 refills | Status: DC

## 2017-06-18 MED FILL — ALLOPURINOL 300 MG TABLET: 300 | 90 days supply | Qty: 90 | Fill #0

## 2017-06-18 MED FILL — LEVOTHYROXINE 200 MCG TAB: 200 | 43 days supply | Qty: 108 | Fill #0

## 2017-06-18 NOTE — Progress Notes (Signed)
Subjective:    Patient ID: [name], female    DOB: Mar 08, 1960, 57 y.o.   MRN: [id number]  HPI The patient comes in today for a wellness visit.    A review of their health history was completed.  A review of medications was also completed.  Any needed refills; colchicine  Eating habits: sometimes eats once a day sometimes twice, depends on mood, tries to eat variety of foods  Falls/  MVA accidents in past few months: no  Regular exercise: no, walks all day at work  Specialist pt sees on regular basis: no  Preventative health issues were discussed.   Additional concerns: none (Pt goes to OB-GYN for female exam)   Patient has thyroid condition.  Takes thyroid medication on a regular basis.  States that the proper way.  Relates compliance.  States no negative side effects.  States condition seems to be under good control.  The patient was seen today as part of a comprehensive diabetic check up.The patient relates medication compliance. No significant side effects to the medications. Denies any low glucose spells. Relates compliance with diet to a reasonable level. Patient does do labwork intermittently and understands the dangers of diabetes.  The patient's BMI is calculated.  It is in the vital signs and acknowledged.  It is above the recommended BMI for the patient's height and weight.  The patient has been counseled regarding healthy diet, restricted portions, avoiding excessive carbohydrates/sugary foods, and increase physical activity as health permits.  It is in the patient's best interest to lower the risk of secondary illness including heart disease strokes and cancer by losing weight.  The patient acknowledges this information.   Results for orders placed or performed in visit on 04/17/17  Lipid panel  Result Value Ref Range   Cholesterol, Total 137 100 - 199 mg/dL   Triglycerides 91 0 - 149 mg/dL   HDL 46 >78 mg/dL   VLDL Cholesterol Cal 18 5 - 40 mg/dL   LDL Calculated 73 0 - 99 mg/dL   Chol/HDL Ratio 3.0 0.0 - 4.4 ratio  Hepatic function panel  Result Value Ref Range   Total Protein 6.8 6.0 - 8.5 g/dL   Albumin 4.1 3.5 - 5.5 g/dL   Bilirubin Total 0.3 0.0 - 1.2 mg/dL   Bilirubin, Direct 2.95 0.00 - 0.40 mg/dL   Alkaline Phosphatase 204 (H) 39 - 117 IU/L   AST 27 0 - 40 IU/L   ALT 34 (H) 0 - 32 IU/L  TSH  Result Value Ref Range   TSH 2.170 0.450 - 4.500 uIU/mL  Hemoglobin A1c  Result Value Ref Range   Hgb A1c MFr Bld 5.9 (H) 4.8 - 5.6 %   Est. average glucose Bld gHb Est-mCnc 123 mg/dL  Basic metabolic panel  Result Value Ref Range   Glucose 96 65 - 99 mg/dL   BUN 15 6 - 24 mg/dL   Creatinine, Ser 6.21 0.57 - 1.00 mg/dL   GFR calc non Af Amer 102 >59 mL/min/1.73   GFR calc Af Amer 118 >59 mL/min/1.73   BUN/Creatinine Ratio 25 (H) 9 - 23   Sodium 141 134 - 144 mmol/L   Potassium 4.2 3.5 - 5.2 mmol/L   Chloride 102 96 - 106 mmol/L   CO2 22 20 - 29 mmol/L   Calcium 9.1 8.7 - 10.2 mg/dL  Uric acid  Result Value Ref Range   Uric Acid 7.9 (H) 2.5 - 7.1 mg/dL    Review of  Systems  Constitutional: Negative for activity change, appetite change and fatigue.  HENT: Negative for congestion and rhinorrhea.   Eyes: Negative for discharge.  Respiratory: Negative for cough, chest tightness and wheezing.   Cardiovascular: Negative for chest pain.  Gastrointestinal: Negative for abdominal pain, blood in stool and vomiting.  Endocrine: Negative for polyphagia.  Genitourinary: Negative for difficulty urinating and frequency.  Musculoskeletal: Negative for neck pain.  Skin: Negative for color change.  Allergic/Immunologic: Negative for environmental allergies and food allergies.  Neurological: Negative for weakness and headaches.  Psychiatric/Behavioral: Negative for agitation and behavioral problems.       Objective:   Physical Exam  Constitutional: She is oriented to person, place, and time. She appears well-developed and  well-nourished.  HENT:  Head: Normocephalic.  Right Ear: External ear normal.  Left Ear: External ear normal.  Eyes: Right eye exhibits no discharge. Left eye exhibits no discharge.  Neck: No tracheal deviation present.  Cardiovascular: Normal rate, regular rhythm, normal heart sounds and intact distal pulses.  No murmur heard. Pulmonary/Chest: Effort normal and breath sounds normal. No respiratory distress. She has no wheezes.  Abdominal: Soft. Bowel sounds are normal. She exhibits no distension and no mass. There is no tenderness.  Musculoskeletal: Normal range of motion. She exhibits no edema or tenderness.  Lymphadenopathy:    She has no cervical adenopathy.  Neurological: She is alert and oriented to person, place, and time. She exhibits normal muscle tone.  Skin: Skin is warm and dry.  Psychiatric: She has a normal mood and affect. Her behavior is normal.          Assessment & Plan:  Adult wellness-complete.wellness physical was conducted today. Importance of diet and exercise were discussed in detail.  In addition to this a discussion regarding safety was also covered. We also reviewed over immunizations and gave recommendations regarding current immunization needed for age.  In addition to this additional areas were also touched on including: Preventative health exams needed: Colonoscopy patient is considering she states she will probably do it later this year  Patient was advised yearly wellness exam  The patient was seen today as part of a comprehensive visit for diabetes. The importance of keeping her A1c at or below 7 was discussed.  Importance of regular physical activity was discussed.   The importance of adherence to medication as well as a controlled low starch/sugar diet was also discussed.  Standard follow-up visit recommended.  Finally failure to follow good diabetic measures including self effort and compliance with recommendations can certainly increase the  risk of heart disease strokes kidney failure blindness loss of limb and early death was discussed with the patient.  Patient was seen today regarding hypothyroidism.  Importance of healthy diet, regular physical activity was discussed.  Importance of compliance with medication and regular checks regarding this was discussed.   The patient was seen today as part of an evaluation regarding hyperlipidemia.  Recent lab work has been reviewed with the patient as well as the goals for good cholesterol care.  In addition to this medications have been discussed the importance of compliance with diet and medications discussed as well.  Finally the patient is aware that poor control of cholesterol, noncompliance can dramatically increase her risk of heart attack strokes and premature death.  The patient will keep regular office visits and the patient does agreed to periodic lab work. Recent grief patient is doing better since the loss of her husband denies being depressed  Morbid obesity  patient was encouraged to watch diet cut back on portions try to lose weight

## 2017-06-18 NOTE — Progress Notes (Signed)
Per Toribio Harbour at [facility] yes pt takes Trulicity 1.5 she uses one pen once per week. I have put this in the system.

## 2017-06-19 ENCOUNTER — Emergency Department (HOSPITAL_COMMUNITY)
Admission: EM | Admit: 2017-06-19 | Discharge: 2017-06-20 | Disposition: A | Discharge status: Home / Self Care | Payer: 59 | Attending: Emergency Medicine | Admitting: Emergency Medicine

## 2017-06-19 ENCOUNTER — Other Ambulatory Visit: Payer: Self-pay

## 2017-06-19 ENCOUNTER — Emergency Department (HOSPITAL_COMMUNITY): Payer: 59

## 2017-06-19 ENCOUNTER — Encounter (HOSPITAL_COMMUNITY): Payer: Self-pay | Admitting: Emergency Medicine

## 2017-06-19 DIAGNOSIS — Z79899 Other long term (current) drug therapy: Secondary | ICD-10-CM | POA: Insufficient documentation

## 2017-06-19 DIAGNOSIS — Z87891 Personal history of nicotine dependence: Secondary | ICD-10-CM | POA: Insufficient documentation

## 2017-06-19 DIAGNOSIS — E039 Hypothyroidism, unspecified: Secondary | ICD-10-CM | POA: Diagnosis not present

## 2017-06-19 DIAGNOSIS — R198 Other specified symptoms and signs involving the digestive system and abdomen: Secondary | ICD-10-CM | POA: Diagnosis not present

## 2017-06-19 DIAGNOSIS — I10 Essential (primary) hypertension: Secondary | ICD-10-CM | POA: Insufficient documentation

## 2017-06-19 DIAGNOSIS — E119 Type 2 diabetes mellitus without complications: Secondary | ICD-10-CM | POA: Diagnosis not present

## 2017-06-19 DIAGNOSIS — K429 Umbilical hernia without obstruction or gangrene: Secondary | ICD-10-CM | POA: Diagnosis not present

## 2017-06-19 LAB — COMPREHENSIVE METABOLIC PANEL
ALK PHOS: 144 U/L — AB (ref 38–126)
ALT: 31 U/L (ref 14–54)
ANION GAP: 7 (ref 5–15)
AST: 24 U/L (ref 15–41)
Albumin: 3.2 g/dL — ABNORMAL LOW (ref 3.5–5.0)
BUN: 14 mg/dL (ref 6–20)
CALCIUM: 8.7 mg/dL — AB (ref 8.9–10.3)
CO2: 27 mmol/L (ref 22–32)
CREATININE: 0.59 mg/dL (ref 0.44–1.00)
Chloride: 106 mmol/L (ref 101–111)
Glucose, Bld: 115 mg/dL — ABNORMAL HIGH (ref 65–99)
Potassium: 3.5 mmol/L (ref 3.5–5.1)
Sodium: 140 mmol/L (ref 135–145)
TOTAL PROTEIN: 6.2 g/dL — AB (ref 6.5–8.1)
Total Bilirubin: 0.6 mg/dL (ref 0.3–1.2)

## 2017-06-19 LAB — LIPASE, BLOOD: LIPASE: 30 U/L (ref 11–51)

## 2017-06-19 LAB — CBC WITH DIFFERENTIAL/PLATELET
BASOS ABS: 0 10*3/uL (ref 0.0–0.1)
Basophils Relative: 0 %
EOS ABS: 0.3 10*3/uL (ref 0.0–0.7)
Eosinophils Relative: 3 %
HEMATOCRIT: 36.9 % (ref 36.0–46.0)
HEMOGLOBIN: 12 g/dL (ref 12.0–15.0)
Lymphocytes Relative: 34 %
Lymphs Abs: 3.3 10*3/uL (ref 0.7–4.0)
MCH: 27.3 pg (ref 26.0–34.0)
MCHC: 32.5 g/dL (ref 30.0–36.0)
MCV: 83.9 fL (ref 78.0–100.0)
MONOS PCT: 10 %
Monocytes Absolute: 1 10*3/uL (ref 0.1–1.0)
NEUTROS ABS: 5 10*3/uL (ref 1.7–7.7)
Neutrophils Relative %: 53 %
Platelets: 195 10*3/uL (ref 150–400)
RBC: 4.4 MIL/uL (ref 3.87–5.11)
RDW: 15.3 % (ref 11.5–15.5)
WBC: 9.6 10*3/uL (ref 4.0–10.5)

## 2017-06-19 MED ORDER — SODIUM CHLORIDE 0.9 % IV SOLN
INTRAVENOUS | Status: DC
  Administered 2017-06-19: 23:00:00 via INTRAVENOUS

## 2017-06-19 MED ORDER — IOPAMIDOL (ISOVUE-300) INJECTION 61%
100.0000 mL | Freq: Once | INTRAVENOUS | Status: AC | PRN
  Administered 2017-06-19: 100 mL via INTRAVENOUS

## 2017-06-19 NOTE — ED Triage Notes (Signed)
Seen last week for umbilical  bleeding hernia, surgery consultation scheduled for next week.  Bleeding again

## 2017-06-19 NOTE — ED Provider Notes (Signed)
[facility] EMERGENCY DEPARTMENT Provider Note   CSN: [id number] Arrival date & time: 06/19/17  1946     History   Chief Complaint No chief complaint on file.   HPI Amy Hunter is a 57 y.o. female.  Patient with a 3-week history of intermittent umbilical bleeding.  Dark red blood.  Sometimes large amounts.  Patient has been seen in the emergency department twice for this and once by primary care provider.  Patient has not had a CT scan of the abdomen.  Is been cauterized in the past.  Currently not actively bleeding but bled earlier today.  Patient denies any nausea or vomiting or diarrhea fevers or significant abdominal pain.  There is some discomfort around the umbilicus.  Patient was told that she has an umbilical hernia but there is been no study to prove that one way or the other.  Patient is never had anything like this in the past.  Past medical history significant for diabetes and hypertension sleep apnea.  Past surgical history includes varicose vein surgeries on her legs and gallbladder removal.  Patient is not on blood thinners.  Patient has been referred to follow-up with [facility].  She preferred to be followed in [place].  She has an appointment with them coming up this week.     Past Medical History:  Diagnosis Date  . Allergy   . Diabetes mellitus   . Hypertension   . Sleep apnea     Patient Active Problem List   Diagnosis Date Noted  . Type 2 diabetes mellitus without complication (HCC) 08/17/2015  . Hyperlipidemia 11/10/2012  . Hypothyroidism 11/10/2012  . Obstructive sleep apnea 05/03/2012  . Dyspnea on exertion 03/30/2012  . Morbid obesity (HCC) 03/30/2012  . Diabetes mellitus type 2, controlled, with complications (HCC) 03/30/2012  . Varicose veins of lower extremities with other complications 12/23/2011  . Venous insufficiency 09/16/2011  . TRIGGER FINGER 12/11/2008    Past Surgical History:  Procedure Laterality Date  .  CHOLECYSTECTOMY    . VARICOSE VEIN SURGERY Bilateral    laser treatment--ligation     OB History    Gravida  1   Para  1   Term  1   Preterm      AB      Living  1     SAB      TAB      Ectopic      Multiple      Live Births               Home Medications    Prior to Admission medications   Medication Sig Start Date End Date Taking? Authorizing Provider  albuterol (PROVENTIL HFA;VENTOLIN HFA) 108 (90 BASE) MCG/ACT inhaler Inhale 2 puffs into the lungs every 6 (six) hours as needed for wheezing. 12/11/14   Babs Sciara, MD  allopurinol (ZYLOPRIM) 300 MG tablet Take 1 tablet (300 mg total) by mouth daily. 06/18/17   Babs Sciara, MD  ALPRAZolam (XANAX) 1 MG tablet TAKE 1 TABLET BY MOUTH 3 TIMES PER DAY AS NEEDED FOR ANXIETY OR SLEEP 04/01/17   Luking, Jonna Coup, MD  colchicine 0.6 MG tablet TAKE 1 TABLET (0.6 MG TOTAL) BY MOUTH DAILY. 12/18/16   Babs Sciara, MD  Cyanocobalamin (VITAMIN B-12) 2000 MCG TBCR Take 1 tablet by mouth daily.    [provider]  diclofenac (VOLTAREN) 75 MG EC tablet TAKE 1 TABLET (75 MG TOTAL) BY MOUTH 2 (TWO)  TIMES DAILY. 01/01/17   Babs Sciara, MD  Dulaglutide (TRULICITY) 1.5 MG/0.5ML SOPN 1.5 one pen once per week 06/18/17   Babs Sciara, MD  HYDROcodone-acetaminophen (NORCO) 10-325 MG tablet Take 1 tablet by mouth every 4 (four) hours as needed. Patient not taking: Reported on 06/18/2017 09/16/16   Babs Sciara, MD  levothyroxine (SYNTHROID, LEVOTHROID) 200 MCG tablet TAKE 1 TABLET BY MOUTH DAILY AND 1 & 1/2 TABLETS BY MOUTH EVERY MONDAY 06/18/17   Babs Sciara, MD  lisinopril (PRINIVIL,ZESTRIL) 2.5 MG tablet TAKE 1 TABLET (2.5 MG TOTAL) BY MOUTH EVERY MORNING. 04/13/17   Babs Sciara, MD  metoprolol succinate (TOPROL-XL) 50 MG 24 hr tablet Take with or immediately following a meal. 06/18/17   Luking, Inell Mimbs A, MD  mometasone (ELOCON) 0.1 % cream APPLY TOPICALLY 2 TIMES DAILY AS NEEDED 05/12/16   Babs Sciara, MD    mupirocin ointment (BACTROBAN) 2 % Apply BID to affected area prn 05/20/17   Campbell Riches, NP  naproxen (NAPROSYN) 500 MG tablet TAKE 1 TABLET BY MOUTH 2 TIMES DAILY WITH A MEAL. 04/01/17   Babs Sciara, MD  ondansetron (ZOFRAN ODT) 8 MG disintegrating tablet Take 1 tablet (8 mg total) by mouth every 8 (eight) hours as needed for nausea or vomiting. 03/21/15   Babs Sciara, MD  ondansetron (ZOFRAN) 8 MG tablet One po Tid for n&v 03/09/17   Luking, Danijah Noh A, MD  potassium chloride SA (K-DUR,KLOR-CON) 20 MEQ tablet TAKE 1 TABLET (20 MEQ TOTAL) BY MOUTH 2 (TWO) TIMES DAILY. 06/05/17   Babs Sciara, MD  pravastatin (PRAVACHOL) 20 MG tablet TAKE 1 TABLET BY BY MOUTH EVERY EVENING 06/03/17   Babs Sciara, MD  torsemide (DEMADEX) 20 MG tablet TAKE 3 TABLETS BY MOUTH EVERY MORNING AND 3 TABLETS AT NOON 06/03/17   Babs Sciara, MD  UNIFINE PENTIPS 31G X 8 MM MISC USE AS DIRECTED DAILY WITH VICTOZA 05/22/15   Babs Sciara, MD    Family History Family History  Problem Relation Age of Onset  . Diabetes Mother   . Hypertension Mother   . Heart disease Mother   . Breast cancer Mother   . Lung cancer Mother   . Heart disease Brother   . Heart disease Father   . Lung cancer Father   . Brain cancer Father   . Asthma Father   . Lung cancer Maternal Grandmother   . Colon cancer Paternal Grandmother     Social History Social History   Tobacco Use  . Smoking status: Former Smoker    Packs/day: 0.50    Years: 15.00    Pack years: 7.50    Types: Cigarettes    Last attempt to quit: 02/03/2010    Years since quitting: 7.3  . Smokeless tobacco: Never Used  . Tobacco comment: pt reports she smoked "off and on" for the entire smoking years.   Substance Use Topics  . Alcohol use: No  . Drug use: No     Allergies   Benadryl [diphenhydramine hcl] and Indocin [indomethacin]   Review of Systems Review of Systems  Constitutional: Negative for fever.  HENT: Negative for congestion.    Eyes: Negative for visual disturbance.  Respiratory: Negative for shortness of breath.   Cardiovascular: Negative for chest pain.  Gastrointestinal: Positive for abdominal pain. Negative for diarrhea, nausea and vomiting.  Genitourinary: Negative for dysuria.  Musculoskeletal: Negative for back pain.  Skin: Positive for wound. Negative for  rash.  Neurological: Negative for dizziness and syncope.  Hematological: Does not bruise/bleed easily.  Psychiatric/Behavioral: Negative for confusion.     Physical Exam Updated Vital Signs BP 119/63 (BP Location: Left Arm)   Pulse 87   Temp 97.6 F (36.4 C) (Oral)   Resp 18   Ht 1.702 m ( )   Wt (!) 142.9 kg (315 lb)   SpO2 96%   BMI 49.34 kg/m   Physical Exam  Constitutional: She appears well-developed and well-nourished. No distress.  HENT:  Head: Normocephalic and atraumatic.  Mouth/Throat: Oropharynx is clear and moist.  Eyes: Pupils are equal, round, and reactive to light. Conjunctivae and EOM are normal.  Neck: Normal range of motion. Neck supple.  Pulmonary/Chest: Effort normal and breath sounds normal.  Abdominal: Soft. Bowel sounds are normal. She exhibits no mass. There is no tenderness.   umbilical area without any obvious herniation or mass.  Some dark red blood not proven in the umbilical area.  No active bleeding noted.  Nontender to palpation throughout the abdomen.  Nursing note and vitals reviewed.    ED Treatments / Results  Labs (all labs ordered are listed, but only abnormal results are displayed) Labs Reviewed  COMPREHENSIVE METABOLIC PANEL - Abnormal; Notable for the following components:      Result Value   Glucose, Bld 115 (*)    Calcium 8.7 (*)    Total Protein 6.2 (*)    Albumin 3.2 (*)    Alkaline Phosphatase 144 (*)    All other components within normal limits  LIPASE, BLOOD  CBC WITH DIFFERENTIAL/PLATELET    EKG None  Radiology No results found.  Procedures Procedures (including  critical care time)  Medications Ordered in ED Medications  0.9 %  sodium chloride infusion ( Intravenous New Bag/Given 06/19/17 2246)  iopamidol (ISOVUE-300) 61 % injection 100 mL (has no administration in time range)     Initial Impression / Assessment and Plan / ED Course  I have reviewed the triage vital signs and the nursing notes.  Pertinent labs & imaging results that were available during my care of the patient were reviewed by me and considered in my medical decision making (see chart for details).    Patient's labs without any significant abnormalities.  CT scan of abdomen ordered and is pending to further evaluate this.  Uracheal Cyst, is a possibility.  CT scan will further delineate.  Do not think cauterization would be helpful.  Would more recommend just packing with gauze patient has follow-up with general surgery next week.   Final Clinical Impressions(s) / ED Diagnoses   Final diagnoses:  Umbilical bleeding    ED Discharge Orders    None       Vanetta Mulders, MD 06/19/17 2338

## 2017-06-19 NOTE — ED Notes (Signed)
Pt was seen here last week for same problem. Pt presents with bleeding from umbilical due to hernia. Pt reports silver nitrate was applied last week and bleeding stopped for 3 days, then bleeding began again saturating her pants.

## 2017-06-20 MED ORDER — SULFAMETHOXAZOLE-TRIMETHOPRIM 800-160 MG PO TABS: 1.0000 | ORAL_TABLET | Freq: Two times a day (BID) | ORAL | 0 refills | Status: AC

## 2017-06-20 NOTE — ED Provider Notes (Signed)
Patient signed out to me to follow-up on CT scan.  Patient having intermittent bleeding from the area around her umbilicus.  CT scan ordered to evaluate for the possibility of hernia.  CT scan does show hernia containing only fat.  No evidence of obstruction.  Patient has had bleeding and there is some high intensity focus in the hernia.  She is afebrile, has a normal white blood cell count, all labs are reassuring.  Will cover for the possibility of infection with Bactrim.  Patient has follow-up scheduled with general surgery this week.  She does not need a more urgent follow-up at this time.  Results for orders placed or performed during the hospital encounter of 06/19/17  Lipase, blood  Result Value Ref Range   Lipase 30 11 - 51 U/L  Comprehensive metabolic panel  Result Value Ref Range   Sodium 140 135 - 145 mmol/L   Potassium 3.5 3.5 - 5.1 mmol/L   Chloride 106 101 - 111 mmol/L   CO2 27 22 - 32 mmol/L   Glucose, Bld 115 (H) 65 - 99 mg/dL   BUN 14 6 - 20 mg/dL   Creatinine, Ser 1.61 0.44 - 1.00 mg/dL   Calcium 8.7 (L) 8.9 - 10.3 mg/dL   Total Protein 6.2 (L) 6.5 - 8.1 g/dL   Albumin 3.2 (L) 3.5 - 5.0 g/dL   AST 24 15 - 41 U/L   ALT 31 14 - 54 U/L   Alkaline Phosphatase 144 (H) 38 - 126 U/L   Total Bilirubin 0.6 0.3 - 1.2 mg/dL   GFR calc non Af Amer >60 >60 mL/min   GFR calc Af Amer >60 >60 mL/min   Anion gap 7 5 - 15  CBC with Differential/Platelet  Result Value Ref Range   WBC 9.6 4.0 - 10.5 K/uL   RBC 4.40 3.87 - 5.11 MIL/uL   Hemoglobin 12.0 12.0 - 15.0 g/dL   HCT 09.6 04.5 - 40.9 %   MCV 83.9 78.0 - 100.0 fL   MCH 27.3 26.0 - 34.0 pg   MCHC 32.5 30.0 - 36.0 g/dL   RDW 81.1 91.4 - 78.2 %   Platelets 195 150 - 400 K/uL   Neutrophils Relative % 53 %   Neutro Abs 5.0 1.7 - 7.7 K/uL   Lymphocytes Relative 34 %   Lymphs Abs 3.3 0.7 - 4.0 K/uL   Monocytes Relative 10 %   Monocytes Absolute 1.0 0.1 - 1.0 K/uL   Eosinophils Relative 3 %   Eosinophils Absolute 0.3 0.0 - 0.7  K/uL   Basophils Relative 0 %   Basophils Absolute 0.0 0.0 - 0.1 K/uL   Ct Abdomen Pelvis W Contrast  Result Date: 06/20/2017 CLINICAL DATA:  Acute onset of umbilical bleeding. EXAM: CT ABDOMEN AND PELVIS WITH CONTRAST TECHNIQUE: Multidetector CT imaging of the abdomen and pelvis was performed using the standard protocol following bolus administration of intravenous contrast. CONTRAST:  ISOVUE-300 IOPAMIDOL (ISOVUE-300) INJECTION 61% COMPARISON:  CT of the abdomen and pelvis from 06/07/2010 FINDINGS: Lower chest: Minimal bibasilar atelectasis is noted. The visualized portions of the mediastinum are unremarkable. Hepatobiliary: The liver is unremarkable in appearance. The patient is status post cholecystectomy, with clips noted at the gallbladder fossa. The common bile duct remains normal in caliber. Pancreas: The pancreas is within normal limits. A 1.3 cm peripancreatic node is stable from 2012. Spleen: The spleen is unremarkable in appearance. Adrenals/Urinary Tract: The adrenal glands are unremarkable in appearance. Mild nonspecific perinephric stranding is noted  bilaterally. A small right renal cyst is noted. There is no evidence of hydronephrosis. No renal or ureteral stones are seen. Stomach/Bowel: The stomach is unremarkable in appearance. The small bowel is within normal limits. The appendix is normal in caliber, without evidence of appendicitis. The colon is unremarkable in appearance. Vascular/Lymphatic: The abdominal aorta is unremarkable in appearance. The inferior vena cava is grossly unremarkable. No retroperitoneal lymphadenopathy is seen. No pelvic sidewall lymphadenopathy is identified. Reproductive: The bladder is mildly distended and grossly unremarkable the uterus is unremarkable in appearance. The ovaries are relatively symmetric. No suspicious adnexal masses are seen. Other: A small to moderate periumbilical hernia is noted, containing only fat. This is new from 2012. Minimal  inflammation is noted along the inferior edge of the hernia, with tiny high-density foci possibly reflecting debris. Musculoskeletal: No acute osseous abnormalities are identified. Mild vacuum phenomenon is noted along the lower lumbar spine. The visualized musculature is unremarkable in appearance. IMPRESSION: 1. Small to moderate periumbilical hernia, containing only fat. This is new from 2012. Minimal inflammation along the inferior edge of the hernia, with tiny high-density foci possibly reflecting debris. 2. Small right renal cyst noted. Electronically Signed   By: Roanna Raider M.D.   On: 06/20/2017 00:13      Gilda Crease, MD 06/20/17 [phone]

## 2017-06-22 ENCOUNTER — Telehealth: Payer: Self-pay | Admitting: Family Medicine

## 2017-06-22 ENCOUNTER — Encounter: Payer: Self-pay | Admitting: Family Medicine

## 2017-06-22 NOTE — Telephone Encounter (Signed)
Please advise 

## 2017-06-22 NOTE — Telephone Encounter (Signed)
Excuse complete, called and notified patient.

## 2017-06-22 NOTE — Telephone Encounter (Signed)
Please give work note for dates indicated.

## 2017-06-22 NOTE — Telephone Encounter (Signed)
Patient had to go to the ER on 06/19/17 due to umbilical bleeding again.  She said she has an appointment with the surgeon on Wednesday of this week.  She wants to know if we can give her a work excuse to cover her for 5/18, 5/19, and 06/24/17.

## 2017-06-23 DIAGNOSIS — Z029 Encounter for administrative examinations, unspecified: Secondary | ICD-10-CM

## 2017-06-24 ENCOUNTER — Ambulatory Visit: Payer: Self-pay | Admitting: General Surgery

## 2017-06-24 ENCOUNTER — Telehealth: Payer: Self-pay | Admitting: Family Medicine

## 2017-06-24 DIAGNOSIS — K429 Umbilical hernia without obstruction or gangrene: Secondary | ICD-10-CM | POA: Diagnosis not present

## 2017-06-24 NOTE — H&P (Signed)
History of Present Illness Axel Filler MD; 06/24/2017 3:59 PM) The patient is a 57 year old female who presents with an umbilical hernia. Referred by: Dr. Lilyan Punt Chief Complaint: Bleeding umbilical hernia  Patient is a 57 year old female he states his last several months she's had some bleeding from her umbilical hernia. She states that it has soaked her garments as well as pooled on the floor. She states that this is random. She currently has to use a pad to help soak the blood and prevent it from so close. Patient underwent CT scan which revealed umbilical hernia. I did review this personally.  She's had a previous lap Cholecystectomy    Past Surgical History Judithann Sauger, [place]; 06/24/2017 3:44 PM) Gallbladder Surgery - Laparoscopic  Gallbladder Surgery - Open   Diagnostic Studies History Judithann Sauger, [place]; 06/24/2017 3:44 PM) Colonoscopy  never Mammogram  1-3 years ago Pap Smear  1-5 years ago  Allergies Judithann Sauger, RMA; 06/24/2017 3:46 PM) Benadryl Allergy *ANTIHISTAMINES*  Allergies Reconciled   Medication History Judithann Sauger, RMA; 06/24/2017 3:47 PM) Allopurinol (  Tablet, Oral) Active. Levothyroxine Sodium ( Tablet, Oral) Active. Mupirocin (2% Ointment, External) Active. Naproxen (  Tablet, Oral) Active. Potassium Chloride Crys ER ( Tablet ER, Oral) Active. Pravastatin Sodium (  Tablet, Oral) Active. Sulfamethoxazole-Trimethoprim (800-160MG  Tablet, Oral) Active. Torsemide (  Tablet, Oral) Active. Trulicity (1.5MG /0.5ML Soln Pen-inj, Subcutaneous) Active. Medications Reconciled  Social History Judithann Sauger, [place]; 06/24/2017 3:44 PM) Caffeine use  Coffee. No drug use  Tobacco use  Former smoker.  Family History Judithann Sauger, [place]; 06/24/2017 3:44 PM) Arthritis  Father, Mother. Breast Cancer  Mother. Depression  Daughter, Mother, Sister. Diabetes Mellitus  Brother, Sister. Heart Disease  Brother,  Father. Heart disease in female family member before age 78  Heart disease in female family member before age 38  Respiratory Condition  Father, Mother. Thyroid problems  Mother.  Pregnancy / Birth History Judithann Sauger, [place]; 06/24/2017 3:44 PM) Age at menarche  14 years. Age of menopause  51-55 Gravida  1 Maternal age  4-20 Para  1  Other Problems Judithann Sauger, [place]; 06/24/2017 3:44 PM) Anxiety Disorder  Arthritis  High blood pressure  Sleep Apnea  Thyroid Disease     Review of Systems Axel Filler MD; 06/24/2017 3:57 PM) General Not Present- Appetite Loss, Chills, Fatigue, Fever, Night Sweats, Weight Gain and Weight Loss. Skin Not Present- Change in Wart/Mole, Dryness, Hives, Jaundice, New Lesions, Non-Healing Wounds, Rash and Ulcer. HEENT Not Present- Earache, Hearing Loss, Hoarseness, Nose Bleed, Oral Ulcers, Ringing in the Ears, Seasonal Allergies, Sinus Pain, Sore Throat, Visual Disturbances, Wears glasses/contact lenses and Yellow Eyes. Respiratory Not Present- Bloody sputum, Chronic Cough, Difficulty Breathing, Snoring and Wheezing. Breast Not Present- Breast Mass, Breast Pain, Nipple Discharge and Skin Changes. Cardiovascular Not Present- Chest Pain, Difficulty Breathing Lying Down, Leg Cramps, Palpitations, Rapid Heart Rate, Shortness of Breath and Swelling of Extremities. Gastrointestinal Not Present- Abdominal Pain, Bloating, Bloody Stool, Change in Bowel Habits, Chronic diarrhea, Constipation, Difficulty Swallowing, Excessive gas, Gets full quickly at meals, Hemorrhoids, Indigestion, Nausea, Rectal Pain and Vomiting. Female Genitourinary Not Present- Frequency, Nocturia, Painful Urination, Pelvic Pain and Urgency. Musculoskeletal Not Present- Back Pain, Joint Pain, Joint Stiffness, Muscle Pain, Muscle Weakness and Swelling of Extremities. Neurological Not Present- Decreased Memory, Fainting, Headaches, Numbness, Seizures, Tingling, Tremor, Trouble walking  and Weakness. Psychiatric Not Present- Anxiety, Bipolar, Change in Sleep Pattern, Depression, Fearful and Frequent crying. Endocrine Not Present- Cold Intolerance, Excessive Hunger, Hair Changes, Heat Intolerance, Hot flashes and New  Diabetes. Hematology Not Present- Blood Thinners, Easy Bruising, Excessive bleeding, Gland problems, HIV and Persistent Infections. All other systems negative  Vitals Judithann Sauger RMA; 06/24/2017 3:45 PM) 06/24/2017 3:44 PM Weight: 324.4 lb Height: 65.5in Body Surface Area: 2.44 m Body Mass Index: 53.16 kg/m  Temp.: 98.58F  Pulse: 94 (Regular)  BP: 150/90 (Sitting, Left Arm, Standard)       Physical Exam Axel Filler MD; 06/24/2017 3:59 PM) The physical exam findings are as follows: Note:Constitutional: No acute distress, conversant, appears stated age  Eyes: Anicteric sclerae, moist conjunctiva, no lid lag  Neck: No thyromegaly, trachea midline, no cervical lymphadenopathy  Lungs: Clear to auscultation biilaterally, normal respiratory effot  Cardiovascular: regular rate & rhythm, no murmurs, no peripheal edema, pedal pulses 2+  GI: Soft, no masses or hepatosplenomegaly, non-tender to palpation  MSK: Normal gait, no clubbing cyanosis, edema  Skin: No rashes, palpation reveals normal skin turgor  Psychiatric: Appropriate judgment and insight, oriented to person, place, and time  Abdomen Inspection Hernias - Umbilical hernia - Reducible(At the umbilicus Conder small area of ulceration inferior lip of the umbilicus. This is due to the umbilical hernia rubbing on this area. This is likely the source of bleeding. There appears to be some inflammation, possible infection in the umbilicus.).    Assessment & Plan Axel Filler MD; 06/24/2017 4:00 PM) UMBILICAL HERNIA WITHOUT OBSTRUCTION AND WITHOUT GANGRENE (K42.9) Impression: 57 year old female with a bleeding umbilical hernia. Ulceration in the inferior umbilical skin.  1.  The patient will like to proceed to the operating room for open umbilical hernia repair with mesh.  2. I discussed with the patient the signs and symptoms of incarceration and strangulation and the need to proceed to the ER should they occur.  3. I discussed with the patient the risks and benefits of the procedure to include but not limited to: Infection, bleeding, damage to surrounding structures, possible need for further surgery, possible nerve pain, and possible recurrence. The patient was understanding and wishes to proceed.

## 2017-06-24 NOTE — H&P (View-Only) (Signed)
History of Present Illness Axel Filler MD; 06/24/2017 3:59 PM) The patient is a 57 year old female who presents with an umbilical hernia. Referred by: Dr. Lilyan Punt Chief Complaint: Bleeding umbilical hernia  Patient is a 57 year old female he states his last several months she's had some bleeding from her umbilical hernia. She states that it has soaked her garments as well as pooled on the floor. She states that this is random. She currently has to use a pad to help soak the blood and prevent it from so close. Patient underwent CT scan which revealed umbilical hernia. I did review this personally.  She's had a previous lap Cholecystectomy    Past Surgical History Judithann Sauger, [place]; 06/24/2017 3:44 PM) Gallbladder Surgery - Laparoscopic  Gallbladder Surgery - Open   Diagnostic Studies History Judithann Sauger, [place]; 06/24/2017 3:44 PM) Colonoscopy  never Mammogram  1-3 years ago Pap Smear  1-5 years ago  Allergies Judithann Sauger, RMA; 06/24/2017 3:46 PM) Benadryl Allergy *ANTIHISTAMINES*  Allergies Reconciled   Medication History Judithann Sauger, RMA; 06/24/2017 3:47 PM) Allopurinol (  Tablet, Oral) Active. Levothyroxine Sodium ( Tablet, Oral) Active. Mupirocin (2% Ointment, External) Active. Naproxen (  Tablet, Oral) Active. Potassium Chloride Crys ER ( Tablet ER, Oral) Active. Pravastatin Sodium (  Tablet, Oral) Active. Sulfamethoxazole-Trimethoprim (800-160MG  Tablet, Oral) Active. Torsemide (  Tablet, Oral) Active. Trulicity (1.5MG /0.5ML Soln Pen-inj, Subcutaneous) Active. Medications Reconciled  Social History Judithann Sauger, [place]; 06/24/2017 3:44 PM) Caffeine use  Coffee. No drug use  Tobacco use  Former smoker.  Family History Judithann Sauger, [place]; 06/24/2017 3:44 PM) Arthritis  Father, Mother. Breast Cancer  Mother. Depression  Daughter, Mother, Sister. Diabetes Mellitus  Brother, Sister. Heart Disease  Brother,  Father. Heart disease in female family member before age 78  Heart disease in female family member before age 38  Respiratory Condition  Father, Mother. Thyroid problems  Mother.  Pregnancy / Birth History Judithann Sauger, [place]; 06/24/2017 3:44 PM) Age at menarche  14 years. Age of menopause  51-55 Gravida  1 Maternal age  4-20 Para  1  Other Problems Judithann Sauger, [place]; 06/24/2017 3:44 PM) Anxiety Disorder  Arthritis  High blood pressure  Sleep Apnea  Thyroid Disease     Review of Systems Axel Filler MD; 06/24/2017 3:57 PM) General Not Present- Appetite Loss, Chills, Fatigue, Fever, Night Sweats, Weight Gain and Weight Loss. Skin Not Present- Change in Wart/Mole, Dryness, Hives, Jaundice, New Lesions, Non-Healing Wounds, Rash and Ulcer. HEENT Not Present- Earache, Hearing Loss, Hoarseness, Nose Bleed, Oral Ulcers, Ringing in the Ears, Seasonal Allergies, Sinus Pain, Sore Throat, Visual Disturbances, Wears glasses/contact lenses and Yellow Eyes. Respiratory Not Present- Bloody sputum, Chronic Cough, Difficulty Breathing, Snoring and Wheezing. Breast Not Present- Breast Mass, Breast Pain, Nipple Discharge and Skin Changes. Cardiovascular Not Present- Chest Pain, Difficulty Breathing Lying Down, Leg Cramps, Palpitations, Rapid Heart Rate, Shortness of Breath and Swelling of Extremities. Gastrointestinal Not Present- Abdominal Pain, Bloating, Bloody Stool, Change in Bowel Habits, Chronic diarrhea, Constipation, Difficulty Swallowing, Excessive gas, Gets full quickly at meals, Hemorrhoids, Indigestion, Nausea, Rectal Pain and Vomiting. Female Genitourinary Not Present- Frequency, Nocturia, Painful Urination, Pelvic Pain and Urgency. Musculoskeletal Not Present- Back Pain, Joint Pain, Joint Stiffness, Muscle Pain, Muscle Weakness and Swelling of Extremities. Neurological Not Present- Decreased Memory, Fainting, Headaches, Numbness, Seizures, Tingling, Tremor, Trouble walking  and Weakness. Psychiatric Not Present- Anxiety, Bipolar, Change in Sleep Pattern, Depression, Fearful and Frequent crying. Endocrine Not Present- Cold Intolerance, Excessive Hunger, Hair Changes, Heat Intolerance, Hot flashes and New  Diabetes. Hematology Not Present- Blood Thinners, Easy Bruising, Excessive bleeding, Gland problems, HIV and Persistent Infections. All other systems negative  Vitals (Patricia King RMA; 06/24/2017 3:45 PM) 06/24/2017 3:44 PM Weight: 324.4 lb Height: 65.5in Body Surface Area: 2.44 m Body Mass Index: 53.16 kg/m  Temp.: 98.5F  Pulse: 94 (Regular)  BP: 150/90 (Sitting, Left Arm, Standard)       Physical Exam (Analie Katzman MD; 06/24/2017 3:59 PM) The physical exam findings are as follows: Note:Constitutional: No acute distress, conversant, appears stated age  Eyes: Anicteric sclerae, moist conjunctiva, no lid lag  Neck: No thyromegaly, trachea midline, no cervical lymphadenopathy  Lungs: Clear to auscultation biilaterally, normal respiratory effot  Cardiovascular: regular rate & rhythm, no murmurs, no peripheal edema, pedal pulses 2+  GI: Soft, no masses or hepatosplenomegaly, non-tender to palpation  MSK: Normal gait, no clubbing cyanosis, edema  Skin: No rashes, palpation reveals normal skin turgor  Psychiatric: Appropriate judgment and insight, oriented to person, place, and time  Abdomen Inspection Hernias - Umbilical hernia - Reducible(At the umbilicus Conder small area of ulceration inferior lip of the umbilicus. This is due to the umbilical hernia rubbing on this area. This is likely the source of bleeding. There appears to be some inflammation, possible infection in the umbilicus.).    Assessment & Plan (Farheen Pfahler MD; 06/24/2017 4:00 PM) UMBILICAL HERNIA WITHOUT OBSTRUCTION AND WITHOUT GANGRENE (K42.9) Impression: 56-year-old female with a bleeding umbilical hernia. Ulceration in the inferior umbilical skin.  1.  The patient will like to proceed to the operating room for open umbilical hernia repair with mesh.  2. I discussed with the patient the signs and symptoms of incarceration and strangulation and the need to proceed to the ER should they occur.  3. I discussed with the patient the risks and benefits of the procedure to include but not limited to: Infection, bleeding, damage to surrounding structures, possible need for further surgery, possible nerve pain, and possible recurrence. The patient was understanding and wishes to proceed. 

## 2017-06-24 NOTE — Telephone Encounter (Signed)
Message for [name]--Patient dropped off FMLA to be fill out. Done what I could please review and fill in highlighted areas where flags are in your box.

## 2017-07-01 ENCOUNTER — Telehealth: Payer: Self-pay | Admitting: Family Medicine

## 2017-07-01 NOTE — Telephone Encounter (Signed)
Patient said that her gout is flaring up and she would like her gout medication called in.  She said she cannot remember the name, only that it starts with a C.   [facility] [place]

## 2017-07-01 NOTE — Telephone Encounter (Signed)
Pt scheduled appt for tomorrow.

## 2017-07-01 NOTE — Telephone Encounter (Signed)
Called pt to get more info. She states she usually has gout in her foot or leg but the pain she is having is in her knee. Having some swelling on the front of knee. Cannot take anything with aspirin because she is having hernia surgery on Tuesday. Pt advised she needs to be seen since this is a new issue.

## 2017-07-02 ENCOUNTER — Encounter: Payer: Self-pay | Admitting: Nurse Practitioner

## 2017-07-02 ENCOUNTER — Ambulatory Visit: Payer: 59 | Admitting: Nurse Practitioner

## 2017-07-02 VITALS — BP 110/72 | Ht 67.0 in | Wt 317.0 lb

## 2017-07-02 DIAGNOSIS — M25562 Pain in left knee: Secondary | ICD-10-CM

## 2017-07-02 DIAGNOSIS — M10062 Idiopathic gout, left knee: Secondary | ICD-10-CM

## 2017-07-02 MED ORDER — HYDROCODONE-ACETAMINOPHEN 10-325 MG PO TABS: 1.0000 | ORAL_TABLET | ORAL | 0 refills | Status: DC | PRN

## 2017-07-02 NOTE — Patient Instructions (Addendum)
LANNETTE AVELLINO  07/02/2017   Your procedure is scheduled on: 07-07-17  Report to [facility] Main  Entrance  Report to admitting at      0530 AM    Call this number if you have problems the morning of surgery [phone]   Remember: Do not eat food or drink liquids :After Midnight.     Take these medicines the morning of surgery with A SIP OF WATER: allopurinol, metoprolol, inhaler and bring DO NOT TAKE ANY DIABETIC MEDICATIONS DAY OF YOUR SURGERY                               You may not have any metal on your body including hair pins and              piercings  Do not wear jewelry, make-up, lotions, powders or perfumes, deodorant             Do not wear nail polish.  Do not shave  48 hours prior to surgery.     Do not bring valuables to the hospital. [place] [facility] [facility] IS NOT             RESPONSIBLE   FOR VALUABLES.  Contacts, dentures or bridgework may not be worn into surgery.      Patients discharged the day of surgery will not be allowed to drive home.  Name and phone number of your driver:  Special Instructions: N/A              Please read over the following fact sheets you were given: _____________________________________________________________________           [facility] - Preparing for Surgery Before surgery, you can play an important role.  Because skin is not sterile, your skin needs to be as free of germs as possible.  You can reduce the number of germs on your skin by washing with CHG (chlorahexidine gluconate) soap before surgery.  CHG is an antiseptic cleaner which kills germs and bonds with the skin to continue killing germs even after washing. Please DO NOT use if you have an allergy to CHG or antibacterial soaps.  If your skin becomes reddened/irritated stop using the CHG and inform your nurse when you arrive at Short Stay. Do not shave (including legs and underarms) for at least 48 hours prior to the first CHG shower.  You may shave your  face/neck. Please follow these instructions carefully:  1.  Shower with CHG Soap the night before surgery and the  morning of Surgery.  2.  If you choose to wash your hair, wash your hair first as usual with your  normal  shampoo.  3.  After you shampoo, rinse your hair and body thoroughly to remove the  shampoo.                           4.  Use CHG as you would any other liquid soap.  You can apply chg directly  to the skin and wash                       Gently with a scrungie or clean washcloth.  5.  Apply the CHG Soap to your body ONLY FROM THE NECK DOWN.   Do not use on face/  open                           Wound or open sores. Avoid contact with eyes, ears mouth and genitals (private parts).                       Wash face,  Genitals (private parts) with your normal soap.             6.  Wash thoroughly, paying special attention to the area where your surgery  will be performed.  7.  Thoroughly rinse your body with warm water from the neck down.  8.  DO NOT shower/wash with your normal soap after using and rinsing off  the CHG Soap.                9.  Pat yourself dry with a clean towel.            10.  Wear clean pajamas.            11.  Place clean sheets on your bed the night of your first shower and do not  sleep with pets. Day of Surgery : Do not apply any lotions/deodorants the morning of surgery.  Please wear clean clothes to the hospital/surgery center.  FAILURE TO FOLLOW THESE INSTRUCTIONS MAY RESULT IN THE CANCELLATION OF YOUR SURGERY PATIENT SIGNATURE_________________________________  NURSE SIGNATURE__________________________________  ________________________________________________________________________

## 2017-07-02 NOTE — Patient Instructions (Signed)
Lidocaine patch

## 2017-07-03 ENCOUNTER — Other Ambulatory Visit: Payer: Self-pay | Admitting: *Deleted

## 2017-07-03 ENCOUNTER — Encounter (HOSPITAL_COMMUNITY): Payer: Self-pay

## 2017-07-03 ENCOUNTER — Encounter: Payer: Self-pay | Admitting: Nurse Practitioner

## 2017-07-03 ENCOUNTER — Encounter (HOSPITAL_COMMUNITY)
Admission: RE | Admit: 2017-07-03 | Discharge: 2017-07-03 | Disposition: A | Discharge status: Home / Self Care | Payer: 59 | Source: Ambulatory Visit | Attending: General Surgery | Admitting: General Surgery

## 2017-07-03 ENCOUNTER — Other Ambulatory Visit: Payer: Self-pay

## 2017-07-03 DIAGNOSIS — Z01810 Encounter for preprocedural cardiovascular examination: Secondary | ICD-10-CM | POA: Insufficient documentation

## 2017-07-03 HISTORY — DX: Family history of other specified conditions: Z84.89

## 2017-07-03 HISTORY — DX: Nausea with vomiting, unspecified: Z98.890

## 2017-07-03 HISTORY — DX: Other complications of anesthesia, initial encounter: T88.59XA

## 2017-07-03 HISTORY — DX: Depression, unspecified: F32.A

## 2017-07-03 HISTORY — DX: Major depressive disorder, single episode, unspecified: F32.9

## 2017-07-03 HISTORY — DX: Anxiety disorder, unspecified: F41.9

## 2017-07-03 HISTORY — DX: Hypothyroidism, unspecified: E03.9

## 2017-07-03 HISTORY — DX: Other specified postprocedural states: R11.2

## 2017-07-03 HISTORY — DX: Adverse effect of unspecified anesthetic, initial encounter: T41.45XA

## 2017-07-03 HISTORY — DX: Unspecified osteoarthritis, unspecified site: M19.90

## 2017-07-03 LAB — GLUCOSE, CAPILLARY: GLUCOSE-CAPILLARY: 106 mg/dL — AB (ref 65–99)

## 2017-07-03 LAB — URIC ACID: Uric Acid: 7.5 mg/dL — ABNORMAL HIGH (ref 2.5–7.1)

## 2017-07-03 MED ORDER — COLCHICINE 0.6 MG PO TABS: 0.6000 mg | ORAL_TABLET | Freq: Every day | ORAL | 0 refills | Status: DC | PRN

## 2017-07-03 MED FILL — COLCHICINE 0.6 MG TABS: 0.6 | 90 days supply | Qty: 90 | Fill #2

## 2017-07-03 NOTE — Progress Notes (Signed)
Cbcw/diff and cmp 06-19-17 epic

## 2017-07-03 NOTE — Progress Notes (Signed)
Subjective: Presents for complaints of left knee pain for the past 4 days.  No specific history of injury.  States the knee has been extremely tender even to the lightest touch.  Has had her family help her get out of bed and to move the leg.  Has been able to get around better today.  Currently on diclofenac 75 mg twice daily.  Has also tried a knee brace and topical analgesic.  States even the light touch of the spray caused pain. No fever. No other joint involvement. Is on Allopurinol for gout. Has not had a flare up in a long time. Cannot take aspirin or Prednisone due to upcoming surgery next week.   Objective:   BP 110/72   Ht  (1.702 m)   Wt (!) 317 lb (143.8 kg)   BMI 49.65 kg/m  NAD. Alert, oriented. Mild edema and faint pink color to the left knee. Moderately tender to palpation. Joint stable. Moderate tenderness with flexion and extension. Exam done in chair. Unable to get onto exam table. Mild limp with walking.   Assessment:  Acute pain of left knee  Acute idiopathic gout of left knee - Plan: Uric acid    Plan:   Meds ordered this encounter  Medications  . HYDROcodone-acetaminophen (NORCO) 10-325 MG tablet    Sig: Take 1 tablet by mouth every 4 (four) hours as needed.    Dispense:  15 tablet    Refill:  0    Order Specific Question:   Supervising Provider    Answer:   Merlyn Albert [[id number]]   Drowsiness precautions with pain med. Continue Diclofenac as directed. Uric acid level pending.  Further follow up based on lab.

## 2017-07-06 ENCOUNTER — Telehealth: Payer: Self-pay | Admitting: Family Medicine

## 2017-07-06 MED ORDER — DEXTROSE 5 % IV SOLN
3.0000 g | INTRAVENOUS | Status: AC
  Administered 2017-07-07: 3 g via INTRAVENOUS
  Filled 2017-07-06: qty 3

## 2017-07-06 MED FILL — TRULICITY 1.5 MG/0.5 ML PEN: 1.5 | 28 days supply | Qty: 2 | Fill #5

## 2017-07-06 NOTE — Telephone Encounter (Signed)
Please see Alcario DroughtErica note.

## 2017-07-06 NOTE — Telephone Encounter (Signed)
Message for Amy JonesCarolyn-- [facility] needing more information on patient please fill in highlighted areas.Please date,sign so I can fax back in your box.

## 2017-07-07 ENCOUNTER — Ambulatory Visit (HOSPITAL_COMMUNITY): Payer: 59 | Admitting: Certified Registered Nurse Anesthetist

## 2017-07-07 ENCOUNTER — Encounter (HOSPITAL_COMMUNITY)
Admission: RE | Disposition: A | Discharge status: Home / Self Care | Payer: Self-pay | Source: Ambulatory Visit | Attending: General Surgery

## 2017-07-07 ENCOUNTER — Encounter (HOSPITAL_COMMUNITY): Payer: Self-pay | Admitting: Emergency Medicine

## 2017-07-07 ENCOUNTER — Ambulatory Visit (HOSPITAL_COMMUNITY)
Admission: RE | Admit: 2017-07-07 | Discharge: 2017-07-07 | Disposition: A | Discharge status: Home / Self Care | Payer: 59 | Source: Ambulatory Visit | Attending: General Surgery | Admitting: General Surgery

## 2017-07-07 DIAGNOSIS — I10 Essential (primary) hypertension: Secondary | ICD-10-CM | POA: Diagnosis not present

## 2017-07-07 DIAGNOSIS — G4730 Sleep apnea, unspecified: Secondary | ICD-10-CM | POA: Diagnosis not present

## 2017-07-07 DIAGNOSIS — Z6841 Body Mass Index (BMI) 40.0 and over, adult: Secondary | ICD-10-CM | POA: Diagnosis not present

## 2017-07-07 DIAGNOSIS — I872 Venous insufficiency (chronic) (peripheral): Secondary | ICD-10-CM | POA: Diagnosis not present

## 2017-07-07 DIAGNOSIS — Z794 Long term (current) use of insulin: Secondary | ICD-10-CM | POA: Insufficient documentation

## 2017-07-07 DIAGNOSIS — Z87891 Personal history of nicotine dependence: Secondary | ICD-10-CM | POA: Diagnosis not present

## 2017-07-07 DIAGNOSIS — K420 Umbilical hernia with obstruction, without gangrene: Secondary | ICD-10-CM | POA: Insufficient documentation

## 2017-07-07 DIAGNOSIS — Z79890 Hormone replacement therapy (postmenopausal): Secondary | ICD-10-CM | POA: Diagnosis not present

## 2017-07-07 DIAGNOSIS — Z79899 Other long term (current) drug therapy: Secondary | ICD-10-CM | POA: Diagnosis not present

## 2017-07-07 DIAGNOSIS — Z791 Long term (current) use of non-steroidal anti-inflammatories (NSAID): Secondary | ICD-10-CM | POA: Insufficient documentation

## 2017-07-07 DIAGNOSIS — E039 Hypothyroidism, unspecified: Secondary | ICD-10-CM | POA: Insufficient documentation

## 2017-07-07 DIAGNOSIS — E119 Type 2 diabetes mellitus without complications: Secondary | ICD-10-CM | POA: Diagnosis not present

## 2017-07-07 DIAGNOSIS — K429 Umbilical hernia without obstruction or gangrene: Secondary | ICD-10-CM | POA: Diagnosis not present

## 2017-07-07 HISTORY — PX: UMBILICAL HERNIA REPAIR: SHX196

## 2017-07-07 LAB — GLUCOSE, CAPILLARY
Glucose-Capillary: 119 mg/dL — ABNORMAL HIGH (ref 65–99)
Glucose-Capillary: 122 mg/dL — ABNORMAL HIGH (ref 65–99)

## 2017-07-07 SURGERY — REPAIR, HERNIA, UMBILICAL, ADULT
Anesthesia: General

## 2017-07-07 MED ORDER — FENTANYL CITRATE (PF) 100 MCG/2ML IJ SOLN
INTRAMUSCULAR | Status: DC | PRN
  Administered 2017-07-07: 100 ug via INTRAVENOUS
  Administered 2017-07-07 (×2): 50 ug via INTRAVENOUS

## 2017-07-07 MED ORDER — GABAPENTIN 300 MG PO CAPS
300.0000 mg | ORAL_CAPSULE | ORAL | Status: AC
  Administered 2017-07-07: 300 mg via ORAL
  Filled 2017-07-07: qty 1

## 2017-07-07 MED ORDER — LACTATED RINGERS IV SOLN
INTRAVENOUS | Status: DC | PRN
  Administered 2017-07-07: 07:00:00 via INTRAVENOUS

## 2017-07-07 MED ORDER — DEXAMETHASONE SODIUM PHOSPHATE 10 MG/ML IJ SOLN
INTRAMUSCULAR | Status: DC | PRN
  Administered 2017-07-07: 10 mg via INTRAVENOUS

## 2017-07-07 MED ORDER — ACETAMINOPHEN 500 MG PO TABS
1000.0000 mg | ORAL_TABLET | ORAL | Status: AC
  Administered 2017-07-07: 1000 mg via ORAL
  Filled 2017-07-07: qty 2

## 2017-07-07 MED ORDER — MEPERIDINE HCL 50 MG/ML IJ SOLN: 6.2500 mg | INTRAMUSCULAR | Status: DC | PRN

## 2017-07-07 MED ORDER — MIDAZOLAM HCL 2 MG/2ML IJ SOLN
INTRAMUSCULAR | Status: AC
  Filled 2017-07-07: qty 2

## 2017-07-07 MED ORDER — OXYCODONE-ACETAMINOPHEN 5-325 MG PO TABS: 1.0000 | ORAL_TABLET | ORAL | 0 refills | Status: DC | PRN

## 2017-07-07 MED ORDER — OXYCODONE-ACETAMINOPHEN 5-325 MG PO TABS
1.0000 | ORAL_TABLET | Freq: Once | ORAL | Status: AC | PRN
  Administered 2017-07-07: 1 via ORAL

## 2017-07-07 MED ORDER — FENTANYL CITRATE (PF) 100 MCG/2ML IJ SOLN
INTRAMUSCULAR | Status: AC
  Filled 2017-07-07: qty 2

## 2017-07-07 MED ORDER — PROPOFOL 10 MG/ML IV BOLUS
INTRAVENOUS | Status: AC
  Filled 2017-07-07: qty 20

## 2017-07-07 MED ORDER — BUPIVACAINE-EPINEPHRINE 0.25% -1:200000 IJ SOLN
INTRAMUSCULAR | Status: DC | PRN
  Administered 2017-07-07: 30 mL

## 2017-07-07 MED ORDER — OXYCODONE-ACETAMINOPHEN 5-325 MG PO TABS
ORAL_TABLET | ORAL | Status: AC
  Filled 2017-07-07: qty 1

## 2017-07-07 MED ORDER — CHLORHEXIDINE GLUCONATE CLOTH 2 % EX PADS: 6.0000 | MEDICATED_PAD | Freq: Once | CUTANEOUS | Status: DC

## 2017-07-07 MED ORDER — 0.9 % SODIUM CHLORIDE (POUR BTL) OPTIME
TOPICAL | Status: DC | PRN
  Administered 2017-07-07: 1000 mL

## 2017-07-07 MED ORDER — ONDANSETRON HCL 4 MG/2ML IJ SOLN
INTRAMUSCULAR | Status: DC | PRN
  Administered 2017-07-07: 4 mg via INTRAVENOUS

## 2017-07-07 MED ORDER — SUGAMMADEX SODIUM 500 MG/5ML IV SOLN
INTRAVENOUS | Status: AC
  Filled 2017-07-07: qty 5

## 2017-07-07 MED ORDER — PROPOFOL 10 MG/ML IV BOLUS
INTRAVENOUS | Status: DC | PRN
  Administered 2017-07-07: 300 mg via INTRAVENOUS

## 2017-07-07 MED ORDER — METOCLOPRAMIDE HCL 5 MG/ML IJ SOLN: 10.0000 mg | Freq: Once | INTRAMUSCULAR | Status: DC | PRN

## 2017-07-07 MED ORDER — SUCCINYLCHOLINE CHLORIDE 200 MG/10ML IV SOSY
PREFILLED_SYRINGE | INTRAVENOUS | Status: AC
  Filled 2017-07-07: qty 10

## 2017-07-07 MED ORDER — LIDOCAINE 2% (20 MG/ML) 5 ML SYRINGE
INTRAMUSCULAR | Status: DC | PRN
  Administered 2017-07-07: 100 mg via INTRAVENOUS

## 2017-07-07 MED ORDER — SCOPOLAMINE 1 MG/3DAYS TD PT72
MEDICATED_PATCH | TRANSDERMAL | Status: AC
  Filled 2017-07-07: qty 1

## 2017-07-07 MED ORDER — MIDAZOLAM HCL 5 MG/5ML IJ SOLN
INTRAMUSCULAR | Status: DC | PRN
  Administered 2017-07-07: 2 mg via INTRAVENOUS

## 2017-07-07 MED ORDER — SCOPOLAMINE 1 MG/3DAYS TD PT72
MEDICATED_PATCH | TRANSDERMAL | Status: DC | PRN
  Administered 2017-07-07: 1 via TRANSDERMAL

## 2017-07-07 MED ORDER — LIDOCAINE 2% (20 MG/ML) 5 ML SYRINGE
INTRAMUSCULAR | Status: AC
  Filled 2017-07-07: qty 5

## 2017-07-07 MED ORDER — LACTATED RINGERS IV SOLN: INTRAVENOUS | Status: DC

## 2017-07-07 MED ORDER — FENTANYL CITRATE (PF) 100 MCG/2ML IJ SOLN
25.0000 ug | INTRAMUSCULAR | Status: DC | PRN
  Administered 2017-07-07 (×2): 25 ug via INTRAVENOUS
  Administered 2017-07-07: 50 ug via INTRAVENOUS

## 2017-07-07 MED ORDER — ONDANSETRON HCL 4 MG/2ML IJ SOLN
INTRAMUSCULAR | Status: AC
  Filled 2017-07-07: qty 2

## 2017-07-07 MED ORDER — SUGAMMADEX SODIUM 200 MG/2ML IV SOLN
INTRAVENOUS | Status: DC | PRN
  Administered 2017-07-07: 400 mg via INTRAVENOUS

## 2017-07-07 MED ORDER — ALBUTEROL SULFATE HFA 108 (90 BASE) MCG/ACT IN AERS
INHALATION_SPRAY | RESPIRATORY_TRACT | Status: DC | PRN
  Administered 2017-07-07: 5 via RESPIRATORY_TRACT

## 2017-07-07 MED ORDER — SUCCINYLCHOLINE CHLORIDE 200 MG/10ML IV SOSY
PREFILLED_SYRINGE | INTRAVENOUS | Status: DC | PRN
  Administered 2017-07-07: 120 mg via INTRAVENOUS

## 2017-07-07 MED ORDER — FENTANYL CITRATE (PF) 100 MCG/2ML IJ SOLN
INTRAMUSCULAR | Status: AC
  Administered 2017-07-07: 25 ug via INTRAVENOUS
  Filled 2017-07-07: qty 2

## 2017-07-07 MED ORDER — ROCURONIUM BROMIDE 50 MG/5ML IV SOSY
PREFILLED_SYRINGE | INTRAVENOUS | Status: DC | PRN
  Administered 2017-07-07: 30 mg via INTRAVENOUS

## 2017-07-07 MED ORDER — ALBUTEROL SULFATE HFA 108 (90 BASE) MCG/ACT IN AERS
INHALATION_SPRAY | RESPIRATORY_TRACT | Status: AC
  Filled 2017-07-07: qty 6.7

## 2017-07-07 MED ORDER — BUPIVACAINE-EPINEPHRINE (PF) 0.25% -1:200000 IJ SOLN
INTRAMUSCULAR | Status: AC
  Filled 2017-07-07: qty 30

## 2017-07-07 SURGICAL SUPPLY — 42 items
BENZOIN TINCTURE PRP APPL 2/3 (GAUZE/BANDAGES/DRESSINGS) ×2 IMPLANT
BINDER ABDOMINAL 12 ML 46-62 (SOFTGOODS) ×2 IMPLANT
BLADE HEX COATED 2.75 (ELECTRODE) ×2 IMPLANT
BLADE SURG 15 STRL LF DISP TIS (BLADE) ×1 IMPLANT
BLADE SURG 15 STRL SS (BLADE) ×1
BLADE SURG SZ10 CARB STEEL (BLADE) ×4 IMPLANT
DECANTER SPIKE VIAL GLASS SM (MISCELLANEOUS) ×2 IMPLANT
DERMABOND ADVANCED (GAUZE/BANDAGES/DRESSINGS) ×1
DERMABOND ADVANCED .7 DNX12 (GAUZE/BANDAGES/DRESSINGS) ×1 IMPLANT
DRAPE LAPAROSCOPIC ABDOMINAL (DRAPES) ×2 IMPLANT
DRSG TEGADERM 4X4.75 (GAUZE/BANDAGES/DRESSINGS) ×2 IMPLANT
ELECT PENCIL ROCKER SW 15FT (MISCELLANEOUS) ×2 IMPLANT
ELECT REM PT RETURN 15FT ADLT (MISCELLANEOUS) ×2 IMPLANT
GAUZE SPONGE 4X4 12PLY STRL (GAUZE/BANDAGES/DRESSINGS) ×2 IMPLANT
GLOVE BIO SURGEON STRL SZ7.5 (GLOVE) ×4 IMPLANT
GLOVE BIOGEL PI IND STRL 7.0 (GLOVE) ×1 IMPLANT
GLOVE BIOGEL PI INDICATOR 7.0 (GLOVE) ×1
GOWN STRL REUS W/ TWL XL LVL3 (GOWN DISPOSABLE) ×1 IMPLANT
GOWN STRL REUS W/TWL LRG LVL3 (GOWN DISPOSABLE) ×2 IMPLANT
GOWN STRL REUS W/TWL XL LVL3 (GOWN DISPOSABLE) ×3 IMPLANT
KIT BASIN OR (CUSTOM PROCEDURE TRAY) ×2 IMPLANT
MESH VENTRALEX ST 2.5 CRC MED (Mesh General) ×2 IMPLANT
NEEDLE HYPO 22GX1.5 SAFETY (NEEDLE) ×2 IMPLANT
NEEDLE HYPO 25X1 1.5 SAFETY (NEEDLE) ×2 IMPLANT
NS IRRIG 1000ML POUR BTL (IV SOLUTION) ×2 IMPLANT
PACK BASIC VI WITH GOWN DISP (CUSTOM PROCEDURE TRAY) IMPLANT
PACK GENERAL/GYN (CUSTOM PROCEDURE TRAY) ×2 IMPLANT
SOL PREP POV-IOD 4OZ 10% (MISCELLANEOUS) ×2 IMPLANT
SPONGE LAP 18X18 RF (DISPOSABLE) ×2 IMPLANT
STRIP CLOSURE SKIN 1/2X4 (GAUZE/BANDAGES/DRESSINGS) ×2 IMPLANT
SUT MNCRL AB 4-0 PS2 18 (SUTURE) ×2 IMPLANT
SUT NOVA NAB DX-16 0-1 5-0 T12 (SUTURE) ×4 IMPLANT
SUT PROLENE 0 CT 1 30 (SUTURE) ×2 IMPLANT
SUT PROLENE 0 CT 1 CR/8 (SUTURE) IMPLANT
SUT VIC AB 2-0 SH 18 (SUTURE) ×4 IMPLANT
SUT VIC AB 2-0 SH 27 (SUTURE) ×2
SUT VIC AB 2-0 SH 27X BRD (SUTURE) ×2 IMPLANT
SYR BULB IRRIGATION 50ML (SYRINGE) ×2 IMPLANT
SYR CONTROL 10ML LL (SYRINGE) ×2 IMPLANT
TOWEL OR 17X26 10 PK STRL BLUE (TOWEL DISPOSABLE) ×2 IMPLANT
WATER STERILE IRR 1000ML POUR (IV SOLUTION) ×2 IMPLANT
YANKAUER SUCT BULB TIP 10FT TU (MISCELLANEOUS) ×2 IMPLANT

## 2017-07-07 NOTE — Anesthesia Postprocedure Evaluation (Signed)
Anesthesia Post Note  Patient: Amy Hunter  Procedure(s) Performed: OPEN UMBILICAL HERNIA REPAIR WITH MESH (N/A )     Patient location during evaluation: PACU Anesthesia Type: General Level of consciousness: awake and alert Pain management: pain level controlled Vital Signs Assessment: post-procedure vital signs reviewed and stable Respiratory status: spontaneous breathing, nonlabored ventilation, respiratory function stable and patient connected to nasal cannula oxygen Cardiovascular status: blood pressure returned to baseline and stable Postop Assessment: no apparent nausea or vomiting Anesthetic complications: no    Last Vitals:  Vitals:   07/07/17 1000 07/07/17 1058  BP: (!) 130/52 140/80  Pulse: 88 80  Resp: 12 18  Temp: 36.6 C 36.6 C  SpO2: 96% 94%    Last Pain:  Vitals:   07/07/17 1000  TempSrc:   PainSc: 5                  Phillips Groutarignan, Taytem Ghattas

## 2017-07-07 NOTE — Transfer of Care (Signed)
Immediate Anesthesia Transfer of Care Note  Patient: Amy FerrySandra W Carthen  Procedure(s) Performed: OPEN UMBILICAL HERNIA REPAIR WITH MESH (N/A )  Patient Location: PACU  Anesthesia Type:General  Level of Consciousness: awake, alert  and oriented  Airway & Oxygen Therapy: Patient Spontanous Breathing and Patient connected to face mask oxygen  Post-op Assessment: Report given to RN and Post -op Vital signs reviewed and stable  Post vital signs: Reviewed and stable  Last Vitals:  Vitals Value Taken Time  BP 140/74 07/07/2017  8:50 AM  Temp    Pulse 97 07/07/2017  8:52 AM  Resp 18 07/07/2017  8:52 AM  SpO2 95 % 07/07/2017  8:52 AM  Vitals shown include unvalidated device data.  Last Pain:  Vitals:   07/07/17 0624  TempSrc:   PainSc: 0-No pain      Patients Stated Pain Goal: 4 (07/07/17 [id number])  Complications: No apparent anesthesia complications

## 2017-07-07 NOTE — Anesthesia Procedure Notes (Signed)
Procedure Name: Intubation Date/Time: 07/07/2017 7:38 AM Performed by: Maxwell Caul, CRNA Pre-anesthesia Checklist: Patient identified, Emergency Drugs available, Suction available and Patient being monitored Patient Re-evaluated:Patient Re-evaluated prior to induction Oxygen Delivery Method: Circle system utilized Preoxygenation: Pre-oxygenation with 100% oxygen Induction Type: IV induction Ventilation: Mask ventilation without difficulty Laryngoscope Size: Mac and 4 Grade View: Grade I Tube type: Oral Tube size: 7.5 mm Number of attempts: 1 Airway Equipment and Method: Stylet Placement Confirmation: ETT inserted through vocal cords under direct vision,  positive ETCO2 and breath sounds checked- equal and bilateral Secured at: 18 cm Tube secured with: Tape Dental Injury: Teeth and Oropharynx as per pre-operative assessment

## 2017-07-07 NOTE — Anesthesia Preprocedure Evaluation (Addendum)
Anesthesia Evaluation  Patient identified by MRN, date of birth, ID band Patient awake    Reviewed: Allergy & Precautions, NPO status , Patient's Chart, lab work & pertinent test results  History of Anesthesia Complications (+) PONV  Airway Mallampati: II  TM Distance: >3 FB Neck ROM: Full    Dental no notable dental hx.    Pulmonary sleep apnea , former smoker,    Pulmonary exam normal breath sounds clear to auscultation       Cardiovascular hypertension, Pt. on medications and Pt. on home beta blockers Normal cardiovascular exam Rhythm:Regular Rate:Normal     Neuro/Psych Anxiety Depression negative neurological ROS     GI/Hepatic negative GI ROS, Neg liver ROS,   Endo/Other  diabetes, Type 2, Insulin DependentHypothyroidism Morbid obesity  Renal/GU negative Renal ROS  negative genitourinary   Musculoskeletal negative musculoskeletal ROS (+)   Abdominal   Peds negative pediatric ROS (+)  Hematology negative hematology ROS (+)   Anesthesia Other Findings   Reproductive/Obstetrics negative OB ROS                            Anesthesia Physical Anesthesia Plan  ASA: III  Anesthesia Plan: General   Post-op Pain Management:    Induction: Intravenous  PONV Risk Score and Plan: 4 or greater and Ondansetron, Midazolam, Scopolamine patch - Pre-op and Treatment may vary due to age or medical condition  Airway Management Planned: Oral ETT  Additional Equipment:   Intra-op Plan:   Post-operative Plan: Extubation in OR  Informed Consent: I have reviewed the patients History and Physical, chart, labs and discussed the procedure including the risks, benefits and alternatives for the proposed anesthesia with the patient or authorized representative who has indicated his/her understanding and acceptance.   Dental advisory given  Plan Discussed with: CRNA  Anesthesia Plan Comments:         Anesthesia Quick Evaluation

## 2017-07-07 NOTE — Interval H&P Note (Signed)
History and Physical Interval Note:  07/07/2017 7:17 AM  Amy FerrySandra W Ahart  has presented today for surgery, with the diagnosis of umbilical hernia  The various methods of treatment have been discussed with the patient and family. After consideration of risks, benefits and other options for treatment, the patient has consented to  Procedure(s): OPEN UMBILICAL HERNIA REPAIR WITH MESH (N/A) as a surgical intervention .  The patient's history has been reviewed, patient examined, no change in status, stable for surgery.  I have reviewed the patient's chart and labs.  Questions were answered to the patient's satisfaction.     Marigene Ehlersamirez Jr., Jed LimerickArmando

## 2017-07-07 NOTE — Op Note (Signed)
07/07/2017  8:40 AM  PATIENT:  Amy Hunter  57 y.o. female  PRE-OPERATIVE DIAGNOSIS:  Incarcerated umbilical hernia  POST-OPERATIVE DIAGNOSIS: incarcerated umbilical hernia  PROCEDURE:  Procedure(s): OPEN UMBILICAL HERNIA REPAIR WITH MESH (N/A)  SURGEON:  Surgeon(s) and Role:    * Axel Filleramirez, Alexsa Flaum, MD - Primary  ANESTHESIA:   local and general  EBL:  minimal   BLOOD ADMINISTERED:none  DRAINS: none   LOCAL MEDICATIONS USED:  BUPIVICAINE   SPECIMEN:  No Specimen  DISPOSITION OF SPECIMEN:  N/A  COUNTS:  YES  TOURNIQUET:  * No tourniquets in log *  DICTATION: .Dragon Dictation Indication for procedure: Patient is a 57 year old female who came in with incarcerated umbilical hernia with bleeding likely secondary to ulcer within the umbilicus.  Secondary to bleeding patient was taking to the operating electively for open hernia repair  Details of procedure: After the patient was consented she was taken back to the operating room placed in the supine position with bilateral SCDs in place.  After appropriate antibiotics were confirmed a timeout was called and all facts verified.  A curvilinear incision was made just inferior to the umbilicus.  Cautery was used to maintain hemostasis and dissection was taken down to the anterior fascia.  It was evident there was a large amount of hernia sac within the subcutaneous space.  The hernia sac was entered with cautery.  There is large amount of incarcerated omentum within the hernia sac.  The entire hernia sac was exposed.  The omentum was then tacked back into the abdominal cavity.  There is no other hernias that were seen or palpated in the fascia.  At this time the hernia sac was resected.  The fascia was then grasped with 2 Kocher clamps.  The subcutaneous tissue was then dissected off the anterior fascia.  At this time a medium size, 6 cm Bard ventral light mesh patch was then placed into the intra-abdominal cavity.  This was brought  up and tacked to the 3:00 and 9:00 edges using a #1 Novafil in interrupted fashion.  The mesh lay flat.  The tails were cut.  At this time the fascial edges were reapproximated using interrupted #1 no refills.   At this time I was able to evaluate the umbilicus.  There is large amount of ulceration to the base of the umbilicus likely due to the incarcerated hernia.  At this time I decided to excise the skin as this potentially could have led to wound healing issues.  At this time the subcutaneous tissue was reapproximated using interrupted 2-0 Vicryl's.  Deep dermal layer was reapproximated using 2-0 interrupted Vicryls.  The skin was reapproximated using 4-0 Monocryl subcuticular fashion.  Subcutaneous skin was infiltrated with quarter percent Marcaine with epi.  The skin was then dressed with Dermabond.  The patient tolerated the procedure well was taken to the recovery room in stable condition.    PLAN OF CARE: Discharge to home after PACU  PATIENT DISPOSITION:  PACU - hemodynamically stable.   Delay start of Pharmacological VTE agent (>24hrs) due to surgical blood loss or risk of bleeding: not applicable

## 2017-07-08 ENCOUNTER — Encounter (HOSPITAL_COMMUNITY): Payer: Self-pay | Admitting: General Surgery

## 2017-07-08 NOTE — Addendum Note (Signed)
Addendum  created 07/08/17 0647 by Elyn PeersAllen, Cheney J, CRNA   Charge Capture section accepted

## 2017-07-09 MED FILL — TORSEMIDE 20 MG TABLET: 20 | 30 days supply | Qty: 180 | Fill #0

## 2017-07-10 NOTE — Telephone Encounter (Signed)
I assume this is been filled out?

## 2017-07-13 NOTE — Telephone Encounter (Signed)
This was done. She does not need continuous leave from us. The surgeon will handle her leave as of 6/4.

## 2017-07-13 NOTE — Telephone Encounter (Signed)
Form was given to the front desk to send back to the company handling her FMLA.

## 2017-07-16 MED FILL — NAPROXEN 500 MG TABLET: 500 | 30 days supply | Qty: 60 | Fill #3

## 2017-07-16 MED FILL — LISINOPRIL 2.5 MG TABLET: 2.5 | 90 days supply | Qty: 90 | Fill #1

## 2017-07-16 MED FILL — METOPROLOL SUCCINATE ER 50: 50 | 90 days supply | Qty: 90 | Fill #0

## 2017-08-05 ENCOUNTER — Other Ambulatory Visit: Payer: Self-pay | Admitting: Family Medicine

## 2017-08-05 MED FILL — TRULICITY 1.5 MG/0.5 ML PEN: 1.5 | 28 days supply | Qty: 2 | Fill #0

## 2017-08-05 MED FILL — ALPRAZolam 1 MG TABS: 1 | 10 days supply | Qty: 30 | Fill #0

## 2017-08-10 MED FILL — TORSEMIDE 20 MG TABLET: 20 | 30 days supply | Qty: 180 | Fill #1

## 2017-09-01 MED FILL — NAPROXEN 500 MG TABLET: 500 | 30 days supply | Qty: 60 | Fill #4

## 2017-09-07 MED FILL — TORSEMIDE 20 MG TABLET: 20 | 30 days supply | Qty: 180 | Fill #2

## 2017-09-09 MED FILL — POTASSIUM CL ER 20 MEQ TAB: 20 | 90 days supply | Qty: 180 | Fill #1

## 2017-09-23 MED FILL — ALLOPURINOL 300 MG TABS: 300 | 90 days supply | Qty: 90 | Fill #1

## 2017-09-23 MED FILL — PRAVASTATIN SODIUM 20 MG TA: 20 | 90 days supply | Qty: 90 | Fill #1

## 2017-09-23 MED FILL — TRULICITY 1.5 MG/0.5 ML PEN: 1.5 | 28 days supply | Qty: 2 | Fill #1

## 2017-09-25 ENCOUNTER — Telehealth: Payer: Self-pay | Admitting: Family Medicine

## 2017-09-25 MED ORDER — CEPHALEXIN 500 MG PO CAPS: 500.0000 mg | ORAL_CAPSULE | Freq: Four times a day (QID) | ORAL | 0 refills | Status: DC

## 2017-09-25 NOTE — Telephone Encounter (Signed)
Left message to return.

## 2017-09-25 NOTE — Telephone Encounter (Signed)
Patient advised Dr Lorin PicketScott recommends Keflex 500 mg 1 4 times daily for 10 days Warm compresses frequently Office visit on Monday Patient verbalized understanding and scheduled follow up office visit for Monday. Prescription sent electronically to pharmacy.

## 2017-09-25 NOTE — Telephone Encounter (Signed)
Possible infected toe, left foot 2nd toe  Draining a little puss, red, & painful to wear a shoe, not warm to the touch  Pt is at work with 3 co-workers already out & she can not leave  Can we call in something or recommend something for her to use OTC?  Pt states she could come in Monday (will need "ok" to fill a same day slot)  Please advise & call pt     [facility]

## 2017-09-25 NOTE — Telephone Encounter (Signed)
I recommend Keflex 500 mg 1 4 times daily for 10 days Warm compresses frequently Office visit on Monday If completely better by Monday she can call and cancel office visit

## 2017-09-28 ENCOUNTER — Ambulatory Visit (INDEPENDENT_AMBULATORY_CARE_PROVIDER_SITE_OTHER): Payer: 59 | Admitting: Family Medicine

## 2017-09-28 ENCOUNTER — Other Ambulatory Visit: Payer: Self-pay | Admitting: *Deleted

## 2017-09-28 ENCOUNTER — Encounter: Payer: Self-pay | Admitting: Family Medicine

## 2017-09-28 VITALS — BP 136/86 | Ht 67.0 in | Wt 326.4 lb

## 2017-09-28 DIAGNOSIS — L03032 Cellulitis of left toe: Secondary | ICD-10-CM

## 2017-09-28 DIAGNOSIS — E119 Type 2 diabetes mellitus without complications: Secondary | ICD-10-CM

## 2017-09-28 DIAGNOSIS — F4321 Adjustment disorder with depressed mood: Secondary | ICD-10-CM

## 2017-09-28 DIAGNOSIS — E7849 Other hyperlipidemia: Secondary | ICD-10-CM

## 2017-09-28 DIAGNOSIS — Z1231 Encounter for screening mammogram for malignant neoplasm of breast: Secondary | ICD-10-CM

## 2017-09-28 NOTE — Progress Notes (Signed)
   Subjective:    Patient ID: Amy Hunter, female    DOB: 07/22/1960, 57 y.o.   MRN: [id number]  HPI Patient arrives for a follow up on left great toe infection- currently on keflex. Patient does have obesity as well as diabetes.  Takes her medicine on a regular basis.  Will be following up for that later this year here today because had some toe related infection with a loose nail from where it hit against a piece of furniture.  Denies any other particular troubles.  PMH benign  Review of Systems  Constitutional: Negative for activity change and appetite change.  HENT: Negative for congestion and rhinorrhea.   Respiratory: Negative for cough and shortness of breath.   Cardiovascular: Negative for chest pain and leg swelling.  Gastrointestinal: Negative for abdominal pain, nausea and vomiting.  Skin: Negative for color change.  Neurological: Negative for dizziness and weakness.  Psychiatric/Behavioral: Negative for agitation and confusion.       Objective:   Physical Exam  Constitutional: She appears well-nourished. No distress.  HENT:  Head: Normocephalic.  Cardiovascular: Normal rate, regular rhythm and normal heart sounds.  No murmur heard. Pulmonary/Chest: Effort normal and breath sounds normal.  Musculoskeletal: She exhibits no edema.  Lymphadenopathy:    She has no cervical adenopathy.  Neurological: She is alert.  Psychiatric: Her behavior is normal.  Vitals reviewed.  On her left foot there is fungal nails the second toe has some soreness from cellulitis she may end up losing that nail       Assessment & Plan:  If she has repetitive infections of this toe and around this nail she may need to see podiatry to have this toenail removed continue Keflex until finished  Healthy diet continue medication follow-up within 6 months  Stress related issues from the loss of her husband not depressed tolerating this is best she can

## 2017-09-28 NOTE — Patient Instructions (Signed)
Please do labs and follow up in Nov or December

## 2017-10-09 ENCOUNTER — Other Ambulatory Visit: Payer: Self-pay | Admitting: Family Medicine

## 2017-10-09 MED FILL — NAPROXEN 500 MG TABLET: 500 | 30 days supply | Qty: 60 | Fill #0

## 2017-10-09 MED FILL — TORSEMIDE 20 MG TABLET: 20 | 30 days supply | Qty: 180 | Fill #3

## 2017-10-26 MED FILL — LEVOTHYROXINE 200 MCG TAB: 200 | 90 days supply | Qty: 96 | Fill #1

## 2017-10-26 MED FILL — METOPROLOL SUCCINATE ER 50: 50 | 90 days supply | Qty: 90 | Fill #1

## 2017-11-02 MED FILL — COLCHICINE 0.6 MG TABS: 0.6 | 90 days supply | Qty: 90 | Fill #3

## 2017-11-02 MED FILL — LISINOPRIL 2.5 MG TABLET: 2.5 | 90 days supply | Qty: 90 | Fill #2

## 2017-11-10 MED FILL — TORSEMIDE 20 MG TABLET: 20 | 30 days supply | Qty: 180 | Fill #4

## 2017-11-16 ENCOUNTER — Other Ambulatory Visit: Payer: Self-pay | Admitting: *Deleted

## 2017-11-16 ENCOUNTER — Telehealth: Payer: Self-pay | Admitting: Family Medicine

## 2017-11-16 MED ORDER — ALPRAZOLAM 1 MG PO TABS: ORAL_TABLET | ORAL | 5 refills | Status: DC

## 2017-11-16 MED FILL — ALPRAZolam 1 MG TABS: 1 | 10 days supply | Qty: 30 | Fill #0

## 2017-11-16 MED FILL — TRULICITY 1.5 MG/0.5 ML PEN: 1.5 | 28 days supply | Qty: 2 | Fill #2

## 2017-11-16 NOTE — Telephone Encounter (Signed)
rx faxed to pharm. Pt notified.  

## 2017-11-16 NOTE — Telephone Encounter (Signed)
May have this +5 refills will need follow-up visit by March

## 2017-11-16 NOTE — Telephone Encounter (Signed)
Last seen 09/28/17 for cellulitis of toe

## 2017-11-16 NOTE — Telephone Encounter (Signed)
Patient needing refill on xanax 1 called in [facility]

## 2017-11-16 NOTE — Telephone Encounter (Signed)
rx printed and await dr signature  Then will fax and call pt.

## 2017-11-30 MED FILL — NAPROXEN 500 MG TABLET: 500 | 30 days supply | Qty: 60 | Fill #1

## 2017-12-11 ENCOUNTER — Other Ambulatory Visit: Payer: Self-pay | Admitting: Family Medicine

## 2017-12-11 MED FILL — POTASSIUM CL ER 20 MEQ TAB: 20 | 90 days supply | Qty: 180 | Fill #0

## 2017-12-11 MED FILL — ALLOPURINOL 300 MG TABS: 300 | 90 days supply | Qty: 90 | Fill #0

## 2017-12-11 MED FILL — TORSEMIDE 20 MG TABLET: 20 | 30 days supply | Qty: 180 | Fill #5

## 2017-12-21 ENCOUNTER — Telehealth: Payer: Self-pay | Admitting: Family Medicine

## 2017-12-21 MED ORDER — CEPHALEXIN 500 MG PO CAPS: 500.0000 mg | ORAL_CAPSULE | Freq: Four times a day (QID) | ORAL | 0 refills | Status: AC

## 2017-12-21 NOTE — Telephone Encounter (Signed)
Nurses- please let the patient know that I can do so but please find out from the patient currently how severe does the patient feel currently-fever?  Severe pain?  How large of an area is a cellulitis?  Any sign of an abscess?

## 2017-12-21 NOTE — Telephone Encounter (Signed)
Please advise 

## 2017-12-21 NOTE — Telephone Encounter (Signed)
Keflex, 500 mg tablet, 1 pill 4 times daily 10 days Keep appointment for tomorrow

## 2017-12-21 NOTE — Telephone Encounter (Signed)
Patient has an appt scheduled for (L) leg cellulitis on 12/22/17 w/ Dr.Scott, patient wondering if she may have antibiotic sent in to pharmacy ahead of time,

## 2017-12-21 NOTE — Telephone Encounter (Signed)
Patient is aware we have sent medication to the requested pharmacy. 

## 2017-12-21 NOTE — Telephone Encounter (Signed)
I spoke with the pt and she says she is not running at temp. She says that the leg hurts just a little more than it normally does. It is not severe, and there is no abscess.([facility])

## 2017-12-22 ENCOUNTER — Encounter: Payer: Self-pay | Admitting: Family Medicine

## 2017-12-22 ENCOUNTER — Ambulatory Visit: Payer: 59 | Admitting: Family Medicine

## 2017-12-22 VITALS — BP 132/82 | Temp 97.7°F | Ht 67.0 in | Wt 322.8 lb

## 2017-12-22 DIAGNOSIS — E7849 Other hyperlipidemia: Secondary | ICD-10-CM | POA: Diagnosis not present

## 2017-12-22 DIAGNOSIS — L03115 Cellulitis of right lower limb: Secondary | ICD-10-CM

## 2017-12-22 DIAGNOSIS — E119 Type 2 diabetes mellitus without complications: Secondary | ICD-10-CM | POA: Diagnosis not present

## 2017-12-22 MED ORDER — TRIAMCINOLONE ACETONIDE 0.1 % EX CREA: TOPICAL_CREAM | CUTANEOUS | 4 refills | Status: DC

## 2017-12-22 MED FILL — TRIAMCINOLONE 0.1% CREAM: 0.1 | 14 days supply | Qty: 45 | Fill #0

## 2017-12-22 NOTE — Progress Notes (Signed)
   Subjective:    Patient ID: Amy Hunter, female    DOB: 1960/04/15, 57 y.o.   MRN: [id number]  HPI  Patient arrives for a follow up on right leg cellulitis. Patient currently on Keflex and patient states it is starting to look better. Significant cellulitis of the right lower leg no calf pain no foot pain she denies fever chills sweats she is diabetic she does have pedal edema she is significantly overweight she is trying to watch her diet Review of Systems  Constitutional: Negative for activity change, appetite change and fatigue.  HENT: Negative for congestion and rhinorrhea.   Respiratory: Negative for cough and shortness of breath.   Cardiovascular: Negative for chest pain and leg swelling.  Gastrointestinal: Negative for abdominal pain and diarrhea.  Endocrine: Negative for polydipsia and polyphagia.  Skin: Negative for color change.  Neurological: Negative for dizziness and weakness.  Psychiatric/Behavioral: Negative for behavioral problems and confusion.       Objective:   Physical Exam Negative Homans negative tenderness in the calf Lungs clear heart regular blood pressure good      Assessment & Plan:  Leg cellulitis Bilateral pedal edema Continue diuretic Continue antibiotic Warm compresses frequently Patient will do her diabetes lab work in the near future

## 2017-12-23 LAB — BASIC METABOLIC PANEL
BUN / CREAT RATIO: 27 — AB (ref 9–23)
BUN: 18 mg/dL (ref 6–24)
CALCIUM: 8.8 mg/dL (ref 8.7–10.2)
CO2: 19 mmol/L — ABNORMAL LOW (ref 20–29)
Chloride: 105 mmol/L (ref 96–106)
Creatinine, Ser: 0.67 mg/dL (ref 0.57–1.00)
GFR, EST AFRICAN AMERICAN: 112 mL/min/{1.73_m2} (ref >59)
GFR, EST NON AFRICAN AMERICAN: 97 mL/min/{1.73_m2} (ref >59)
Glucose: 89 mg/dL (ref 65–99)
Potassium: 4.2 mmol/L (ref 3.5–5.2)
Sodium: 141 mmol/L (ref 134–144)

## 2017-12-23 LAB — HEMOGLOBIN A1C
Est. average glucose Bld gHb Est-mCnc: 126 mg/dL
Hgb A1c MFr Bld: 6 % — ABNORMAL HIGH (ref 4.8–5.6)

## 2017-12-23 LAB — HEPATIC FUNCTION PANEL
ALK PHOS: 165 IU/L — AB (ref 39–117)
ALT: 48 IU/L — AB (ref 0–32)
AST: 34 IU/L (ref 0–40)
Albumin: 4.1 g/dL (ref 3.5–5.5)
BILIRUBIN TOTAL: 0.4 mg/dL (ref 0.0–1.2)
BILIRUBIN, DIRECT: 0.19 mg/dL (ref 0.00–0.40)
Total Protein: 6.8 g/dL (ref 6.0–8.5)

## 2017-12-23 LAB — LIPID PANEL
CHOLESTEROL TOTAL: 116 mg/dL (ref 100–199)
Chol/HDL Ratio: 3.3 ratio (ref 0.0–4.4)
HDL: 35 mg/dL — ABNORMAL LOW (ref >39)
LDL Calculated: 62 mg/dL (ref 0–99)
Triglycerides: 95 mg/dL (ref 0–149)
VLDL Cholesterol Cal: 19 mg/dL (ref 5–40)

## 2018-01-05 ENCOUNTER — Other Ambulatory Visit: Payer: Self-pay | Admitting: Family Medicine

## 2018-01-05 MED FILL — PRAVASTATIN SODIUM 20 MG TA: 20 | 90 days supply | Qty: 90 | Fill #0

## 2018-01-05 MED FILL — NAPROXEN 500 MG TABLET: 500 | 30 days supply | Qty: 60 | Fill #2

## 2018-01-12 ENCOUNTER — Telehealth: Payer: Self-pay

## 2018-01-12 MED ORDER — TRIAMCINOLONE ACETONIDE 0.1 % EX CREA: 1.0000 "application " | TOPICAL_CREAM | Freq: Two times a day (BID) | CUTANEOUS | 1 refills | Status: DC

## 2018-01-12 MED ORDER — HYDROXYZINE HCL 25 MG PO TABS: ORAL_TABLET | ORAL | 0 refills | Status: DC

## 2018-01-12 NOTE — Telephone Encounter (Signed)
Medication sent in and pt is aware of all. 

## 2018-01-12 NOTE — Telephone Encounter (Signed)
She can try hydroxyzine 25 mg 1 every 6 hours as needed itching, #20 Theoretically there is a possibility she could be allergic to hydroxyzine as well but it is less likely Obviously if any hives with hydroxyzine stop the hydroxyzine   may also use triamcinolone 0.1% cream apply twice daily as needed, 45 g, 1 refill  If ongoing troubles it would be wise to be seen

## 2018-01-12 NOTE — Telephone Encounter (Signed)
Patient called today stating she is broke out in a rash that just came on out of the blue. She is not allergic to anything that she knows of other than Benadryl and she says she does not have any shortness of breath nor tightness of chest or throat, no other symptoms. She wants to know if there is anything she can take Otc for this as she is itching all over,and she is allergic to the benadryl [facility]  [place] Kitchen

## 2018-01-14 ENCOUNTER — Ambulatory Visit: Payer: 59 | Admitting: Family Medicine

## 2018-01-14 ENCOUNTER — Encounter: Payer: Self-pay | Admitting: Family Medicine

## 2018-01-14 VITALS — Temp 98.2°F | Ht 67.0 in | Wt 323.2 lb

## 2018-01-14 DIAGNOSIS — R21 Rash and other nonspecific skin eruption: Secondary | ICD-10-CM | POA: Diagnosis not present

## 2018-01-14 MED ORDER — PREDNISONE 20 MG PO TABS: ORAL_TABLET | ORAL | 0 refills | Status: DC

## 2018-01-14 NOTE — Progress Notes (Signed)
   Subjective:    Patient ID: Amy Hunter, female    DOB: 1960-03-01, 57 y.o.   MRN: [id number]  Rash  This is a new problem. The current episode started in the past 7 days. The affected locations include the left arm, left hand, left wrist, left fingers, back, right arm, right hand, right wrist, right fingers, right lower leg, left lower leg, right toes, left toes, face and neck. The rash is characterized by itchiness (tingling). Treatments tried: Dr.Scott called in triamcilone cream for her. The treatment provided mild relief.   Pt states rash started on arms and looked like water blisters.   Arms  It started   Like water blisters   Stress   No new medicines No known exposures to any new topical agents  No hx of rash like this in the past   Review of Systems  Skin: Positive for rash.       Objective:   Physical Exam  Alert vitals stable, NAD. Blood pressure good on repeat. HEENT normal. Lungs clear. Heart regular rate and rhythm. Diffuse erythematous patchy scaly rash arms legs.  Some area with minimal blistering.  Impression nonspecific rash.  Really looks like a contact dermatitis or something similar.  Discussed.  Not responding to triamcinolone cream will add prednisone taper rationale discussed    Assessment & Plan:

## 2018-01-15 MED FILL — TRULICITY 1.5 MG/0.5 ML PEN: 1.5 | 28 days supply | Qty: 2 | Fill #3

## 2018-01-20 ENCOUNTER — Other Ambulatory Visit: Payer: Self-pay | Admitting: Family Medicine

## 2018-01-20 MED FILL — LEVOTHYROXINE 200 MCG TAB: 200 | 90 days supply | Qty: 96 | Fill #2

## 2018-01-20 MED FILL — LISINOPRIL 2.5 MG TABLET: 2.5 | 90 days supply | Qty: 90 | Fill #3

## 2018-01-21 NOTE — Telephone Encounter (Signed)
Six mo worth 

## 2018-01-22 MED FILL — METOPROLOL SUCCINATE ER 50: 50 | 90 days supply | Qty: 90 | Fill #0

## 2018-01-22 MED FILL — COLCHICINE 0.6 MG TABS: 0.6 | 90 days supply | Qty: 90 | Fill #0

## 2018-01-25 MED FILL — TORSEMIDE 20 MG TABLET: 20 | 90 days supply | Qty: 540 | Fill #0 | Status: TO

## 2018-02-15 MED FILL — NAPROXEN 500 MG TABLET: 500 | 30 days supply | Qty: 60 | Fill #3

## 2018-02-18 ENCOUNTER — Other Ambulatory Visit: Payer: Self-pay | Admitting: Family Medicine

## 2018-03-02 DIAGNOSIS — H35372 Puckering of macula, left eye: Secondary | ICD-10-CM | POA: Diagnosis not present

## 2018-03-02 DIAGNOSIS — H43813 Vitreous degeneration, bilateral: Secondary | ICD-10-CM | POA: Diagnosis not present

## 2018-03-03 ENCOUNTER — Ambulatory Visit: Payer: 59 | Admitting: Family Medicine

## 2018-03-03 ENCOUNTER — Encounter: Payer: Self-pay | Admitting: Family Medicine

## 2018-03-03 VITALS — BP 138/72 | Temp 98.6°F | Ht 67.0 in | Wt 327.8 lb

## 2018-03-03 DIAGNOSIS — J019 Acute sinusitis, unspecified: Secondary | ICD-10-CM

## 2018-03-03 MED ORDER — DOXYCYCLINE HYCLATE 100 MG PO TABS: 100.0000 mg | ORAL_TABLET | Freq: Two times a day (BID) | ORAL | 0 refills | Status: DC

## 2018-03-03 NOTE — Progress Notes (Signed)
   Subjective:    Patient ID: Amy Hunter, female    DOB: [birth year], 58 y.o.   MRN: [id number]  Cough  This is a new problem. Episode onset: 8 days. Associated symptoms include rhinorrhea. Pertinent negatives include no chest pain, ear pain, fever, shortness of breath or wheezing. Associated symptoms comments: Congestion, headache, no energy, ears itching. Treatments tried: otc meds.    Significant head congestion drainage coughing denies high fever chills sweats  Review of Systems  Constitutional: Negative for activity change and fever.  HENT: Positive for congestion and rhinorrhea. Negative for ear pain.   Eyes: Negative for discharge.  Respiratory: Positive for cough. Negative for shortness of breath and wheezing.   Cardiovascular: Negative for chest pain.       Objective:   Physical Exam Vitals signs and nursing note reviewed.  Constitutional:      Appearance: She is well-developed.  HENT:     Head: Normocephalic.     Nose: Nose normal.     Mouth/Throat:     Pharynx: No oropharyngeal exudate.  Neck:     Musculoskeletal: Neck supple.  Cardiovascular:     Rate and Rhythm: Normal rate.     Heart sounds: Normal heart sounds. No murmur.  Pulmonary:     Effort: Pulmonary effort is normal.     Breath sounds: Normal breath sounds. No wheezing.  Lymphadenopathy:     Cervical: No cervical adenopathy.  Skin:    General: Skin is warm and dry.           Assessment & Plan:  Patient was seen today for upper respiratory illness. It is felt that the patient is dealing with sinusitis.  Antibiotics were prescribed today. Importance of compliance with medication was discussed.  Symptoms should gradually resolve over the course of the next several days. If high fevers, progressive illness, difficulty breathing, worsening condition or failure for symptoms to improve over the next several days then the patient is to follow-up.  If any emergent conditions the patient is to  follow-up in the emergency department otherwise to follow-up in the office.

## 2018-03-10 ENCOUNTER — Telehealth: Payer: Self-pay | Admitting: Family Medicine

## 2018-03-10 MED ORDER — OSELTAMIVIR PHOSPHATE 75 MG PO CAPS: 75.0000 mg | ORAL_CAPSULE | Freq: Two times a day (BID) | ORAL | 0 refills | Status: DC

## 2018-03-10 NOTE — Telephone Encounter (Signed)
Patient advised Flu prevention typically is staying isolated But if sx of flu occur start Tamiflu If she would like a rx printed for Tamiflu we can Patient would like prescription printed in case she develops symptoms. Patient will pick up prescription tomorrow.

## 2018-03-10 NOTE — Telephone Encounter (Signed)
Patient would like to know if there is anything she could take to help prevent from getting flu in regards of both of pt's daughters diagnosed with the flu today.  Advise.   Pharmacy:  [facility] [address], [place] - [address]

## 2018-03-10 NOTE — Telephone Encounter (Signed)
Flu prevention typically is staying isolated But if sx of flu occur start Tamiflu If she would like a rx printed for Tamiflu we can

## 2018-03-11 NOTE — Telephone Encounter (Signed)
This was printed out

## 2018-03-12 ENCOUNTER — Other Ambulatory Visit: Payer: Self-pay | Admitting: Family Medicine

## 2018-03-12 MED FILL — ALLOPURINOL 100 MG TABS: 100 | 90 days supply | Qty: 90 | Fill #0

## 2018-03-15 MED FILL — ALPRAZolam 1 MG TABS: 1 | 10 days supply | Qty: 30 | Fill #1

## 2018-03-30 ENCOUNTER — Other Ambulatory Visit: Payer: Self-pay | Admitting: Family Medicine

## 2018-03-30 MED FILL — POTASSIUM CHLORIDE CRYS ER: 20 | 90 days supply | Qty: 180 | Fill #0 | Status: TO

## 2018-03-30 MED FILL — NAPROXEN 500 MG TABLET: 500 | 30 days supply | Qty: 60 | Fill #4 | Status: TO

## 2018-04-02 DIAGNOSIS — H35372 Puckering of macula, left eye: Secondary | ICD-10-CM | POA: Diagnosis not present

## 2018-04-02 DIAGNOSIS — H43813 Vitreous degeneration, bilateral: Secondary | ICD-10-CM | POA: Diagnosis not present

## 2018-04-02 DIAGNOSIS — H43392 Other vitreous opacities, left eye: Secondary | ICD-10-CM | POA: Diagnosis not present

## 2018-04-13 MED FILL — TRULICITY 1.5 MG/0.5 ML PEN: 1.5 | 28 days supply | Qty: 2 | Fill #4

## 2018-04-15 ENCOUNTER — Ambulatory Visit: Payer: 59 | Admitting: Family Medicine

## 2018-04-15 ENCOUNTER — Encounter: Payer: Self-pay | Admitting: Family Medicine

## 2018-04-15 ENCOUNTER — Other Ambulatory Visit: Payer: Self-pay

## 2018-04-15 VITALS — BP 130/80 | Temp 98.3°F | Ht 66.0 in | Wt 328.0 lb

## 2018-04-15 DIAGNOSIS — J111 Influenza due to unidentified influenza virus with other respiratory manifestations: Secondary | ICD-10-CM | POA: Diagnosis not present

## 2018-04-15 MED ORDER — OSELTAMIVIR PHOSPHATE 75 MG PO CAPS: 75.0000 mg | ORAL_CAPSULE | Freq: Two times a day (BID) | ORAL | 0 refills | Status: DC

## 2018-04-15 NOTE — Progress Notes (Signed)
  Subjective:     Patient ID: Amy Hunter, female   DOB: [birth year], 58 y.o.   MRN: [id number]  HPI Yesterday started with dry cough and feels terrible. States gotten worse since yesterday. No known fever but having chills and body aches. Cough is dry. Frontal h/a. Some nausea but no vomiting. No shortness of breath or wheezing.   Recently exposed to flu - daughter was diagnosed with flu two days ago, and grandson diagnosed yesterday as well. Never filled tamiflu rx from February, never developed symptoms then.   Has tried tylenol and throat spray.   Review of Systems  Constitutional: Positive for chills. Negative for fever.  HENT: Positive for congestion and sore throat. Negative for ear pain.   Eyes: Negative for discharge.  Respiratory: Positive for cough. Negative for shortness of breath and wheezing.   Gastrointestinal: Positive for nausea. Negative for diarrhea and vomiting.  Neurological: Positive for headaches.       Objective:   Physical Exam Vitals signs and nursing note reviewed.  Constitutional:      General: She is not in acute distress.    Appearance: She is not toxic-appearing.  HENT:     Head: Normocephalic and atraumatic.     Right Ear: Tympanic membrane normal.     Left Ear: Tympanic membrane normal.     Nose:     Right Sinus: Frontal sinus tenderness present. No maxillary sinus tenderness.     Left Sinus: Frontal sinus tenderness present. No maxillary sinus tenderness.     Mouth/Throat:     Mouth: Mucous membranes are moist.     Pharynx: Oropharynx is clear.  Eyes:     General:        Right eye: No discharge.        Left eye: No discharge.  Neck:     Musculoskeletal: Neck supple. No neck rigidity.  Cardiovascular:     Rate and Rhythm: Normal rate and regular rhythm.     Heart sounds: Normal heart sounds.  Pulmonary:     Effort: Pulmonary effort is normal. No respiratory distress.     Breath sounds: Normal breath sounds. No wheezing or rales.   Lymphadenopathy:     Cervical: No cervical adenopathy.  Skin:    General: Skin is warm and dry.  Neurological:     Mental Status: She is alert and oriented to person, place, and time.  Psychiatric:        Behavior: Behavior normal.        Assessment:     Influenza      Plan:     Discussed with patient likely influenza diagnosis based on symptoms, exam, and recent known exposure. Will treat with tamiflu. Symptomatic care discussed. Warning signs discussed.  Patient/family educated about the flu and warning signs to watch for. If difficulty breathing,  cyanosis, disorientation, or progressive worsening then immediately get rechecked at the ER. If progressive symptoms be certain to be rechecked. Supportive measures such as Tylenol/ibuprofen were discussed. No aspirin use in children. Encouraged increased fluid intake to maintain adequate hydration.

## 2018-04-15 NOTE — Patient Instructions (Signed)

## 2018-04-15 NOTE — Progress Notes (Signed)
   Subjective:    Patient ID: Amy Hunter, female    DOB: 02-17-1960, 58 y.o.   MRN: [id number]  HPI Patient is here today with complaints of cough,nauseated,headache,chills.  Exposed to two people diagnosed w the flu.   Review of Systems     Objective:   Physical Exam        Assessment & Plan:

## 2018-04-19 ENCOUNTER — Telehealth: Payer: Self-pay | Admitting: Family Medicine

## 2018-04-19 MED ORDER — BENZONATATE 100 MG PO CAPS: 100.0000 mg | ORAL_CAPSULE | Freq: Three times a day (TID) | ORAL | 2 refills | Status: DC | PRN

## 2018-04-19 NOTE — Telephone Encounter (Signed)
Prescription sent electronically to pharmacy. Patient notified. 

## 2018-04-19 NOTE — Telephone Encounter (Signed)
Patient states she is feeling better but still has lingering cough and she can not work with cough and would like some tessalon perles to help with cough

## 2018-04-19 NOTE — Telephone Encounter (Signed)
Patient was seen 3/12 for the flu and cough. She states still has bad cough and wanting something called into [facility] for cough.

## 2018-04-19 NOTE — Telephone Encounter (Signed)
Tessalon 100 mg, 1 taken 3 times daily as needed for cough, #20 with 2 refills

## 2018-04-23 MED FILL — LEVOTHYROXINE 200 MCG TAB: 200 | 90 days supply | Qty: 96 | Fill #3

## 2018-04-23 MED FILL — COLCHICINE 0.6 MG TABS: 0.6 | 90 days supply | Qty: 90 | Fill #1

## 2018-04-23 MED FILL — ALPRAZolam 1 MG TABS: 1 | 10 days supply | Qty: 30 | Fill #2

## 2018-04-23 MED FILL — METOPROLOL SUCCINATE ER 50: 50 | 90 days supply | Qty: 90 | Fill #1

## 2018-04-28 ENCOUNTER — Other Ambulatory Visit: Payer: Self-pay | Admitting: Family Medicine

## 2018-04-28 MED FILL — TORSEMIDE 20 MG TABLET: 20 | 90 days supply | Qty: 540 | Fill #0 | Status: TO

## 2018-04-29 NOTE — Telephone Encounter (Signed)
May have 1 refill on each needs to do tele-visit within the next 2 weeks

## 2018-04-29 NOTE — Telephone Encounter (Signed)
Patient aware she will need a tele visit.and was transferred up front. Medication sent to the requested pharmacy.

## 2018-05-06 ENCOUNTER — Other Ambulatory Visit: Payer: Self-pay

## 2018-05-06 ENCOUNTER — Ambulatory Visit (INDEPENDENT_AMBULATORY_CARE_PROVIDER_SITE_OTHER): Payer: 59 | Admitting: Family Medicine

## 2018-05-06 DIAGNOSIS — E119 Type 2 diabetes mellitus without complications: Secondary | ICD-10-CM

## 2018-05-06 DIAGNOSIS — E7849 Other hyperlipidemia: Secondary | ICD-10-CM | POA: Diagnosis not present

## 2018-05-06 MED ORDER — LISINOPRIL 2.5 MG PO TABS: 2.5000 mg | ORAL_TABLET | Freq: Every morning | ORAL | 1 refills | Status: DC

## 2018-05-06 MED ORDER — LEVOTHYROXINE SODIUM 200 MCG PO TABS: ORAL_TABLET | ORAL | 3 refills | Status: DC

## 2018-05-06 MED ORDER — NAPROXEN 500 MG PO TABS: ORAL_TABLET | ORAL | 1 refills | Status: DC

## 2018-05-06 MED ORDER — ALLOPURINOL 100 MG PO TABS: 100.0000 mg | ORAL_TABLET | Freq: Every day | ORAL | 1 refills | Status: DC

## 2018-05-06 MED ORDER — METOPROLOL SUCCINATE ER 50 MG PO TB24: 50.0000 mg | ORAL_TABLET | Freq: Every day | ORAL | 1 refills | Status: DC

## 2018-05-06 MED ORDER — ALPRAZOLAM 1 MG PO TABS: ORAL_TABLET | ORAL | 4 refills | Status: DC

## 2018-05-06 MED ORDER — PRAVASTATIN SODIUM 20 MG PO TABS: 20.0000 mg | ORAL_TABLET | Freq: Every evening | ORAL | 1 refills | Status: DC

## 2018-05-06 MED FILL — LISINOPRIL 2.5 MG TABLET: 2.5 | 90 days supply | Qty: 90 | Fill #0

## 2018-05-06 MED FILL — ALPRAZolam 1 MG TABS: 1 | 45 days supply | Qty: 90 | Fill #0

## 2018-05-06 MED FILL — ALLOPURINOL 100 MG TABS: 100 | 90 days supply | Qty: 90 | Fill #0

## 2018-05-06 MED FILL — NAPROXEN 500 MG TABLET: 500 | 90 days supply | Qty: 180 | Fill #0

## 2018-05-06 MED FILL — PRAVASTATIN SODIUM 20 MG TA: 20 | 90 days supply | Qty: 90 | Fill #0

## 2018-05-06 NOTE — Progress Notes (Signed)
   Subjective:    Patient ID: Amy Hunter, female    DOB: 03/06/1960, 58 y.o.   MRN: [id number]  Hypertension  This is a chronic problem. Compliance problems: takes meds every day, eats healthy, does the best she can with exercise.   Video was not capable for this patient all of this was done by phone she states her overall health is doing good she states her sugars are doing well she is doing the best she can to prevent coronavirus in addition to that she does take her allopurinol on a regular basis to prevent gout she uses Xanax intermittently once or twice a day denies abusing it does take her anti-inflammatory she does need refills on these medicines Pt states no problems today, just needs refills on meclizine, trulicity, allopurinol, xanax and naproxen.   Virtual Visit via Telephone Note  I connected with Amy Hunter on 05/06/18 at 11:00 AM EDT by telephone and verified that I am speaking with the correct person using two identifiers.   I discussed the limitations, risks, security and privacy concerns of performing an evaluation and management service by telephone and the availability of in person appointments. I also discussed with the patient that there may be a patient responsible charge related to this service. The patient expressed understanding and agreed to proceed.   History of Present Illness: She denies any severe pedal edema states her diuretic doing well she denies any cellulitis or current gout   Observations/Objective:   Assessment and Plan:   Follow Up Instructions:    I discussed the assessment and treatment plan with the patient. The patient was provided an opportunity to ask questions and all were answered. The patient agreed with the plan and demonstrated an understanding of the instructions.   The patient was advised to call back or seek an in-person evaluation if the symptoms worsen or if the condition fails to improve as anticipated.  I provided 17  minutes of non-face-to-face time during this encounter.   Kyra Manges, LPN    Review of Systems     Objective:   Physical Exam  Physical exam unable to do via phone call      Assessment & Plan:  Video not capable for this patient Telephone discussion held 15 minutes was spent with patient today discussing healthcare issues which they came.  More than 50% of this visit-total duration of visit-was spent in counseling and coordination of care.  Please see diagnosis regarding the focus of this coordination and care Blood pressure stable continue current measures Diabetes stable continue current measures Hyperlipidemia stable lab work will be done in the summertime previous labs reviewed

## 2018-06-18 MED FILL — POTASSIUM CHLORIDE CRYS ER: 20 | 90 days supply | Qty: 180 | Fill #0

## 2018-07-04 MED FILL — LEVOTHYROXINE 200 MCG TAB: 200 | 90 days supply | Qty: 96 | Fill #0

## 2018-07-05 MED FILL — METOPROLOL SUCCINATE ER 50: 50 | 90 days supply | Qty: 90 | Fill #0

## 2018-07-06 ENCOUNTER — Other Ambulatory Visit: Payer: Self-pay | Admitting: Family Medicine

## 2018-07-06 MED FILL — COLCHICINE 0.6 MG TABS: 0.6 | 90 days supply | Qty: 90 | Fill #0

## 2018-07-12 MED FILL — TRULICITY 1.5 MG/0.5 ML PEN: 1.5 | 28 days supply | Qty: 2 | Fill #5

## 2018-08-04 MED FILL — ALLOPURINOL 100 MG TABS: 100 | 90 days supply | Qty: 90 | Fill #0

## 2018-08-11 ENCOUNTER — Other Ambulatory Visit: Payer: Self-pay | Admitting: Family Medicine

## 2018-08-11 MED FILL — NAPROXEN 500 MG TABLET: 500 | 90 days supply | Qty: 180 | Fill #0

## 2018-08-11 MED FILL — ALPRAZolam 1 MG TABS: 1 | 45 days supply | Qty: 90 | Fill #0

## 2018-08-11 MED FILL — LISINOPRIL 2.5 MG TABLET: 2.5 | 90 days supply | Qty: 90 | Fill #0

## 2018-08-11 MED FILL — PRAVASTATIN SODIUM 20 MG TA: 20 | 90 days supply | Qty: 90 | Fill #0

## 2018-08-11 MED FILL — TORSEMIDE 20 MG TABLET: 20 | 90 days supply | Qty: 540 | Fill #0

## 2018-08-30 ENCOUNTER — Other Ambulatory Visit: Payer: Self-pay | Admitting: *Deleted

## 2018-08-30 ENCOUNTER — Telehealth: Payer: Self-pay | Admitting: Family Medicine

## 2018-08-30 MED ORDER — FLUCONAZOLE 150 MG PO TABS: ORAL_TABLET | ORAL | 0 refills | Status: DC

## 2018-08-30 NOTE — Telephone Encounter (Signed)
Discussed with pt. Pt verbalized understanding and med sent to pharm.  

## 2018-08-30 NOTE — Telephone Encounter (Signed)
Diflucan 150 mg 1 p.o. x1 then repeat in 1 week, #2, may use Monistat as needed If ongoing troubles she may need to follow-up with Hoyle Sauer

## 2018-08-30 NOTE — Telephone Encounter (Signed)
Pt thinks she has vaginitis again. States it has been a few years since she had it.  Having vaginal itching and burning when urine touches her skin.  No discharge.  Started Friday evening. Used otc creams and it has not helped any.   [facility]

## 2018-08-30 NOTE — Telephone Encounter (Signed)
Pt would like a nurse to call her to discuss a personal matter she didn't feel comfortable talking to front office about.

## 2018-09-20 ENCOUNTER — Other Ambulatory Visit: Payer: Self-pay | Admitting: Family Medicine

## 2018-09-20 MED FILL — POTASSIUM CHLORIDE CRYS ER: 20 | 90 days supply | Qty: 180 | Fill #0

## 2018-09-24 ENCOUNTER — Ambulatory Visit (INDEPENDENT_AMBULATORY_CARE_PROVIDER_SITE_OTHER): Payer: 59 | Admitting: Nurse Practitioner

## 2018-09-24 ENCOUNTER — Other Ambulatory Visit: Payer: Self-pay

## 2018-09-24 VITALS — BP 130/78 | Temp 98.0°F | Ht 66.0 in | Wt 329.0 lb

## 2018-09-24 DIAGNOSIS — B356 Tinea cruris: Secondary | ICD-10-CM

## 2018-09-24 MED ORDER — CLOBETASOL PROPIONATE 0.05 % EX CREA: 1.0000 "application " | TOPICAL_CREAM | Freq: Two times a day (BID) | CUTANEOUS | 0 refills | Status: DC

## 2018-09-24 MED ORDER — KETOCONAZOLE 2 % EX CREA: 1.0000 "application " | TOPICAL_CREAM | Freq: Two times a day (BID) | CUTANEOUS | 0 refills | Status: DC

## 2018-09-24 NOTE — Progress Notes (Signed)
   Subjective:    Patient ID: Amy Hunter, female    DOB: 1960-08-12, 58 y.o.   MRN: [id number]  HPI vaginal itching for over one month. No discharge. Tried monistat, vagisil, and diflucan.  Presents for complaints of itching in the vaginal area for the past month.  Scratching at times.  No fever chills.  Minimal white discharge.  No pelvic pain.  No dysuria.  No new sexual partners.  Patient is diabetic.  Sugars are running average 138-150 with a rare 200+ sugar.   Review of Systems     Objective:   Physical Exam NAD.  Alert, oriented.  Lungs clear.  Heart regular rate and rhythm.  External GU confluent faint shiny pink rash noted covering the external labial area and mons pubis.  Dry in appearance.  Faint excoriation noted.  Inner labia pink with no lesions or rash.  Vagina pink and moist, no discharge noted.       Assessment & Plan:   Problem List Items Addressed This Visit    None    Visit Diagnoses    Fungal infection of the groin    -  Primary   Relevant Medications   ketoconazole (NIZORAL) 2 % cream     Meds ordered this encounter  Medications  . ketoconazole (NIZORAL) 2 % cream    Sig: Apply 1 application topically 2 (two) times daily. To rash prn up to 2 weeks    Dispense:  30 g    Refill:  0    Order Specific Question:   Supervising Provider    Answer:   Sallee Lange A [[id number]]  . clobetasol cream (TEMOVATE) 0.05 %    Sig: Apply 1 application topically 2 (two) times daily. To rash up to 2 weeks    Dispense:  30 g    Refill:  0    Order Specific Question:   Supervising Provider    Answer:   Sallee Lange A [[id number]]   Apply both creams externally no more than 2 weeks.  Call back at that time if symptoms persist or if they recur.  Also call back if any new rashes or lesions are noted.  Routine follow-up for her diabetes.

## 2018-09-25 ENCOUNTER — Encounter: Payer: Self-pay | Admitting: Nurse Practitioner

## 2018-10-07 ENCOUNTER — Telehealth: Payer: Self-pay | Admitting: Family Medicine

## 2018-10-07 DIAGNOSIS — E119 Type 2 diabetes mellitus without complications: Secondary | ICD-10-CM

## 2018-10-07 DIAGNOSIS — E7849 Other hyperlipidemia: Secondary | ICD-10-CM

## 2018-10-07 DIAGNOSIS — Z79899 Other long term (current) drug therapy: Secondary | ICD-10-CM

## 2018-10-07 NOTE — Telephone Encounter (Signed)
Nurses Patient is due for lab work and office visit For to complete lab work in September and do a office visit late September or early October This office visit can be in person but if patient is concerned about coming to the office we can do a virtual visit  Lab work needed A1c, lipid, liver, metabolic 7, urine ACR it is best for the patient to complete lab work before her visit

## 2018-10-07 NOTE — Addendum Note (Signed)
Addended by: Vicente Males on: 10/07/2018 09:54 AM   Modules accepted: Orders

## 2018-10-07 NOTE — Telephone Encounter (Signed)
Lab orders placed. Pt contacted and is aware. Pt states she will need to look at her schedule and then give [place] a call back to schedule appt.

## 2018-10-12 ENCOUNTER — Other Ambulatory Visit: Payer: Self-pay | Admitting: Family Medicine

## 2018-10-12 MED FILL — METOPROLOL SUCCINATE ER 50: 50 | 90 days supply | Qty: 90 | Fill #0

## 2018-10-12 MED FILL — TRULICITY 1.5 MG/0.5 ML PEN: 1.5 | 28 days supply | Qty: 2 | Fill #0

## 2018-10-20 MED FILL — COLCHICINE 0.6 MG TABS: 0.6 | 90 days supply | Qty: 90 | Fill #1

## 2018-10-20 MED FILL — LEVOTHYROXINE 200 MCG TAB: 200 | 90 days supply | Qty: 96 | Fill #0

## 2018-10-21 DIAGNOSIS — Z79899 Other long term (current) drug therapy: Secondary | ICD-10-CM | POA: Diagnosis not present

## 2018-10-21 DIAGNOSIS — E7849 Other hyperlipidemia: Secondary | ICD-10-CM | POA: Diagnosis not present

## 2018-10-21 DIAGNOSIS — E119 Type 2 diabetes mellitus without complications: Secondary | ICD-10-CM | POA: Diagnosis not present

## 2018-10-22 LAB — BASIC METABOLIC PANEL
BUN/Creatinine Ratio: 28 — ABNORMAL HIGH (ref 9–23)
BUN: 19 mg/dL (ref 6–24)
CO2: 25 mmol/L (ref 20–29)
Calcium: 8.9 mg/dL (ref 8.7–10.2)
Chloride: 103 mmol/L (ref 96–106)
Creatinine, Ser: 0.68 mg/dL (ref 0.57–1.00)
GFR calc Af Amer: 111 mL/min/{1.73_m2} (ref >59)
GFR calc non Af Amer: 96 mL/min/{1.73_m2} (ref >59)
Glucose: 137 mg/dL — ABNORMAL HIGH (ref 65–99)
Potassium: 4.1 mmol/L (ref 3.5–5.2)
Sodium: 142 mmol/L (ref 134–144)

## 2018-10-22 LAB — LIPID PANEL
Chol/HDL Ratio: 3.3 ratio (ref 0.0–4.4)
Cholesterol, Total: 143 mg/dL (ref 100–199)
HDL: 44 mg/dL (ref >39)
LDL Chol Calc (NIH): 84 mg/dL (ref 0–99)
Triglycerides: 76 mg/dL (ref 0–149)
VLDL Cholesterol Cal: 15 mg/dL (ref 5–40)

## 2018-10-22 LAB — HEPATIC FUNCTION PANEL
ALT: 85 IU/L — ABNORMAL HIGH (ref 0–32)
AST: 67 IU/L — ABNORMAL HIGH (ref 0–40)
Albumin: 3.9 g/dL (ref 3.8–4.9)
Alkaline Phosphatase: 169 IU/L — ABNORMAL HIGH (ref 39–117)
Bilirubin Total: 0.3 mg/dL (ref 0.0–1.2)
Bilirubin, Direct: 0.13 mg/dL (ref 0.00–0.40)
Total Protein: 6.1 g/dL (ref 6.0–8.5)

## 2018-10-22 LAB — HEMOGLOBIN A1C
Est. average glucose Bld gHb Est-mCnc: 140 mg/dL
Hgb A1c MFr Bld: 6.5 % — ABNORMAL HIGH (ref 4.8–5.6)

## 2018-10-22 LAB — MICROALBUMIN / CREATININE URINE RATIO
Creatinine, Urine: 75.4 mg/dL
Microalb/Creat Ratio: 8 mg/g creat (ref 0–29)
Microalbumin, Urine: 6.3 ug/mL

## 2018-11-04 ENCOUNTER — Other Ambulatory Visit: Payer: Self-pay | Admitting: Family Medicine

## 2018-11-04 ENCOUNTER — Ambulatory Visit (INDEPENDENT_AMBULATORY_CARE_PROVIDER_SITE_OTHER): Payer: 59 | Admitting: Family Medicine

## 2018-11-04 DIAGNOSIS — E119 Type 2 diabetes mellitus without complications: Secondary | ICD-10-CM

## 2018-11-04 DIAGNOSIS — E7849 Other hyperlipidemia: Secondary | ICD-10-CM

## 2018-11-04 DIAGNOSIS — E039 Hypothyroidism, unspecified: Secondary | ICD-10-CM | POA: Diagnosis not present

## 2018-11-04 DIAGNOSIS — M109 Gout, unspecified: Secondary | ICD-10-CM | POA: Diagnosis not present

## 2018-11-04 DIAGNOSIS — N61 Mastitis without abscess: Secondary | ICD-10-CM | POA: Diagnosis not present

## 2018-11-04 DIAGNOSIS — R748 Abnormal levels of other serum enzymes: Secondary | ICD-10-CM

## 2018-11-04 MED ORDER — PRAVASTATIN SODIUM 20 MG PO TABS: 20.0000 mg | ORAL_TABLET | Freq: Every evening | ORAL | 1 refills | Status: DC

## 2018-11-04 MED ORDER — POTASSIUM CHLORIDE CRYS ER 20 MEQ PO TBCR: 20.0000 meq | EXTENDED_RELEASE_TABLET | Freq: Two times a day (BID) | ORAL | 1 refills | Status: DC

## 2018-11-04 MED ORDER — ALLOPURINOL 100 MG PO TABS: 100.0000 mg | ORAL_TABLET | Freq: Every day | ORAL | 1 refills | Status: DC

## 2018-11-04 MED ORDER — TRULICITY 1.5 MG/0.5ML ~~LOC~~ SOAJ: SUBCUTANEOUS | 1 refills | Status: DC

## 2018-11-04 MED ORDER — DOXYCYCLINE HYCLATE 100 MG PO TABS: ORAL_TABLET | ORAL | 0 refills | Status: DC

## 2018-11-04 MED ORDER — LISINOPRIL 2.5 MG PO TABS: 2.5000 mg | ORAL_TABLET | Freq: Every morning | ORAL | 1 refills | Status: DC

## 2018-11-04 MED ORDER — METOPROLOL SUCCINATE ER 50 MG PO TB24: 50.0000 mg | ORAL_TABLET | Freq: Every day | ORAL | 1 refills | Status: DC

## 2018-11-04 MED ORDER — ALPRAZOLAM 1 MG PO TABS: ORAL_TABLET | ORAL | 4 refills | Status: DC

## 2018-11-04 MED FILL — TRULICITY 1.5 MG/0.5 ML PEN: 1.5 | 84 days supply | Qty: 6 | Fill #0

## 2018-11-04 MED FILL — LISINOPRIL 2.5 MG TABLET: 2.5 | 90 days supply | Qty: 90 | Fill #0

## 2018-11-04 MED FILL — ALLOPURINOL 100 MG TABS: 100 | 90 days supply | Qty: 90 | Fill #0

## 2018-11-04 MED FILL — PRAVASTATIN SODIUM 20 MG TA: 20 | 90 days supply | Qty: 90 | Fill #0

## 2018-11-04 NOTE — Progress Notes (Signed)
Subjective:    Patient ID: Amy Hunter, female    DOB: Jul 13, 1960, 58 y.o.   MRN: [id number]  Diabetes She presents for her follow-up diabetic visit. She has type 2 diabetes mellitus. There are no hypoglycemic associated symptoms. Pertinent negatives for hypoglycemia include no confusion or dizziness. There are no diabetic associated symptoms. Pertinent negatives for diabetes include no chest pain, no fatigue, no polydipsia, no polyphagia and no weakness. There are no hypoglycemic complications. There are no diabetic complications. She does not see a podiatrist.Eye exam is current.  Pt states she checks her sugar at least once a day.  Pt states she believes she is having cellulitis in left breast. Symptoms started yesterday. Left breast is painful to touch, red, sore and feels heavy.  She describes this area as being somewhat swollen red to the touch somewhat tender denies any other particular trouble  Her counts under good control takes her medicine no recent flareups  Thyroid taking her medicine states energy level doing good previous labs look good she will need to do some additional labs  Her liver enzymes are moderately up compared to where they usually are this is concerning and indicates the possibility of some underlying issues and she understands the importance of doing ultrasound and lab work to look at fatty liver or other potential causes  Hyperlipidemia she does tolerate her medicine in the past lab work looks good she will continue current medication  She will do her flu shot coming up.  Virtual Visit via Video Note  I connected with Amy Hunter on 11/04/18 at 11:00 AM EDT by a video enabled telemedicine application and verified that I am speaking with the correct person using two identifiers.  Location: Patient: home Provider: office   I discussed the limitations of evaluation and management by telemedicine and the availability of in person appointments. The  patient expressed understanding and agreed to proceed.  History of Present Illness:    Observations/Objective:   Assessment and Plan:   Follow Up Instructions:    I discussed the assessment and treatment plan with the patient. The patient was provided an opportunity to ask questions and all were answered. The patient agreed with the plan and demonstrated an understanding of the instructions.   The patient was advised to call back or seek an in-person evaluation if the symptoms worsen or if the condition fails to improve as anticipated.  I provided 25 minutes of non-face-to-face time during this encounter.   Marlowe Shores, LPN     Review of Systems  Constitutional: Negative for activity change, appetite change and fatigue.  HENT: Negative for congestion and rhinorrhea.   Respiratory: Negative for cough and shortness of breath.   Cardiovascular: Negative for chest pain and leg swelling.  Gastrointestinal: Negative for abdominal pain and diarrhea.  Endocrine: Negative for polydipsia and polyphagia.  Skin: Negative for color change.  Neurological: Negative for dizziness and weakness.  Psychiatric/Behavioral: Negative for behavioral problems and confusion.       Objective:   Physical Exam   Today's visit was via telephone Physical exam was not possible for this visit       Assessment & Plan:  1. Hypothyroidism, unspecified type She relates taking her medication.  We will check lab work.  Await the results.  Energy level good - CBC with Differential - Hepatitis C Antibody - Hepatitis B Surface AntiGEN - TSH - Uric acid - Ceruloplasmin - Ferritin  2. Type 2 diabetes mellitus without  complication, without long-term current use of insulin ([place]) Diabetes been doing well.  She is doing her best she can and following diet and medication.  Denies problems - CBC with Differential - Hepatitis C Antibody - Hepatitis B Surface AntiGEN - TSH - Uric acid -  Ceruloplasmin - Ferritin  3. Morbid obesity ([place]) She is working hard to try and lose weight which she states is very difficult she is minimizing what she is eating and she is exercising more often  4. Other hyperlipidemia She does have cholesterol issues but the lab work actually looks good she will continue her medication I am concerned about the elevated liver enzymes - CBC with Differential - Hepatitis C Antibody - Hepatitis B Surface AntiGEN - TSH - Uric acid - Ceruloplasmin - Ferritin  5. Elevated liver enzymes Elevated liver enzymes could well be related to fatty liver we need to do additional testing and an ultrasound I doubt that this is due to the statin because she has been on it for a long time - CBC with Differential - Hepatitis C Antibody - Hepatitis B Surface AntiGEN - TSH - Uric acid - Ceruloplasmin - Ferritin - US Abdomen Limited RUQ  6. Cellulitis of breast She relates cellulitis of the left breast we will go with doxycycline twice daily for 10 days with warm compresses but very important follow-up in 2 weeks to examine the breast make sure it is totally gone away if it is not totally gone away we do suspect the possibility of underlying cancer  7. Acute gout, unspecified cause, unspecified site Uric acid no flareups recently.  Doing well with diet await the results of the test continue to medication - CBC with Differential - Hepatitis C Antibody - Hepatitis B Surface AntiGEN - TSH - Uric acid - Ceruloplasmin - Ferritin   25 minutes was spent with the patient.  This statement verifies that 25 minutes was indeed spent with the patient.  More than 50% of this visit-total duration of the visit-was spent in counseling and coordination of care. The issues that the patient came in for today as reflected in the diagnosis (s) please refer to documentation for further details.

## 2018-11-05 ENCOUNTER — Ambulatory Visit (INDEPENDENT_AMBULATORY_CARE_PROVIDER_SITE_OTHER): Payer: 59 | Admitting: Nurse Practitioner

## 2018-11-05 ENCOUNTER — Other Ambulatory Visit: Payer: Self-pay

## 2018-11-05 ENCOUNTER — Encounter: Payer: Self-pay | Admitting: Nurse Practitioner

## 2018-11-05 VITALS — BP 134/88 | Temp 97.6°F | Wt 334.4 lb

## 2018-11-05 DIAGNOSIS — R21 Rash and other nonspecific skin eruption: Secondary | ICD-10-CM

## 2018-11-05 MED ORDER — PREDNISONE 20 MG PO TABS: ORAL_TABLET | ORAL | 0 refills | Status: DC

## 2018-11-05 NOTE — Progress Notes (Signed)
   Subjective:    Patient ID: Amy Hunter, female    DOB: 06-16-1960, 58 y.o.   MRN: [id number]  HPI Pt is here today for breast pain. Noticed Wednesday. Pt states that she is now broke out in splotchy red areas. Pt was started on antibiotics yesterday.  Presents for c/o areas of redness that started on the left breast 2 days ago. Has spread to the other breast. Minimally pruritic. Mildly tender. No fever. Area does feel warm. No known contacts. No change in hygiene products. No recent changes in medications. Has plaque psoriasis. Started Doxycyline yesterday.   Review of Systems     Objective:   Physical Exam NAD. Alert, oriented. Multiple erythematous discrete circular lesions noted along the upper breasts and anterior chest wall. Minimally raised and non tender. Two of the lesions have very tiny early pustules. No excoriation. Several scattered early lesions noted on both arms with a few on the upper lower abdomen and back. Large, dry scaly hypopigmented plaque lesion noted on the right mid back area.       Assessment & Plan:  Rash and nonspecific skin eruption  Continue Doxycycline as directed.  Meds ordered this encounter  Medications  . predniSONE (DELTASONE) 20 MG tablet    Sig: 3 po qd x 3 d then 2 po qd x 3 d then 1 po qd x 2 d    Dispense:  17 tablet    Refill:  0    Order Specific Question:   Supervising Provider    Answer:   Sallee Lange A [[id number]]   Add steroid taper to regimen. Warning signs reviewed. Call back in 72 hours if no improvement, go to Urgent Care or ED over the weekend if worse.

## 2018-11-06 ENCOUNTER — Encounter: Payer: Self-pay | Admitting: Nurse Practitioner

## 2018-11-11 ENCOUNTER — Other Ambulatory Visit: Payer: Self-pay | Admitting: Family Medicine

## 2018-11-11 MED FILL — TORSEMIDE 20 MG TABLET: 20 | 90 days supply | Qty: 540 | Fill #0

## 2018-11-12 ENCOUNTER — Encounter: Payer: Self-pay | Admitting: *Deleted

## 2018-11-12 ENCOUNTER — Telehealth: Payer: Self-pay | Admitting: Family Medicine

## 2018-11-12 NOTE — Telephone Encounter (Signed)
Pt states rash is spreading & no better

## 2018-11-12 NOTE — Telephone Encounter (Signed)
Sent patient a my chart message requesting pictures of the rash

## 2018-11-12 NOTE — Telephone Encounter (Signed)
Pt states rash no better & is spreading Had virtual visit 11/05/2018 with Hoyle Sauer  Suggestions?  Please advise & call pt     [facility]

## 2018-11-12 NOTE — Telephone Encounter (Signed)
Sent her a my chart message. Glad she called back. Can she send me a picture through my chart of her upper chest or other areas of rash? This will be saved directly into her chart.

## 2018-11-15 ENCOUNTER — Ambulatory Visit (HOSPITAL_COMMUNITY): Payer: 59

## 2018-11-17 ENCOUNTER — Other Ambulatory Visit (HOSPITAL_COMMUNITY): Payer: 59

## 2018-11-18 ENCOUNTER — Other Ambulatory Visit: Payer: Self-pay

## 2018-11-18 ENCOUNTER — Ambulatory Visit (HOSPITAL_COMMUNITY)
Admission: RE | Admit: 2018-11-18 | Discharge: 2018-11-18 | Disposition: A | Discharge status: Home / Self Care | Payer: 59 | Source: Ambulatory Visit | Attending: Family Medicine | Admitting: Family Medicine

## 2018-11-18 ENCOUNTER — Ambulatory Visit: Payer: 59 | Admitting: Family Medicine

## 2018-11-18 DIAGNOSIS — E039 Hypothyroidism, unspecified: Secondary | ICD-10-CM | POA: Diagnosis not present

## 2018-11-18 DIAGNOSIS — R748 Abnormal levels of other serum enzymes: Secondary | ICD-10-CM | POA: Insufficient documentation

## 2018-11-18 DIAGNOSIS — M109 Gout, unspecified: Secondary | ICD-10-CM | POA: Diagnosis not present

## 2018-11-18 DIAGNOSIS — E119 Type 2 diabetes mellitus without complications: Secondary | ICD-10-CM | POA: Diagnosis not present

## 2018-11-18 DIAGNOSIS — K760 Fatty (change of) liver, not elsewhere classified: Secondary | ICD-10-CM | POA: Diagnosis not present

## 2018-11-18 DIAGNOSIS — E7849 Other hyperlipidemia: Secondary | ICD-10-CM | POA: Diagnosis not present

## 2018-11-19 ENCOUNTER — Ambulatory Visit (HOSPITAL_COMMUNITY): Payer: 59

## 2018-11-19 ENCOUNTER — Encounter: Payer: Self-pay | Admitting: Nurse Practitioner

## 2018-11-19 LAB — CBC WITH DIFFERENTIAL/PLATELET
Basophils Absolute: 0.1 10*3/uL (ref 0.0–0.2)
Basos: 1 %
EOS (ABSOLUTE): 0.2 10*3/uL (ref 0.0–0.4)
Eos: 3 %
Hematocrit: 41.6 % (ref 34.0–46.6)
Hemoglobin: 13.9 g/dL (ref 11.1–15.9)
Immature Grans (Abs): 0 10*3/uL (ref 0.0–0.1)
Immature Granulocytes: 0 %
Lymphocytes Absolute: 2.8 10*3/uL (ref 0.7–3.1)
Lymphs: 36 %
MCH: 27.3 pg (ref 26.6–33.0)
MCHC: 33.4 g/dL (ref 31.5–35.7)
MCV: 82 fL (ref 79–97)
Monocytes Absolute: 0.9 10*3/uL (ref 0.1–0.9)
Monocytes: 11 %
Neutrophils Absolute: 3.9 10*3/uL (ref 1.4–7.0)
Neutrophils: 49 %
Platelets: 160 10*3/uL (ref 150–450)
RBC: 5.09 x10E6/uL (ref 3.77–5.28)
RDW: 14.6 % (ref 11.7–15.4)
WBC: 7.9 10*3/uL (ref 3.4–10.8)

## 2018-11-19 LAB — HEPATITIS C ANTIBODY: Hep C Virus Ab: <0.1 s/co ratio (ref 0.0–0.9)

## 2018-11-19 LAB — HEPATITIS B SURFACE ANTIGEN: Hepatitis B Surface Ag: NEGATIVE

## 2018-11-19 LAB — CERULOPLASMIN: Ceruloplasmin: 22 mg/dL (ref 19.0–39.0)

## 2018-11-19 LAB — TSH: TSH: 1.8 u[IU]/mL (ref 0.450–4.500)

## 2018-11-19 LAB — URIC ACID: Uric Acid: 9.8 mg/dL — ABNORMAL HIGH (ref 2.5–7.1)

## 2018-11-19 LAB — FERRITIN: Ferritin: 106 ng/mL (ref 15–150)

## 2018-11-19 NOTE — Telephone Encounter (Signed)
Contacted patient by my chart. Waiting on response. No pics were received.

## 2018-11-22 ENCOUNTER — Other Ambulatory Visit: Payer: Self-pay

## 2018-11-22 ENCOUNTER — Ambulatory Visit (INDEPENDENT_AMBULATORY_CARE_PROVIDER_SITE_OTHER): Payer: 59 | Admitting: Family Medicine

## 2018-11-22 ENCOUNTER — Encounter: Payer: Self-pay | Admitting: Family Medicine

## 2018-11-22 VITALS — BP 124/78 | Temp 96.2°F | Wt 336.6 lb

## 2018-11-22 DIAGNOSIS — K760 Fatty (change of) liver, not elsewhere classified: Secondary | ICD-10-CM

## 2018-11-22 DIAGNOSIS — E039 Hypothyroidism, unspecified: Secondary | ICD-10-CM | POA: Diagnosis not present

## 2018-11-22 DIAGNOSIS — E119 Type 2 diabetes mellitus without complications: Secondary | ICD-10-CM | POA: Diagnosis not present

## 2018-11-22 DIAGNOSIS — E7849 Other hyperlipidemia: Secondary | ICD-10-CM | POA: Diagnosis not present

## 2018-11-22 DIAGNOSIS — E118 Type 2 diabetes mellitus with unspecified complications: Secondary | ICD-10-CM

## 2018-11-22 MED ORDER — ALLOPURINOL 100 MG PO TABS: ORAL_TABLET | ORAL | 1 refills | Status: DC

## 2018-11-22 MED ORDER — TRIAMCINOLONE ACETONIDE 0.1 % EX CREA: TOPICAL_CREAM | CUTANEOUS | 1 refills | Status: DC

## 2018-11-22 NOTE — Progress Notes (Signed)
Subjective:    Patient ID: Amy Hunter, female    DOB: 10/07/1960, 58 y.o.   MRN: [id number]  HPI Pt here today due rash on breast. Pt states the rash has been on both breast for about 3 weeks. No itching or not painful. Pt has tried antibiotics and prednisone. Left breast had a bruised look to it a couple days ago. Patient for follow-up regarding possibility of breast cellulitis she states that the rash not causing any pain or discomfort no swelling with it.   A1c under good control watching diet fairly closely staying true to her medications.  Not having any setbacks  Patient stress levels have been mildly elevated with Covid but uses Xanax sparingly denies abusing it  Patient has thyroid condition.  Takes thyroid medication on a regular basis.  States that the proper way.  Relates compliance.  States no negative side effects.  States condition seems to be under good control.  The patient's BMI is calculated.  The patient does have obesity.  The patient does try to some degree staying active and watching diet.  It is in the vital signs and acknowledged.  It is above the recommended BMI for the patient's height and weight.  The patient has been counseled regarding healthy diet, restricted portions, avoiding excessive carbohydrates/sugary foods, and increase physical activity as health permits.  It is in the patient's best interest to lower the risk of secondary illness including heart disease strokes and cancer by losing weight.  The patient acknowledges this information.   Results for orders placed or performed in visit on 11/04/18  CBC with Differential  Result Value Ref Range   WBC 7.9 3.4 - 10.8 x10E3/uL   RBC 5.09 3.77 - 5.28 x10E6/uL   Hemoglobin 13.9 11.1 - 15.9 g/dL   Hematocrit 41.6 34.0 - 46.6 %   MCV 82 79 - 97 fL   MCH 27.3 26.6 - 33.0 pg   MCHC 33.4 31.5 - 35.7 g/dL   RDW 14.6 11.7 - 15.4 %   Platelets 160 150 - 450 x10E3/uL   Neutrophils 49 Not Estab. %   Lymphs  36 Not Estab. %   Monocytes 11 Not Estab. %   Eos 3 Not Estab. %   Basos 1 Not Estab. %   Neutrophils Absolute 3.9 1.4 - 7.0 x10E3/uL   Lymphocytes Absolute 2.8 0.7 - 3.1 x10E3/uL   Monocytes Absolute 0.9 0.1 - 0.9 x10E3/uL   EOS (ABSOLUTE) 0.2 0.0 - 0.4 x10E3/uL   Basophils Absolute 0.1 0.0 - 0.2 x10E3/uL   Immature Granulocytes 0 Not Estab. %   Immature Grans (Abs) 0.0 0.0 - 0.1 x10E3/uL  Hepatitis C Antibody  Result Value Ref Range   Hep C Virus Ab <0.1 0.0 - 0.9 s/co ratio  Hepatitis B Surface AntiGEN  Result Value Ref Range   Hepatitis B Surface Ag Negative Negative  TSH  Result Value Ref Range   TSH 1.800 0.450 - 4.500 uIU/mL  Uric acid  Result Value Ref Range   Uric Acid 9.8 (H) 2.5 - 7.1 mg/dL  Ceruloplasmin  Result Value Ref Range   Ceruloplasmin 22.0 19.0 - 39.0 mg/dL  Ferritin  Result Value Ref Range   Ferritin 106 15 - 150 ng/mL   Patient also with fatty liver recent ultrasound was discussed with patient as well as lab work.  Patient morbidly obese she is trying to watch her portions try and lose some weight  She has a history of gout her uric  acid level is elevated we will need to adjust medication  Patient under some stress with work and the loss of her husband a while back but she is adjusting  Diabetes she does try to do a good job watching starches in her diet does need to monitor.  Also has underlying thyroid takes her medication regular basis lab work was just recently checked    Review of Systems  Constitutional: Negative for activity change, appetite change and fatigue.  HENT: Negative for congestion and rhinorrhea.   Respiratory: Negative for cough and shortness of breath.   Cardiovascular: Negative for chest pain and leg swelling.  Gastrointestinal: Negative for abdominal pain and diarrhea.  Endocrine: Negative for polydipsia and polyphagia.  Skin: Negative for color change.  Neurological: Negative for dizziness and weakness.   Psychiatric/Behavioral: Negative for behavioral problems and confusion.       Objective:   Physical Exam Vitals signs reviewed.  Constitutional:      General: She is not in acute distress. HENT:     Head: Normocephalic and atraumatic.  Eyes:     General:        Right eye: No discharge.        Left eye: No discharge.  Neck:     Trachea: No tracheal deviation.  Cardiovascular:     Rate and Rhythm: Normal rate and regular rhythm.     Heart sounds: Normal heart sounds. No murmur.  Pulmonary:     Effort: Pulmonary effort is normal. No respiratory distress.     Breath sounds: Normal breath sounds.  Lymphadenopathy:     Cervical: No cervical adenopathy.  Skin:    General: Skin is warm and dry.  Neurological:     Mental Status: She is alert.     Coordination: Coordination normal.  Psychiatric:        Behavior: Behavior normal.    Breast exam bilateral does not show cellulitis but does show some rash noted on both sides       Assessment & Plan:  1. Type 2 diabetes mellitus without complication, without long-term current use of insulin ([facility]) Lab work looks good A1c under decent control continue current measures  2. Hypothyroidism, unspecified type Thyroid under good control continue current measures  3. Morbid obesity ([facility]) Watch portions try to lose weight to help her overall health situation  4. Other hyperlipidemia Cholesterol under good control continue current measures  5. Fatty liver Fatty liver very important to lose weight to lessen the risk of cirrhosis  6. Controlled type 2 diabetes mellitus with complication, without long-term current use of insulin ([facility]) Diabetes good control currently  Rash noted on breast I do not feel this in sign of cancer I recommend steroid cream twice daily as needed over the next 2 weeks if not dramatically improved then let us know we will help set up with dermatology   25 minutes was spent with the patient.  This statement  verifies that 25 minutes was indeed spent with the patient.  More than 50% of this visit-total duration of the visit-was spent in counseling and coordination of care. The issues that the patient came in for today as reflected in the diagnosis (s) please refer to documentation for further details.

## 2018-12-15 ENCOUNTER — Other Ambulatory Visit (HOSPITAL_COMMUNITY): Payer: Self-pay | Admitting: Family Medicine

## 2018-12-15 DIAGNOSIS — Z1231 Encounter for screening mammogram for malignant neoplasm of breast: Secondary | ICD-10-CM

## 2018-12-20 ENCOUNTER — Ambulatory Visit (HOSPITAL_COMMUNITY): Payer: 59

## 2018-12-20 ENCOUNTER — Ambulatory Visit (HOSPITAL_COMMUNITY)
Admission: RE | Admit: 2018-12-20 | Discharge: 2018-12-20 | Disposition: A | Discharge status: Home / Self Care | Payer: 59 | Source: Ambulatory Visit | Attending: Family Medicine | Admitting: Family Medicine

## 2018-12-20 ENCOUNTER — Other Ambulatory Visit: Payer: Self-pay

## 2018-12-20 DIAGNOSIS — Z1231 Encounter for screening mammogram for malignant neoplasm of breast: Secondary | ICD-10-CM | POA: Insufficient documentation

## 2018-12-23 MED FILL — NAPROXEN 500 MG TABLET: 500 | 30 days supply | Qty: 60 | Fill #0

## 2018-12-23 MED FILL — POTASSIUM CHLORIDE CRYS ER: 20 | 90 days supply | Qty: 180 | Fill #0

## 2019-01-06 ENCOUNTER — Other Ambulatory Visit: Payer: Self-pay | Admitting: Family Medicine

## 2019-01-06 ENCOUNTER — Other Ambulatory Visit: Payer: Self-pay | Admitting: *Deleted

## 2019-01-06 MED ORDER — ALLOPURINOL 100 MG PO TABS: ORAL_TABLET | ORAL | 1 refills | Status: DC

## 2019-01-07 MED FILL — ALLOPURINOL 100 MG TABS: 100 | 90 days supply | Qty: 180 | Fill #0

## 2019-01-19 ENCOUNTER — Other Ambulatory Visit: Payer: Self-pay | Admitting: Family Medicine

## 2019-01-19 MED FILL — LEVOTHYROXINE 200 MCG TAB: 200 | 90 days supply | Qty: 96 | Fill #1

## 2019-01-19 MED FILL — METOPROLOL SUCCINATE ER 50: 50 | 90 days supply | Qty: 90 | Fill #0

## 2019-01-20 MED FILL — COLCHICINE 0.6 MG TABS: 0.6 | 90 days supply | Qty: 90 | Fill #0

## 2019-01-31 ENCOUNTER — Other Ambulatory Visit: Payer: Self-pay | Admitting: Family Medicine

## 2019-02-01 MED FILL — NAPROXEN 500 MG TABLET: 500 | 30 days supply | Qty: 60 | Fill #0

## 2019-02-18 MED FILL — LISINOPRIL 2.5 MG TABLET: 2.5 | 90 days supply | Qty: 90 | Fill #1

## 2019-02-18 MED FILL — TORSEMIDE 20 MG TABLET: 20 | 90 days supply | Qty: 540 | Fill #1

## 2019-02-22 ENCOUNTER — Other Ambulatory Visit: Payer: Self-pay

## 2019-02-22 ENCOUNTER — Ambulatory Visit: Payer: 59 | Admitting: Family Medicine

## 2019-02-22 ENCOUNTER — Encounter: Payer: Self-pay | Admitting: Family Medicine

## 2019-02-22 VITALS — BP 118/74 | Temp 98.4°F | Ht 66.0 in | Wt 336.0 lb

## 2019-02-22 DIAGNOSIS — E7849 Other hyperlipidemia: Secondary | ICD-10-CM | POA: Diagnosis not present

## 2019-02-22 DIAGNOSIS — E119 Type 2 diabetes mellitus without complications: Secondary | ICD-10-CM

## 2019-02-22 DIAGNOSIS — R2241 Localized swelling, mass and lump, right lower limb: Secondary | ICD-10-CM

## 2019-02-22 DIAGNOSIS — G4700 Insomnia, unspecified: Secondary | ICD-10-CM

## 2019-02-22 DIAGNOSIS — R748 Abnormal levels of other serum enzymes: Secondary | ICD-10-CM

## 2019-02-22 DIAGNOSIS — I872 Venous insufficiency (chronic) (peripheral): Secondary | ICD-10-CM

## 2019-02-22 DIAGNOSIS — G4733 Obstructive sleep apnea (adult) (pediatric): Secondary | ICD-10-CM | POA: Diagnosis not present

## 2019-02-22 DIAGNOSIS — K760 Fatty (change of) liver, not elsewhere classified: Secondary | ICD-10-CM | POA: Diagnosis not present

## 2019-02-22 DIAGNOSIS — I10 Essential (primary) hypertension: Secondary | ICD-10-CM

## 2019-02-22 DIAGNOSIS — R21 Rash and other nonspecific skin eruption: Secondary | ICD-10-CM

## 2019-02-22 DIAGNOSIS — E039 Hypothyroidism, unspecified: Secondary | ICD-10-CM | POA: Diagnosis not present

## 2019-02-22 MED ORDER — TRIAMCINOLONE ACETONIDE 0.1 % EX CREA: TOPICAL_CREAM | CUTANEOUS | 4 refills | Status: DC

## 2019-02-22 MED ORDER — KETOCONAZOLE 2 % EX CREA: 1.0000 "application " | TOPICAL_CREAM | Freq: Two times a day (BID) | CUTANEOUS | 4 refills | Status: DC

## 2019-02-22 MED ORDER — TRULICITY 1.5 MG/0.5ML ~~LOC~~ SOAJ: SUBCUTANEOUS | 1 refills | Status: DC

## 2019-02-22 MED ORDER — METOPROLOL SUCCINATE ER 50 MG PO TB24: ORAL_TABLET | ORAL | 1 refills | Status: DC

## 2019-02-22 MED ORDER — ALPRAZOLAM 1 MG PO TABS: ORAL_TABLET | ORAL | 3 refills | Status: DC

## 2019-02-22 MED FILL — TRULICITY 1.5 MG/0.5 ML PEN: 1.5 | 28 days supply | Qty: 2 | Fill #0

## 2019-02-22 MED FILL — ALPRAZolam 1 MG TABS: 1 | 45 days supply | Qty: 90 | Fill #0

## 2019-02-22 MED FILL — TRIAMCINOLONE 0.1% CREAM: 0.1 | 15 days supply | Qty: 30 | Fill #0

## 2019-02-22 MED FILL — KETOCONAZOLE 2% CREAM: 2 | 14 days supply | Qty: 30 | Fill #0

## 2019-02-22 NOTE — Progress Notes (Signed)
Subjective:    Patient ID: Amy Hunter, female    DOB: 07/29/1960, 59 y.o.   MRN: [id number]  HPI burning in right lower leg and pt states she can feel a knot on her right lower leg. Felt the knot on jan 13th.  She relates that she felt this knot on her lower legs she was not sure what is going on with it she does have venous insufficiency she states is not tender red but not draining.  She also suffers with pedal edema bilateral. Rash on upper right leg.  This unusual rash started on her chest then went away then became on her upper leg she states is not causing any itching drainage or problems she just brought it to our attention been going on for the past several weeks  Would like refill on ketoconazole and triamcinolone cream she states carolyn prescribed for a female issue.   Needs refill on xanax.  She uses this medication to help her sleep at night.  She does have underlying diabetes she takes her medicine on a regular basis does not check her sugars frequently states overall she thinks are under good control  Patient's blood pressure doing well  Morbid obesity patient was encouraged to try to watch diet try to lose weight   Review of Systems  Constitutional: Negative for activity change, appetite change and fatigue.  HENT: Negative for congestion and rhinorrhea.   Respiratory: Negative for cough and shortness of breath.   Cardiovascular: Negative for chest pain and leg swelling.  Gastrointestinal: Negative for abdominal pain and diarrhea.  Endocrine: Negative for polydipsia and polyphagia.  Skin: Negative for color change.  Neurological: Negative for dizziness and weakness.  Psychiatric/Behavioral: Negative for behavioral problems and confusion.       Objective:   Physical Exam Vitals reviewed.  Constitutional:      General: She is not in acute distress. HENT:     Head: Normocephalic and atraumatic.  Eyes:     General:        Right eye: No discharge.      Left eye: No discharge.  Neck:     Trachea: No tracheal deviation.  Cardiovascular:     Rate and Rhythm: Normal rate and regular rhythm.     Heart sounds: Normal heart sounds. No murmur.  Pulmonary:     Effort: Pulmonary effort is normal. No respiratory distress.     Breath sounds: Normal breath sounds.  Lymphadenopathy:     Cervical: No cervical adenopathy.  Skin:    General: Skin is warm and dry.  Neurological:     Mental Status: She is alert.     Coordination: Coordination normal.  Psychiatric:        Behavior: Behavior normal.    She does have some pedal edema both sides but not severe she also has a home area in the dermis on the right inner aspect of the lower leg is approximately 1/2 inch by inch nontender not draining       Assessment & Plan:  1. Venous insufficiency I do not find any evidence of any type of blood clots going although we will do an ultrasound to look at this area on the right inner aspect of the leg hold off on any scans at this point - CBC with Differential/Platelet - Hepatic function panel - Basic metabolic panel - Hemoglobin A1c - Sedimentation rate  2. Obstructive sleep apnea Patient does have ongoing sleep apnea encourage use her machine -  CBC with Differential/Platelet - Hepatic function panel - Basic metabolic panel - Hemoglobin A1c - Sedimentation rate  3. Fatty liver History of fatty liver were checked and liver profile healthy diet regular activity trying to lose weight would be helpful - CBC with Differential/Platelet - Hepatic function panel - Basic metabolic panel - Hemoglobin A1c - Sedimentation rate  4. Hypothyroidism, unspecified type Hypothyroidism take her medication check thyroid function later this year more previous check looked good - CBC with Differential/Platelet - Hepatic function panel - Basic metabolic panel - Hemoglobin A1c - Sedimentation rate  5. Type 2 diabetes mellitus without complication, without  long-term current use of insulin ([facility]) Diabetes check A1c see what that shows watch diet closely continue medication - CBC with Differential/Platelet - Hepatic function panel - Basic metabolic panel - Hemoglobin A1c - Sedimentation rate  6. Morbid obesity ([facility]) Patient encouraged to try to lose weight difficult for her to do so - CBC with Differential/Platelet - Hepatic function panel - Basic metabolic panel - Hemoglobin A1c - Sedimentation rate  7. Other hyperlipidemia Continue medication check lab work watch diet previous labs reviewed - CBC with Differential/Platelet - Hepatic function panel - Basic metabolic panel - Hemoglobin A1c - Sedimentation rate  8. Elevated liver enzymes Hopefully liver enzymes look good check lab work await the results.  Fatty liver.  Very important for patient lose weight to lessen the risk of cirrhosis - CBC with Differential/Platelet - Hepatic function panel - Basic metabolic panel - Hemoglobin A1c - Sedimentation rate

## 2019-02-23 LAB — HEMOGLOBIN A1C
Est. average glucose Bld gHb Est-mCnc: 171 mg/dL
Hgb A1c MFr Bld: 7.6 % — ABNORMAL HIGH (ref 4.8–5.6)

## 2019-02-23 LAB — CBC WITH DIFFERENTIAL/PLATELET
Basophils Absolute: 0.1 10*3/uL (ref 0.0–0.2)
Basos: 1 %
EOS (ABSOLUTE): 0.3 10*3/uL (ref 0.0–0.4)
Eos: 4 %
Hematocrit: 42.9 % (ref 34.0–46.6)
Hemoglobin: 14.4 g/dL (ref 11.1–15.9)
Immature Grans (Abs): 0 10*3/uL (ref 0.0–0.1)
Immature Granulocytes: 0 %
Lymphocytes Absolute: 2.4 10*3/uL (ref 0.7–3.1)
Lymphs: 31 %
MCH: 28 pg (ref 26.6–33.0)
MCHC: 33.6 g/dL (ref 31.5–35.7)
MCV: 84 fL (ref 79–97)
Monocytes Absolute: 0.7 10*3/uL (ref 0.1–0.9)
Monocytes: 9 %
Neutrophils Absolute: 4.3 10*3/uL (ref 1.4–7.0)
Neutrophils: 55 %
Platelets: 177 10*3/uL (ref 150–450)
RBC: 5.14 x10E6/uL (ref 3.77–5.28)
RDW: 14.3 % (ref 11.7–15.4)
WBC: 7.9 10*3/uL (ref 3.4–10.8)

## 2019-02-23 LAB — BASIC METABOLIC PANEL
BUN/Creatinine Ratio: 21 (ref 9–23)
BUN: 16 mg/dL (ref 6–24)
CO2: 25 mmol/L (ref 20–29)
Calcium: 9.1 mg/dL (ref 8.7–10.2)
Chloride: 100 mmol/L (ref 96–106)
Creatinine, Ser: 0.75 mg/dL (ref 0.57–1.00)
GFR calc Af Amer: 101 mL/min/{1.73_m2} (ref >59)
GFR calc non Af Amer: 88 mL/min/{1.73_m2} (ref >59)
Glucose: 167 mg/dL — ABNORMAL HIGH (ref 65–99)
Potassium: 4.2 mmol/L (ref 3.5–5.2)
Sodium: 141 mmol/L (ref 134–144)

## 2019-02-23 LAB — HEPATIC FUNCTION PANEL
ALT: 93 IU/L — ABNORMAL HIGH (ref 0–32)
AST: 101 IU/L — ABNORMAL HIGH (ref 0–40)
Albumin: 4.3 g/dL (ref 3.8–4.9)
Alkaline Phosphatase: 198 IU/L — ABNORMAL HIGH (ref 39–117)
Bilirubin Total: 0.4 mg/dL (ref 0.0–1.2)
Bilirubin, Direct: 0.18 mg/dL (ref 0.00–0.40)
Total Protein: 6.6 g/dL (ref 6.0–8.5)

## 2019-02-23 LAB — SEDIMENTATION RATE: Sed Rate: 24 mm/hr (ref 0–40)

## 2019-02-25 MED ORDER — METFORMIN HCL 500 MG PO TABS: 250.0000 mg | ORAL_TABLET | Freq: Two times a day (BID) | ORAL | 5 refills | Status: DC

## 2019-02-25 NOTE — Addendum Note (Signed)
Addended by: Margaretha Sheffield on: 02/25/2019 11:44 AM   Modules accepted: Orders

## 2019-02-28 ENCOUNTER — Ambulatory Visit (HOSPITAL_COMMUNITY)
Admission: RE | Admit: 2019-02-28 | Discharge: 2019-02-28 | Disposition: A | Discharge status: Home / Self Care | Payer: 59 | Source: Ambulatory Visit | Attending: Family Medicine | Admitting: Family Medicine

## 2019-02-28 ENCOUNTER — Other Ambulatory Visit: Payer: Self-pay

## 2019-02-28 DIAGNOSIS — R2241 Localized swelling, mass and lump, right lower limb: Secondary | ICD-10-CM | POA: Insufficient documentation

## 2019-02-28 DIAGNOSIS — M79661 Pain in right lower leg: Secondary | ICD-10-CM | POA: Diagnosis not present

## 2019-03-14 ENCOUNTER — Other Ambulatory Visit: Payer: Self-pay | Admitting: Family Medicine

## 2019-03-14 MED FILL — PRAVASTATIN SODIUM 20 MG TA: 20 | 90 days supply | Qty: 90 | Fill #1

## 2019-03-14 MED FILL — NAPROXEN 500 MG TABLET: 500 | 30 days supply | Qty: 60 | Fill #0

## 2019-03-28 MED FILL — ALLOPURINOL 100 MG TABS: 100 | 90 days supply | Qty: 180 | Fill #1

## 2019-04-04 MED FILL — POTASSIUM CHLORIDE CRYS ER: 20 | 90 days supply | Qty: 180 | Fill #1

## 2019-04-11 MED FILL — METOPROLOL SUCCINATE ER 50: 50 | 90 days supply | Qty: 90 | Fill #1

## 2019-04-11 MED FILL — COLCHICINE 0.6 MG TABS: 0.6 | 90 days supply | Qty: 90 | Fill #1

## 2019-04-22 ENCOUNTER — Telehealth: Payer: Self-pay | Admitting: Family Medicine

## 2019-04-22 DIAGNOSIS — E7849 Other hyperlipidemia: Secondary | ICD-10-CM

## 2019-04-22 DIAGNOSIS — Z79899 Other long term (current) drug therapy: Secondary | ICD-10-CM

## 2019-04-22 DIAGNOSIS — E119 Type 2 diabetes mellitus without complications: Secondary | ICD-10-CM

## 2019-04-22 NOTE — Telephone Encounter (Signed)
Nurses Patient will be due for follow-up regarding her medical health issues by early June She will need lipid, liver, metabolic 7, urine ACR, A1c to be done late May with follow-up in early June Please notify patient we are reviewing chart review and she will need her follow-up lab work and office visit at that time sooner if any problems

## 2019-04-22 NOTE — Telephone Encounter (Signed)
Lab orders placed and pt is aware. Pt states she will call us back once she has her work schedule to set up an appt.

## 2019-04-22 NOTE — Addendum Note (Signed)
Addended by: Marlowe Shores on: 04/22/2019 09:30 AM   Modules accepted: Orders

## 2019-04-28 ENCOUNTER — Telehealth: Payer: Self-pay | Admitting: Family Medicine

## 2019-04-28 MED ORDER — DOXYCYCLINE HYCLATE 100 MG PO TABS: 100.0000 mg | ORAL_TABLET | Freq: Two times a day (BID) | ORAL | 0 refills | Status: DC

## 2019-04-28 NOTE — Telephone Encounter (Signed)
Doxycycline 100 mg 1 twice daily 10 days, warm compresses frequently, I would like to do a phone visit with the patient tomorrow if possible or in person if she would like

## 2019-04-28 NOTE — Telephone Encounter (Signed)
Patient stated her right leg above her knee got red last night and is a little painful but wants to catch it before it get bad. Patient states it is in the same spot it always is and she is at work today and cant come in. Patient requesting an antibiotic to [facility] in [place].

## 2019-04-28 NOTE — Telephone Encounter (Signed)
Prescription sent electronically to pharmacy. Patient scheduled telephone visit tomorrow pm with Dr Lorin Picket.

## 2019-04-28 NOTE — Telephone Encounter (Signed)
Patient states has cellulitis in her right leg again and wanting something called into [facility] [place]

## 2019-04-29 ENCOUNTER — Other Ambulatory Visit: Payer: Self-pay

## 2019-04-29 ENCOUNTER — Ambulatory Visit (INDEPENDENT_AMBULATORY_CARE_PROVIDER_SITE_OTHER): Payer: 59 | Admitting: Family Medicine

## 2019-04-29 DIAGNOSIS — L03119 Cellulitis of unspecified part of limb: Secondary | ICD-10-CM | POA: Diagnosis not present

## 2019-04-29 NOTE — Progress Notes (Signed)
   Subjective:    Patient ID: Amy Hunter, female    DOB: 07-24-1960, 59 y.o.   MRN: [id number]  HPICellulitis of right leg. Pt started on doxy 100mg  yesterday.  Significant cellulitis lower leg redness tenderness feeling warm to the touch denies high fever chills sweats wheezing difficulty breathing Virtual Visit via Telephone Note  I connected with on 04/29/19 at  4:10 PM EDT by telephone and verified that I am speaking with the correct person using two identifiers.  Location: Patient: home Provider: office   I discussed the limitations, risks, security and privacy concerns of performing an evaluation and management service by telephone and the availability of in person appointments. I also discussed with the patient that there may be a patient responsible charge related to this service. The patient expressed understanding and agreed to proceed.   History of Present Illness:    Observations/Objective:   Assessment and Plan:   Follow Up Instructions:    I discussed the assessment and treatment plan with the patient. The patient was provided an opportunity to ask questions and all were answered. The patient agreed with the plan and demonstrated an understanding of the instructions.   The patient was advised to call back or seek an in-person evaluation if the symptoms worsen or if the condition fails to improve as anticipated.  I provided 12 minutes of non-face-to-face time during this encounter.      Review of Systems     Objective:   Physical Exam   Virtual exam unable to do physical     Assessment & Plan:  Leg cellulitis Doxycycline twice daily for the next 10 days Warm compresses If progressive troubles or worse immediately call 05/01/19 we could work her into the schedule

## 2019-05-02 ENCOUNTER — Ambulatory Visit: Payer: 59 | Admitting: Family Medicine

## 2019-05-02 ENCOUNTER — Other Ambulatory Visit: Payer: Self-pay | Admitting: *Deleted

## 2019-05-02 MED FILL — LEVOTHYROXINE SODIUM 200 MC: 200 | 90 days supply | Qty: 96 | Fill #2

## 2019-05-02 NOTE — Telephone Encounter (Signed)
May have 90-day recommend follow-up office visit by her June

## 2019-05-03 MED ORDER — LEVOTHYROXINE SODIUM 200 MCG PO TABS: ORAL_TABLET | ORAL | 0 refills | Status: DC

## 2019-05-10 MED FILL — NAPROXEN 500 MG TABLET: 500 | 30 days supply | Qty: 60 | Fill #1

## 2019-05-23 ENCOUNTER — Ambulatory Visit: Payer: 59 | Admitting: Family Medicine

## 2019-05-23 ENCOUNTER — Encounter: Payer: Self-pay | Admitting: Family Medicine

## 2019-05-23 ENCOUNTER — Other Ambulatory Visit: Payer: Self-pay

## 2019-05-23 VITALS — BP 140/80 | HR 91 | Temp 98.2°F | Wt 324.4 lb

## 2019-05-23 DIAGNOSIS — M109 Gout, unspecified: Secondary | ICD-10-CM

## 2019-05-23 MED ORDER — LISINOPRIL 2.5 MG PO TABS: 2.5000 mg | ORAL_TABLET | Freq: Every morning | ORAL | 1 refills | Status: DC

## 2019-05-23 MED ORDER — PREDNISONE 20 MG PO TABS: 20.0000 mg | ORAL_TABLET | Freq: Every day | ORAL | 0 refills | Status: DC

## 2019-05-23 MED ORDER — TRAMADOL HCL 50 MG PO TABS: 50.0000 mg | ORAL_TABLET | Freq: Three times a day (TID) | ORAL | 0 refills | Status: AC | PRN

## 2019-05-23 MED FILL — LISINOPRIL 2.5 MG TABLET: 2.5 | 90 days supply | Qty: 90 | Fill #0

## 2019-05-23 NOTE — Progress Notes (Signed)
   Subjective:    Patient ID: Amy Hunter, female    DOB: 03/06/1960, 59 y.o.   MRN: [id number]  HPI Patient comes in today with complaints of right hand pain.  Patient states she hurt it Friday getting out of the shower and has not been able to use it since.  Needs refill on lisinopril sent to [facility].   Pt stating pain without trauma or injury.  Swelling over rt 1st mcp/2nd mcp. Mild redness.  Has had h/o gout.  No new changes in diet/meds.  No recent large alcohol intake, pt stating she doesn't drink.  No large amt of seafood or red meats. Very tender to touch just the skin.  Unable to make fist.  Has tried tylenol without much relief.     Review of Systems  Constitutional: Negative for chills and fever.  HENT: Negative for congestion, sinus pain and sore throat.   Eyes: Negative for pain, discharge, redness and itching.  Respiratory: Negative for cough.   Gastrointestinal: Negative for diarrhea, nausea and vomiting.  Musculoskeletal: Positive for joint swelling (rt hand pain).  Skin: Negative for rash and wound.   Vitals:   05/23/19 0923  BP: 140/80  Pulse: 91  Temp: 98.2 F (36.8 C)  SpO2: 96%      Objective:   Physical Exam Constitutional:      General: She is in acute distress.     Appearance: Normal appearance. She is not ill-appearing or toxic-appearing.  Musculoskeletal:        General: Swelling (rt 1st and 2nd mcp, with erythema and mild warmth.  no rash/lesions. dec rom, unable to make fist due to pain.) and tenderness (rt hand pain/1st mcp) present. No deformity or signs of injury.     Comments: Normal rt wrist rom. Nvi, cap refill nl. Radial pulse +2  Skin:    General: Skin is warm and dry.     Findings: No rash.  Neurological:     General: No focal deficit present.     Mental Status: She is alert and oriented to person, place, and time.        Assessment & Plan:   1. Acute gout of right hand, unspecified cause - predniSONE (DELTASONE) 20 MG  tablet; Take 1 tablet (20 mg total) by mouth daily with breakfast.  Dispense: 10 tablet; Refill: 0 - traMADol (ULTRAM) 50 MG tablet; Take 1 tablet (50 mg total) by mouth every 8 (eight) hours as needed for up to 5 days.  Dispense: 15 tablet; Refill: 0  Advising to take 1 more colchicine tablet when getting home and repeat 1 tablet in 1 hr.  Take prednisone until finished. Advised to take with food.  Pt may add ibuprofen if she's able to tolerate NSAIDs. Unable to take indocin, due to dizziness and nausea. Tramadol as needed for severe pain. Cool compress prn.  F/u in 5-7 days if not improving or worsening symptoms. Pt in agreement.

## 2019-05-27 ENCOUNTER — Ambulatory Visit: Payer: Self-pay

## 2019-05-27 ENCOUNTER — Other Ambulatory Visit: Payer: Self-pay | Admitting: Occupational Medicine

## 2019-05-27 ENCOUNTER — Other Ambulatory Visit: Payer: Self-pay

## 2019-05-27 DIAGNOSIS — M79641 Pain in right hand: Secondary | ICD-10-CM

## 2019-06-03 ENCOUNTER — Other Ambulatory Visit: Payer: Self-pay | Admitting: Family Medicine

## 2019-06-03 NOTE — Telephone Encounter (Signed)
Last med check up 11/22/18

## 2019-06-04 NOTE — Telephone Encounter (Signed)
1 refill needs office visit 

## 2019-06-06 MED FILL — TORSEMIDE 20 MG TABLET: 20 | 90 days supply | Qty: 540 | Fill #0

## 2019-06-06 NOTE — Telephone Encounter (Signed)
Scheduled 5/14 with Eber Jones.

## 2019-06-06 NOTE — Telephone Encounter (Signed)
Please schedule and then route back to send in refills 

## 2019-06-17 ENCOUNTER — Other Ambulatory Visit: Payer: Self-pay

## 2019-06-17 ENCOUNTER — Ambulatory Visit: Payer: 59 | Admitting: Nurse Practitioner

## 2019-06-17 VITALS — BP 128/84 | Temp 97.6°F | Wt 329.6 lb

## 2019-06-17 DIAGNOSIS — K760 Fatty (change of) liver, not elsewhere classified: Secondary | ICD-10-CM

## 2019-06-17 DIAGNOSIS — E119 Type 2 diabetes mellitus without complications: Secondary | ICD-10-CM | POA: Diagnosis not present

## 2019-06-17 DIAGNOSIS — M109 Gout, unspecified: Secondary | ICD-10-CM

## 2019-06-17 DIAGNOSIS — E7849 Other hyperlipidemia: Secondary | ICD-10-CM | POA: Diagnosis not present

## 2019-06-17 DIAGNOSIS — R748 Abnormal levels of other serum enzymes: Secondary | ICD-10-CM | POA: Diagnosis not present

## 2019-06-17 NOTE — Patient Instructions (Signed)
Allegra or Claritin in the morning Nasacort AQ as directed Zaditor eye drops

## 2019-06-17 NOTE — Progress Notes (Signed)
   Subjective:    Patient ID: Amy Hunter, female    DOB: [birth year], 59 y.o.   MRN: [id number]  HPI Patient comes in today for a medication follow up.   Patient is requesting a refill on her Albuterol inhaler used as needed.   Patient would like to discuss increased allergy symptoms and a recommended nose spray and allergy med.  Presents for recheck of her chronic health problems.  No recent changes in her diet.  No regular exercise but tries to stay active taking care of her adopted children. Tolerating Trulicity with no nausea vomiting.  Sugars at home averaging less than 150 most of the time.  Reflux overall well controlled, has mild issues 2-3 times per week.  Adherent to medication regimen.  No chest pain/ischemic type pain shortness of breath or edema. Also complaints of a flareup of her allergies with the recent pollen.  Requesting a refill on her albuterol inhaler, rare use, her current inhaler is outdated.  No current wheezing.  Slight irritated scratchy throat.  Rare cough.  No ear pain.  Occasional mild right sinus area pressure.  Symptoms are relieved with OTC meds but would like to use other medications to help her allergies.  Gets regular preventive health physicals.  Most recent labs 02/22/2019.  Had a recent flareup of her gout in her right finger, completed course of prednisone, symptoms have resolved. Review of Systems     Objective:   Physical Exam NAD.  Alert, oriented.  Thyroid nontender, no mass or goiter noted. Lungs clear.  Heart regular rate rhythm.  Fingers on right hand no edema or erythema noted. See labs dated 02/22/2019. Today's Vitals   06/17/19 0928  BP: 128/84  Temp: 97.6 F (36.4 C)  Weight: (!) 329 lb 9.6 oz (149.5 kg)   Body mass index is 53.2 kg/m.     Assessment & Plan:   Problem List Items Addressed This Visit      Digestive   Fatty liver     Endocrine   Type 2 diabetes mellitus without complication (HCC) - Primary     Other   Acute  gout   Elevated liver enzymes   Hyperlipidemia   Morbid obesity (HCC)     Continue current medications as directed.  Encouraged healthy diet regular activity and weight loss. Encourage patient to get lab work done as previously ordered. Allegra or Claritin in the morning Nasacort AQ as directed Zaditor eye drops Meds ordered this encounter  Medications  . albuterol (VENTOLIN HFA) 108 (90 Base) MCG/ACT inhaler    Sig: Inhale 2 puffs into the lungs every 4 (four) hours as needed for wheezing.    Dispense:  18 g    Refill:  0    Order Specific Question:   Supervising Provider    Answer:   Lilyan Punt A [[id number]]   Given refill on albuterol inhaler to have on hand in case of wheezing. Return in about 6 months (around 12/18/2019).

## 2019-06-18 ENCOUNTER — Encounter: Payer: Self-pay | Admitting: Nurse Practitioner

## 2019-06-18 MED ORDER — ALBUTEROL SULFATE HFA 108 (90 BASE) MCG/ACT IN AERS: 2.0000 | INHALATION_SPRAY | RESPIRATORY_TRACT | 0 refills | Status: DC | PRN

## 2019-06-20 MED FILL — ALBUTEROL SULFATE HFA 108 (: 108 (90 BAS | 16 days supply | Qty: 18 | Fill #0

## 2019-06-21 DIAGNOSIS — Z79899 Other long term (current) drug therapy: Secondary | ICD-10-CM | POA: Diagnosis not present

## 2019-06-21 DIAGNOSIS — E119 Type 2 diabetes mellitus without complications: Secondary | ICD-10-CM | POA: Diagnosis not present

## 2019-06-21 DIAGNOSIS — E7849 Other hyperlipidemia: Secondary | ICD-10-CM | POA: Diagnosis not present

## 2019-06-22 LAB — LIPID PANEL
Chol/HDL Ratio: 4 ratio (ref 0.0–4.4)
Cholesterol, Total: 164 mg/dL (ref 100–199)
HDL: 41 mg/dL (ref >39)
LDL Chol Calc (NIH): 101 mg/dL — ABNORMAL HIGH (ref 0–99)
Triglycerides: 125 mg/dL (ref 0–149)
VLDL Cholesterol Cal: 22 mg/dL (ref 5–40)

## 2019-06-22 LAB — BASIC METABOLIC PANEL
BUN/Creatinine Ratio: 27 — ABNORMAL HIGH (ref 9–23)
BUN: 21 mg/dL (ref 6–24)
CO2: 25 mmol/L (ref 20–29)
Calcium: 9.3 mg/dL (ref 8.7–10.2)
Chloride: 101 mmol/L (ref 96–106)
Creatinine, Ser: 0.78 mg/dL (ref 0.57–1.00)
GFR calc Af Amer: 96 mL/min/{1.73_m2} (ref >59)
GFR calc non Af Amer: 83 mL/min/{1.73_m2} (ref >59)
Glucose: 156 mg/dL — ABNORMAL HIGH (ref 65–99)
Potassium: 4.8 mmol/L (ref 3.5–5.2)
Sodium: 142 mmol/L (ref 134–144)

## 2019-06-22 LAB — MICROALBUMIN / CREATININE URINE RATIO
Creatinine, Urine: 72.2 mg/dL
Microalb/Creat Ratio: 11 mg/g creat (ref 0–29)
Microalbumin, Urine: 8 ug/mL

## 2019-06-22 LAB — HEPATIC FUNCTION PANEL
ALT: 97 IU/L — ABNORMAL HIGH (ref 0–32)
AST: 81 IU/L — ABNORMAL HIGH (ref 0–40)
Albumin: 3.9 g/dL (ref 3.8–4.9)
Alkaline Phosphatase: 164 IU/L — ABNORMAL HIGH (ref 48–121)
Bilirubin Total: 0.4 mg/dL (ref 0.0–1.2)
Bilirubin, Direct: 0.16 mg/dL (ref 0.00–0.40)
Total Protein: 6.4 g/dL (ref 6.0–8.5)

## 2019-06-22 LAB — HEMOGLOBIN A1C
Est. average glucose Bld gHb Est-mCnc: 151 mg/dL
Hgb A1c MFr Bld: 6.9 % — ABNORMAL HIGH (ref 4.8–5.6)

## 2019-06-24 ENCOUNTER — Telehealth: Payer: Self-pay | Admitting: *Deleted

## 2019-06-24 ENCOUNTER — Other Ambulatory Visit: Payer: Self-pay | Admitting: Nurse Practitioner

## 2019-06-24 ENCOUNTER — Other Ambulatory Visit: Payer: Self-pay | Admitting: *Deleted

## 2019-06-24 DIAGNOSIS — Z79899 Other long term (current) drug therapy: Secondary | ICD-10-CM

## 2019-06-24 DIAGNOSIS — R748 Abnormal levels of other serum enzymes: Secondary | ICD-10-CM

## 2019-06-24 DIAGNOSIS — E119 Type 2 diabetes mellitus without complications: Secondary | ICD-10-CM

## 2019-06-24 DIAGNOSIS — E785 Hyperlipidemia, unspecified: Secondary | ICD-10-CM

## 2019-06-24 MED ORDER — PRAVASTATIN SODIUM 40 MG PO TABS: 40.0000 mg | ORAL_TABLET | Freq: Every day | ORAL | 1 refills | Status: DC

## 2019-06-24 MED ORDER — TRULICITY 3 MG/0.5ML ~~LOC~~ SOAJ: SUBCUTANEOUS | 2 refills | Status: DC

## 2019-06-24 MED FILL — TRULICITY 3 MG/0.5ML SOPN: 3 | 28 days supply | Qty: 2 | Fill #0

## 2019-06-24 MED FILL — PRAVASTATIN NA 40 MG TAB: 40 | 90 days supply | Qty: 90 | Fill #0

## 2019-06-24 NOTE — Telephone Encounter (Signed)
Pt states carolyn talked to her at last visit about increasing trulicity and she would like to go ahead and do that [facility]

## 2019-06-24 NOTE — Telephone Encounter (Signed)
Done

## 2019-06-28 ENCOUNTER — Other Ambulatory Visit: Payer: Self-pay | Admitting: Family Medicine

## 2019-06-28 MED FILL — ALLOPURINOL 100 MG TABS: 100 | 90 days supply | Qty: 180 | Fill #0

## 2019-07-06 ENCOUNTER — Other Ambulatory Visit: Payer: Self-pay | Admitting: Family Medicine

## 2019-07-07 ENCOUNTER — Other Ambulatory Visit: Payer: Self-pay | Admitting: Family Medicine

## 2019-07-07 MED FILL — COLCHICINE 0.6 MG TABS: 0.6 | 90 days supply | Qty: 90 | Fill #0

## 2019-07-07 MED FILL — NAPROXEN 500 MG TABLET: 500 | 30 days supply | Qty: 60 | Fill #0

## 2019-07-07 MED FILL — POTASSIUM CHLORIDE CRYS ER: 20 | 90 days supply | Qty: 180 | Fill #0

## 2019-07-07 NOTE — Telephone Encounter (Signed)
6 months on medications

## 2019-07-12 ENCOUNTER — Encounter: Payer: Self-pay | Admitting: Family Medicine

## 2019-07-13 ENCOUNTER — Encounter: Payer: Self-pay | Admitting: Internal Medicine

## 2019-07-14 ENCOUNTER — Other Ambulatory Visit: Payer: Self-pay | Admitting: Family Medicine

## 2019-07-14 DIAGNOSIS — I10 Essential (primary) hypertension: Secondary | ICD-10-CM

## 2019-07-15 ENCOUNTER — Other Ambulatory Visit: Payer: Self-pay | Admitting: Family Medicine

## 2019-07-25 MED FILL — TRULICITY 3 MG/0.5ML SOPN: 3 | 28 days supply | Qty: 2 | Fill #1

## 2019-08-01 ENCOUNTER — Telehealth: Payer: Self-pay | Admitting: Family Medicine

## 2019-08-01 NOTE — Telephone Encounter (Signed)
Patient has psoriasis on her back that is itching and bothering her.  No available appts for the next few days.  Patient thought she had been treated for this before but can't remember.  She wasn't sure if there was a cream or something she could use to help with the itching until she can get in here for an appt?

## 2019-08-02 ENCOUNTER — Other Ambulatory Visit: Payer: Self-pay | Admitting: *Deleted

## 2019-08-02 MED ORDER — MOMETASONE FUROATE 0.1 % EX CREA: TOPICAL_CREAM | CUTANEOUS | 3 refills | Status: DC

## 2019-08-02 NOTE — Telephone Encounter (Signed)
Elocon cream applied twice daily as needed, 90 g, 3 refills Follow-up this summer recommended

## 2019-08-02 NOTE — Telephone Encounter (Signed)
Patient notified and rx sent. 

## 2019-08-08 MED FILL — LEVOTHYROXINE SODIUM 200 MC: 200 | 90 days supply | Qty: 96 | Fill #0

## 2019-08-08 MED FILL — NAPROXEN 500 MG TABLET: 500 | 30 days supply | Qty: 60 | Fill #1

## 2019-08-15 ENCOUNTER — Telehealth: Payer: Self-pay | Admitting: Family Medicine

## 2019-08-15 NOTE — Telephone Encounter (Signed)
Left message to return call 

## 2019-08-15 NOTE — Telephone Encounter (Signed)
Difficult to know what to prescribe without seeing the rash I would recommend an office visit tomorrow afternoon, in office

## 2019-08-15 NOTE — Telephone Encounter (Signed)
Pt contacted. Pt states she has a rash on back, stomach and legs. Pt states they look like little red spots; no drainage, no pain but some itching. Elocon cream called in on 08/01/19 for psoriasis but pt states that cream is not helping. Pt not having any other symptoms. Pt unable to come to office today due to having 2 kids with her at home. Please advise. Thank you

## 2019-08-15 NOTE — Telephone Encounter (Signed)
Pt calling stating mometasone (ELOCON) 0.1 % cream   is not helping. Her rash is spreading and would like to know what she should try now. Pt can be seen tomorrow afternoon or Wednesday afternoon. All we have available is same days at this time.   [facility] [place]

## 2019-08-15 NOTE — Telephone Encounter (Signed)
Patient notified and scheduled follow up office visit tomorrow wit Dr Lorin Picket

## 2019-08-16 ENCOUNTER — Encounter: Payer: Self-pay | Admitting: Family Medicine

## 2019-08-16 ENCOUNTER — Other Ambulatory Visit: Payer: Self-pay

## 2019-08-16 ENCOUNTER — Ambulatory Visit: Payer: 59 | Admitting: Family Medicine

## 2019-08-16 VITALS — BP 130/86 | Temp 97.6°F | Wt 318.6 lb

## 2019-08-16 DIAGNOSIS — E119 Type 2 diabetes mellitus without complications: Secondary | ICD-10-CM | POA: Diagnosis not present

## 2019-08-16 DIAGNOSIS — R21 Rash and other nonspecific skin eruption: Secondary | ICD-10-CM | POA: Diagnosis not present

## 2019-08-16 DIAGNOSIS — E039 Hypothyroidism, unspecified: Secondary | ICD-10-CM

## 2019-08-16 MED ORDER — PREDNISONE 20 MG PO TABS
ORAL_TABLET | ORAL | 0 refills | Status: DC
Start: 2019-08-16 — End: 2020-01-03

## 2019-08-16 MED ORDER — HALOBETASOL PROPIONATE 0.05 % EX CREA
TOPICAL_CREAM | Freq: Two times a day (BID) | CUTANEOUS | 1 refills | Status: DC
Start: 2019-08-16 — End: 2020-01-03

## 2019-08-16 NOTE — Progress Notes (Signed)
   Subjective:    Patient ID: Amy Hunter, female    DOB: 05/07/1960, 59 y.o.   MRN: [id number]  Rash This is a new problem. Episode onset: 2 weeks. Location: back, stomach and legs. The rash is characterized by itchiness (patch on back is the only area that itches. ). Treatments tried: prescription cream. The treatment provided mild relief.    Significant rash started off a small area is now large patch on her back also relates some on her arms and legs.  Has history of psoriasis  Review of Systems  Skin: Positive for rash.       Objective:   Physical Exam  Combination of psoriasis along with erythematous rash no sign of cellulitis.      Assessment & Plan:  Psoriasis Possible atopic dermatitis Short course prednisone Ultravate cream apply twice daily as needed Referral to dermatology  Follow-up for regular health issues later this fall

## 2019-08-29 ENCOUNTER — Other Ambulatory Visit: Payer: Self-pay | Admitting: Family Medicine

## 2019-08-29 MED FILL — LISINOPRIL 2.5 MG TABLET: 2.5 | 90 days supply | Qty: 90 | Fill #1

## 2019-08-29 MED FILL — TRULICITY 3 MG/0.5ML SOPN: 3 | 28 days supply | Qty: 2 | Fill #2

## 2019-08-31 ENCOUNTER — Ambulatory Visit: Payer: 59 | Admitting: Gastroenterology

## 2019-08-31 MED FILL — TORSEMIDE 20 MG TABLET: 20 | 90 days supply | Qty: 540 | Fill #0

## 2019-09-01 ENCOUNTER — Encounter: Payer: Self-pay | Admitting: Family Medicine

## 2019-09-11 ENCOUNTER — Other Ambulatory Visit: Payer: Self-pay | Admitting: Family Medicine

## 2019-09-12 MED FILL — NAPROXEN 500 MG TABLET: 500 | 30 days supply | Qty: 60 | Fill #2

## 2019-09-23 ENCOUNTER — Telehealth: Payer: Self-pay | Admitting: Family Medicine

## 2019-09-23 ENCOUNTER — Encounter: Payer: Self-pay | Admitting: Nurse Practitioner

## 2019-09-23 NOTE — Telephone Encounter (Signed)
Please advise. Thank you

## 2019-09-23 NOTE — Telephone Encounter (Signed)
Pt states she has a sinus infection and wants recommendation of what to take. Pt advised we would not be able to give antibiotic with out being seen. Pt states she had covid test yesterday through work and it was negative.

## 2019-09-23 NOTE — Telephone Encounter (Signed)
Message sent through my chart

## 2019-09-26 ENCOUNTER — Other Ambulatory Visit: Payer: Self-pay

## 2019-09-26 ENCOUNTER — Telehealth (INDEPENDENT_AMBULATORY_CARE_PROVIDER_SITE_OTHER): Payer: 59 | Admitting: Family Medicine

## 2019-09-26 ENCOUNTER — Telehealth: Payer: Self-pay | Admitting: Family Medicine

## 2019-09-26 DIAGNOSIS — U071 COVID-19: Secondary | ICD-10-CM | POA: Diagnosis not present

## 2019-09-26 MED ORDER — ONDANSETRON HCL 8 MG PO TABS
8.0000 mg | ORAL_TABLET | Freq: Three times a day (TID) | ORAL | 0 refills | Status: DC | PRN
Start: 2019-09-26 — End: 2020-01-03

## 2019-09-26 NOTE — Telephone Encounter (Signed)
[facility]

## 2019-09-26 NOTE — Progress Notes (Signed)
   Subjective:    Patient ID: Amy Hunter, female    DOB: April 19, 1960, 59 y.o.   MRN: [id number]  HPIdiscuss covid risk factors. Pt would like to have some nausea meds.  Patient with some nausea No vomiting or diarrhea Little bit of runny nose Diagnosed with Covid just recently Denies difficulty breathing denies any severe pain or headaches PMH diabetes morbid obesity Virtual Visit via Telephone Note  I connected with Amy Hunter on 09/26/19 at  4:30 PM EDT by telephone and verified that I am speaking with the correct person using two identifiers.  Location: Patient: home Provider: office   I discussed the limitations, risks, security and privacy concerns of performing an evaluation and management service by telephone and the availability of in person appointments. I also discussed with the patient that there may be a patient responsible charge related to this service. The patient expressed understanding and agreed to proceed.   History of Present Illness:    Observations/Objective:   Assessment and Plan:   Follow Up Instructions:    I discussed the assessment and treatment plan with the patient. The patient was provided an opportunity to ask questions and all were answered. The patient agreed with the plan and demonstrated an understanding of the instructions.   The patient was advised to call back or seek an in-person evaluation if the symptoms worsen or if the condition fails to improve as anticipated.  I provided 20 minutes of non-face-to-face time during this encounter.       Review of Systems Please see above positive for nausea runny nose cough denies shortness of breath or chest pain    Objective:   Physical Exam Today's visit was via telephone Physical exam was not possible for this visit        Assessment & Plan:  Covid diagnosis Warning signs were discussed I recommend antibody infusion Referral was made on there answering machine  phone  Follow-up if progressive troubles We will send in medication for nausea

## 2019-09-26 NOTE — Telephone Encounter (Signed)
Pt returned call and verbalized understanding. Pt placed on schedule for today at 4:30 pm.

## 2019-09-26 NOTE — Telephone Encounter (Signed)
[facility]

## 2019-09-26 NOTE — Telephone Encounter (Signed)
Pt has tested positive for covid on Saturday. She is wanting to know if she can have something called in for nausea. Also pt has received 1st vaccine but was suppose to get 2nd dose Saturday but was unable to she would like to know when she can get the 2nd dose.

## 2019-09-26 NOTE — Telephone Encounter (Signed)
The second dose will need to be delayed until 90 days after her Covid infection This patient needs a virtual visit with me at 430 to discuss Covid because of her risk factors

## 2019-09-26 NOTE — Telephone Encounter (Signed)
Please advise. Thank you

## 2019-09-27 ENCOUNTER — Telehealth (HOSPITAL_COMMUNITY): Payer: Self-pay | Admitting: Hospice and Palliative Medicine

## 2019-09-27 NOTE — Telephone Encounter (Signed)
I called to discuss with patient about Covid-19 symptoms and the use of regeneron, a monoclonal antibody infusion for those with mild to moderate Covid-19 symptoms and at a high risk of hospitalization.     Pt is qualified for this infusion at the [facility] due to co-morbid conditions and/or a member of an at-risk group.     I reviewed information on monoclonal antibody infusion with patient.  She is symptomatic as of 8/17 and would still be within the window of efficacy.  She tested positive on 8/19.  She says that she wants to think about it and will call us back if interested but feels like she is overall improving only with minor residual symptoms.  Laurette Schimke, PhD, NP-C [phone] (Infusion Center Hotline)

## 2019-09-27 NOTE — Progress Notes (Signed)
09/27/19 I called pt and notified her that her nausea med was sent to [facility]. She wanted to let dr scott know she felt about 95% better today.

## 2019-10-12 MED FILL — COLCHICINE 0.6 MG TABS: 0.6 | 90 days supply | Qty: 90 | Fill #1

## 2019-10-12 MED FILL — NAPROXEN 500 MG TABLET: 500 | 30 days supply | Qty: 60 | Fill #3

## 2019-10-12 MED FILL — POTASSIUM CHLORIDE CRYS ER: 20 | 90 days supply | Qty: 180 | Fill #1

## 2019-10-12 MED FILL — ALLOPURINOL 100 MG TABS: 100 | 90 days supply | Qty: 180 | Fill #1

## 2019-10-12 MED FILL — PRAVASTATIN NA 40 MG TAB: 40 | 90 days supply | Qty: 90 | Fill #1

## 2019-10-24 ENCOUNTER — Ambulatory Visit: Payer: 59 | Admitting: Gastroenterology

## 2019-10-27 DIAGNOSIS — L400 Psoriasis vulgaris: Secondary | ICD-10-CM | POA: Diagnosis not present

## 2019-10-31 ENCOUNTER — Other Ambulatory Visit: Payer: Self-pay | Admitting: Nurse Practitioner

## 2019-10-31 MED FILL — METOPROLOL SUCCINATE ER 50: 50 | 90 days supply | Qty: 90 | Fill #1

## 2019-10-31 MED FILL — TRULICITY 3 MG/0.5ML SOPN: 3 | 28 days supply | Qty: 2 | Fill #0

## 2019-11-11 ENCOUNTER — Telehealth: Payer: Self-pay | Admitting: Pharmacist

## 2019-11-11 NOTE — Telephone Encounter (Signed)
Called patient to schedule an appointment for the [facility]. I was unable to reach the patient so I left a HIPAA-compliant message requesting that the patient return my call.   

## 2019-11-28 ENCOUNTER — Other Ambulatory Visit: Payer: Self-pay | Admitting: Family Medicine

## 2019-11-28 MED FILL — NAPROXEN 500 MG TABLET: 500 | 30 days supply | Qty: 60 | Fill #4

## 2019-11-28 MED FILL — LEVOTHYROXINE SODIUM 200 MC: 200 | 90 days supply | Qty: 96 | Fill #0

## 2019-12-12 ENCOUNTER — Other Ambulatory Visit: Payer: Self-pay | Admitting: Family Medicine

## 2019-12-12 MED FILL — LISINOPRIL 2.5 MG TABLET: 2.5 | 90 days supply | Qty: 90 | Fill #0

## 2019-12-13 ENCOUNTER — Other Ambulatory Visit: Payer: Self-pay | Admitting: Family Medicine

## 2019-12-13 MED FILL — TORSEMIDE 20 MG TABLET: 20 | 90 days supply | Qty: 540 | Fill #0

## 2020-01-03 ENCOUNTER — Encounter: Payer: Self-pay | Admitting: Family Medicine

## 2020-01-03 ENCOUNTER — Other Ambulatory Visit: Payer: Self-pay | Admitting: Family Medicine

## 2020-01-03 ENCOUNTER — Ambulatory Visit: Payer: 59 | Admitting: Family Medicine

## 2020-01-03 VITALS — BP 132/80 | HR 93 | Temp 97.5°F | Ht 66.0 in | Wt 312.0 lb

## 2020-01-03 DIAGNOSIS — M109 Gout, unspecified: Secondary | ICD-10-CM

## 2020-01-03 DIAGNOSIS — E119 Type 2 diabetes mellitus without complications: Secondary | ICD-10-CM

## 2020-01-03 DIAGNOSIS — Z79899 Other long term (current) drug therapy: Secondary | ICD-10-CM | POA: Diagnosis not present

## 2020-01-03 DIAGNOSIS — E039 Hypothyroidism, unspecified: Secondary | ICD-10-CM

## 2020-01-03 DIAGNOSIS — I10 Essential (primary) hypertension: Secondary | ICD-10-CM | POA: Diagnosis not present

## 2020-01-03 DIAGNOSIS — L209 Atopic dermatitis, unspecified: Secondary | ICD-10-CM | POA: Diagnosis not present

## 2020-01-03 DIAGNOSIS — E7849 Other hyperlipidemia: Secondary | ICD-10-CM | POA: Diagnosis not present

## 2020-01-03 MED ORDER — PRAVASTATIN SODIUM 40 MG PO TABS: 40.0000 mg | ORAL_TABLET | Freq: Every day | ORAL | 1 refills | Status: DC

## 2020-01-03 MED ORDER — METFORMIN HCL 500 MG PO TABS: ORAL_TABLET | ORAL | 1 refills | Status: DC

## 2020-01-03 MED ORDER — NAPROXEN 500 MG PO TABS: ORAL_TABLET | ORAL | 1 refills | Status: DC

## 2020-01-03 MED ORDER — LISINOPRIL 2.5 MG PO TABS: 2.5000 mg | ORAL_TABLET | Freq: Every morning | ORAL | 1 refills | Status: DC

## 2020-01-03 MED ORDER — METOPROLOL SUCCINATE ER 50 MG PO TB24: ORAL_TABLET | ORAL | 1 refills | Status: DC

## 2020-01-03 MED ORDER — POTASSIUM CHLORIDE CRYS ER 20 MEQ PO TBCR: EXTENDED_RELEASE_TABLET | ORAL | 1 refills | Status: DC

## 2020-01-03 MED ORDER — LEVOTHYROXINE SODIUM 200 MCG PO TABS: ORAL_TABLET | ORAL | 1 refills | Status: DC

## 2020-01-03 MED ORDER — TORSEMIDE 20 MG PO TABS: ORAL_TABLET | ORAL | 1 refills | Status: DC

## 2020-01-03 MED ORDER — ALLOPURINOL 100 MG PO TABS: ORAL_TABLET | ORAL | 1 refills | Status: DC

## 2020-01-03 MED ORDER — PREDNISONE 20 MG PO TABS: 20.0000 mg | ORAL_TABLET | Freq: Every day | ORAL | 0 refills | Status: DC

## 2020-01-03 MED ORDER — TRULICITY 3 MG/0.5ML ~~LOC~~ SOAJ: SUBCUTANEOUS | 5 refills | Status: DC

## 2020-01-03 MED FILL — NAPROXEN 500 MG TABLET: 500 | 90 days supply | Qty: 180 | Fill #0

## 2020-01-03 MED FILL — POTASSIUM CHLORIDE CRYS ER: 20 | 90 days supply | Qty: 180 | Fill #0

## 2020-01-03 MED FILL — METFORMIN HCL 500 MG TABS: 500 | 90 days supply | Qty: 90 | Fill #0

## 2020-01-03 MED FILL — ALLOPURINOL 100 MG TABS: 100 | 90 days supply | Qty: 180 | Fill #0

## 2020-01-03 MED FILL — PRAVASTATIN NA 40 MG TAB: 40 | 90 days supply | Qty: 90 | Fill #0

## 2020-01-03 MED FILL — TRULICITY 3 MG/0.5ML SOPN: 3 | 28 days supply | Qty: 2 | Fill #0

## 2020-01-03 NOTE — Patient Instructions (Signed)
Keep an eye on your blood sugar while on prednisone for the rash Follow-up with dermatology if the rash does not improve  Prednisone is an anti-inflammatory medication. It is best to only use it for short periods of time. It can cause  your blood pressure to increase and it can cause your blood sugar to increase. If you are a diabetic, please check your blood sugar twice per day while you are taking prednisone. If your sugars are > 200 or < 70 please seek medical attention right away.        Rash, Adult  A rash is a change in the color of your skin. A rash can also change the way your skin feels. There are many different conditions and factors that can cause a rash. Follow these instructions at home: The goal of treatment is to stop the itching and keep the rash from spreading. Watch for any changes in your symptoms. Let your doctor know about them. Follow these instructions to help with your condition: Medicine Take or apply over-the-counter and prescription medicines only as told by your doctor. These may include medicines:  To treat red or swollen skin (corticosteroid creams).  To treat itching.  To treat an allergy (oral antihistamines).  To treat very bad symptoms (oral corticosteroids).  Skin care  Put cool cloths (compresses) on the affected areas.  Do not scratch or rub your skin.  Avoid covering the rash. Make sure that the rash is exposed to air as much as possible. Managing itching and discomfort  Avoid hot showers or baths. These can make itching worse. A cold shower may help.  Try taking a bath with: ? Epsom salts. You can get these at your local pharmacy or grocery store. Follow the instructions on the package. ? Baking soda. Pour a small amount into the bath as told by your doctor. ? Colloidal oatmeal. You can get this at your local pharmacy or grocery store. Follow the instructions on the package.  Try putting baking soda paste onto your skin. Stir water into  baking soda until it gets like a paste.  Try putting on a lotion that relieves itchiness (calamine lotion).  Keep cool and out of the sun. Sweating and being hot can make itching worse. General instructions   Rest as needed.  Drink enough fluid to keep your pee (urine) pale yellow.  Wear loose-fitting clothing.  Avoid scented soaps, detergents, and perfumes. Use gentle soaps, detergents, perfumes, and other cosmetic products.  Avoid anything that causes your rash. Keep a journal to help track what causes your rash. Write down: ? What you eat. ? What cosmetic products you use. ? What you drink. ? What you wear. This includes jewelry.  Keep all follow-up visits as told by your doctor. This is important. Contact a doctor if:  You sweat at night.  You lose weight.  You pee (urinate) more than normal.  You pee less than normal, or you notice that your pee is a darker color than normal.  You feel weak.  You throw up (vomit).  Your skin or the whites of your eyes look yellow (jaundice).  Your skin: ? Tingles. ? Is numb.  Your rash: ? Does not go away after a few days. ? Gets worse.  You are: ? More thirsty than normal. ? More tired than normal.  You have: ? New symptoms. ? Pain in your belly (abdomen). ? A fever. ? Watery poop (diarrhea). Get help right away if:  You  have a fever and your symptoms suddenly get worse.  You start to feel mixed up (confused).  You have a very bad headache or a stiff neck.  You have very bad joint pains or stiffness.  You have jerky movements that you cannot control (seizure).  Your rash covers all or most of your body. The rash may or may not be painful.  You have blisters that: ? Are on top of the rash. ? Grow larger. ? Grow together. ? Are painful. ? Are inside your nose or mouth.  You have a rash that: ? Looks like purple pinprick-sized spots all over your body. ? Has a "bull's eye" or looks like a target. ? Is  red and painful, causes your skin to peel, and is not from being in the sun too long. Summary  A rash is a change in the color of your skin. A rash can also change the way your skin feels.  The goal of treatment is to stop the itching and keep the rash from spreading.  Take or apply over-the-counter and prescription medicines only as told by your doctor.  Contact a doctor if you have new symptoms or symptoms that get worse.  Keep all follow-up visits as told by your doctor. This is important. This information is not intended to replace advice given to you by your health care provider. Make sure you discuss any questions you have with your health care provider. Document Revised: 05/14/2018 Document Reviewed: 08/24/2017 Elsevier Patient Education  2020 [employer].

## 2020-01-03 NOTE — Progress Notes (Signed)
Patient ID: Amy Hunter, female    DOB: [birth year], 59 y.o.   MRN: [id number]   Chief Complaint  Patient presents with  . med check up   Subjective:  CC: medication check and rash on arm  Presents today for medication check for hypothyroidism, type 2 diabetes and hyperlipidemia.  Last lab work done on Jun 21, 2019, last A1c 6.9.  Reports medication compliance with all medications.  Patient she describes a rash on her right arm that is onset yesterday.  Reports that it is tingly and painful.  She reports she was under a lot of stress on Saturday and Sunday and feels it may be related.  She has several dermatological visits, sees dermatology for her psoriasis. med check up.   Pt is concerned about rash on right arm that came up this morning. States it is painful. Was seeing dermatologist for psorosis.    Medical History Mykaylah has a past medical history of Allergy, Anxiety, Arthritis, Complication of anesthesia, Depression, Diabetes mellitus, Family history of adverse reaction to anesthesia, Hypertension, Hypothyroidism, PONV (postoperative nausea and vomiting), and Sleep apnea.   Outpatient Encounter Medications as of 01/03/2020  Medication Sig  . allopurinol (ZYLOPRIM) 100 MG tablet TAKE 2 TABLETS BY MOUTH ONCE A DAY  . ALPRAZolam (XANAX) 1 MG tablet TAKE 1 TABLET BY MOUTH 2 TIMES PER DAY AS NEEDED FOR ANXIETY OR SLEEP  . colchicine 0.6 MG tablet TAKE 1 TABLET BY MOUTH DAILY.  Marland Kitchen Cyanocobalamin (VITAMIN B-12) 2000 MCG TBCR Take 2,000 mcg by mouth daily.   . Dulaglutide (TRULICITY) 3 MG/0.5ML SOPN INJECT 3 MG UNDER THE SKIN ONCE WEEKLY  . levothyroxine (SYNTHROID) 200 MCG tablet Take one tablet by mouth every day except on mondays take one and a half tablets.  Marland Kitchen lisinopril (ZESTRIL) 2.5 MG tablet Take 1 tablet (2.5 mg total) by mouth every morning.  . metFORMIN (GLUCOPHAGE) 500 MG tablet TAKE 1/2 (ONE-HALF) TABLET BY MOUTH TWICE DAILY WITH A MEAL  . metoprolol succinate (TOPROL-XL)  50 MG 24 hr tablet TAKE 1 TABLET (50 MG TOTAL) BY MOUTH DAILY. TAKE WITH OR IMMEDIATELY FOLLOWING A MEAL.  . naproxen (NAPROSYN) 500 MG tablet Take one tablet by mouth 2 times daily with a meal  . potassium chloride SA (KLOR-CON) 20 MEQ tablet TAKE 1 TABLET (20 MEQ TOTAL) BY MOUTH 2 TIMES DAILY.  . pravastatin (PRAVACHOL) 40 MG tablet Take 1 tablet (40 mg total) by mouth daily.  Marland Kitchen torsemide (DEMADEX) 20 MG tablet Take 3 tablets by mouth every morning and 3 tablets in the evening  . [DISCONTINUED] albuterol (VENTOLIN HFA) 108 (90 Base) MCG/ACT inhaler Inhale 2 puffs into the lungs every 4 (four) hours as needed for wheezing.  . [DISCONTINUED] allopurinol (ZYLOPRIM) 100 MG tablet TAKE 2 TABLETS BY MOUTH ONCE A DAY  . [DISCONTINUED] Dulaglutide (TRULICITY) 3 MG/0.5ML SOPN INJECT 3 MG UNDER THE SKIN ONCE WEEKLY  . [DISCONTINUED] halobetasol (ULTRAVATE) 0.05 % cream Apply topically 2 (two) times daily.  . [DISCONTINUED] hydrOXYzine (ATARAX/VISTARIL) 25 MG tablet One po Q 6 hours prn itch  . [DISCONTINUED] ketoconazole (NIZORAL) 2 % cream Apply 1 application topically 2 (two) times daily. To rash prn up to 2 weeks  . [DISCONTINUED] levothyroxine (SYNTHROID) 200 MCG tablet TAKE 1 TABLET BY MOUTH DAILY AND 1 & 1/2 TABLETS BY MOUTH EVERY MONDAY  . [DISCONTINUED] lisinopril (ZESTRIL) 2.5 MG tablet TAKE 1 TABLET (2.5 MG TOTAL) BY MOUTH EVERY MORNING.  . [DISCONTINUED] metFORMIN (GLUCOPHAGE) 500 MG tablet TAKE  1/2 (ONE-HALF) TABLET BY MOUTH TWICE DAILY WITH A MEAL  . [DISCONTINUED] metoprolol succinate (TOPROL-XL) 50 MG 24 hr tablet TAKE 1 TABLET (50 MG TOTAL) BY MOUTH DAILY. TAKE WITH OR IMMEDIATELY FOLLOWING A MEAL.  . [DISCONTINUED] mometasone (ELOCON) 0.1 % cream Apply 2 x per day PRN  . [DISCONTINUED] naproxen (NAPROSYN) 500 MG tablet TAKE 1 TABLET BY MOUTH 2 TIMES DAILY WITH A MEAL.  . [DISCONTINUED] ondansetron (ZOFRAN) 8 MG tablet Take 1 tablet (8 mg total) by mouth every 8 (eight) hours as needed for  nausea.  . [DISCONTINUED] potassium chloride SA (KLOR-CON) 20 MEQ tablet TAKE 1 TABLET (20 MEQ TOTAL) BY MOUTH 2 TIMES DAILY.  . [DISCONTINUED] pravastatin (PRAVACHOL) 20 MG tablet Take 1 tablet (20 mg total) by mouth every evening.  . [DISCONTINUED] pravastatin (PRAVACHOL) 40 MG tablet Take 1 tablet (40 mg total) by mouth daily.  . [DISCONTINUED] predniSONE (DELTASONE) 20 MG tablet 2 qd for 5 days  . [DISCONTINUED] torsemide (DEMADEX) 20 MG tablet TAKE 3 TABLETS BY MOUTH EVERY MORNING AND 3 TABLETS IN THE EVENING.  . [DISCONTINUED] triamcinolone cream (KENALOG) 0.1 % Use bid on rash for next 2 years  . predniSONE (DELTASONE) 20 MG tablet Take 1 tablet (20 mg total) by mouth daily with breakfast.   No facility-administered encounter medications on file as of 01/03/2020.     Review of Systems  Constitutional: Negative for chills and fever.  Respiratory: Negative for shortness of breath.   Cardiovascular: Negative for chest pain.  Gastrointestinal: Negative for abdominal pain.     Vitals BP 132/80   Pulse 93   Temp (!) 97.5 F (36.4 C)   Ht 5\' 6"  (1.676 m)   Wt (!) 312 lb (141.5 kg)   SpO2 96%   BMI 50.36 kg/m   Objective:   Physical Exam Vitals and nursing note reviewed.  Constitutional:      General: She is not in acute distress.    Appearance: Normal appearance. She is not ill-appearing.  Cardiovascular:     Rate and Rhythm: Normal rate and regular rhythm.     Heart sounds: Normal heart sounds.  Pulmonary:     Effort: Pulmonary effort is normal.     Breath sounds: Normal breath sounds.  Skin:    General: Skin is warm and dry.     Findings: Erythema and rash present. Rash is macular.     Comments: Erythematous macular rash noted on right upper arm right side of chest around to the right back area reports that this started on Monday is causing her some pain and tingling.  Neurological:     Mental Status: She is alert and oriented to person, place, and time.    Psychiatric:        Mood and Affect: Mood normal.        Behavior: Behavior normal.        Thought Content: Thought content normal.        Judgment: Judgment normal.      Assessment and Plan   1. Type 2 diabetes mellitus without complication, without long-term current use of insulin (HCC) - Hemoglobin A1c - metFORMIN (GLUCOPHAGE) 500 MG tablet; TAKE 1/2 (ONE-HALF) TABLET BY MOUTH TWICE DAILY WITH A MEAL  Dispense: 90 tablet; Refill: 1 - Dulaglutide (TRULICITY) 3 MG/0.5ML SOPN; INJECT 3 MG UNDER THE SKIN ONCE WEEKLY  Dispense: 2 mL; Refill: 5  2. Hypothyroidism, unspecified type - TSH - levothyroxine (SYNTHROID) 200 MCG tablet; Take one tablet by mouth every  day except on mondays take one and a half tablets.  Dispense: 96 tablet; Refill: 1  3. Other hyperlipidemia - Lipid panel - pravastatin (PRAVACHOL) 40 MG tablet; Take 1 tablet (40 mg total) by mouth daily.  Dispense: 90 tablet; Refill: 1  4. Atopic dermatitis, unspecified type - predniSONE (DELTASONE) 20 MG tablet; Take 1 tablet (20 mg total) by mouth daily with breakfast.  Dispense: 5 tablet; Refill: 0  5. High risk medication use - Comprehensive metabolic panel  6. Essential hypertension - torsemide (DEMADEX) 20 MG tablet; Take 3 tablets by mouth every morning and 3 tablets in the evening  Dispense: 540 tablet; Refill: 1 - potassium chloride SA (KLOR-CON) 20 MEQ tablet; TAKE 1 TABLET (20 MEQ TOTAL) BY MOUTH 2 TIMES DAILY.  Dispense: 180 tablet; Refill: 1 - metoprolol succinate (TOPROL-XL) 50 MG 24 hr tablet; TAKE 1 TABLET (50 MG TOTAL) BY MOUTH DAILY. TAKE WITH OR IMMEDIATELY FOLLOWING A MEAL.  Dispense: 90 tablet; Refill: 1 - lisinopril (ZESTRIL) 2.5 MG tablet; Take 1 tablet (2.5 mg total) by mouth every morning.  Dispense: 90 tablet; Refill: 1  7. Gout, unspecified cause, unspecified chronicity, unspecified site - naproxen (NAPROSYN) 500 MG tablet; Take one tablet by mouth 2 times daily with a meal  Dispense: 180 tablet;  Refill: 1 - allopurinol (ZYLOPRIM) 100 MG tablet; TAKE 2 TABLETS BY MOUTH ONCE A DAY  Dispense: 180 tablet; Refill: 1   Reports compliance with all medications as prescribed.  Will repeat labs later this week, when she can be fasting.  Will discuss results with her at that time.  She reports that she only uses her Xanax to 2 3 times per month, no refills given today.  Concerning the new erythematous macular rash on the right upper body, will treat with 5 days of low-dose prednisone, she will monitor her blood sugar during this time.  Warnings and information given concerning this.  Prednisone is an anti-inflammatory medication. It is best to only use it for short periods of time. It can cause  your blood pressure to increase and it can cause your blood sugar to increase. If you are a diabetic, please check your blood sugar twice per day while you are taking prednisone. If your sugars are > 200 or < 70 please seek medical attention right away.    Agrees with plan of care discussed today. Understands warning signs to seek further care: Chest pain, shortness of breath, any significant change in health status. Understands to follow-up if symptoms do not improve, or worsen.  Encouraged her to seek evaluation per her dermatologist for the new erythematous macular rash on the right side of her body.  She will monitor her blood sugar during her short course of prednisone, will let me know if this rash is not improved.  She will follow up in 6 months for routine medication check.  Dorena Bodo, FNP-C 01/03/2020

## 2020-01-17 ENCOUNTER — Other Ambulatory Visit: Payer: Self-pay | Admitting: Family Medicine

## 2020-01-18 ENCOUNTER — Other Ambulatory Visit: Payer: Self-pay | Admitting: Family Medicine

## 2020-01-18 MED FILL — COLCHICINE 0.6 MG TABS: 0.6 | 90 days supply | Qty: 90 | Fill #0

## 2020-02-06 MED FILL — METOPROLOL SUCCINATE ER 50: 50 | 90 days supply | Qty: 90 | Fill #0

## 2020-02-29 MED FILL — TRULICITY 3 MG/0.5ML SOPN: 3 | 28 days supply | Qty: 2 | Fill #1

## 2020-03-23 MED FILL — LEVOTHYROXINE SODIUM 200 MC: 200 | 90 days supply | Qty: 96 | Fill #0

## 2020-03-23 MED FILL — TORSEMIDE 20 MG TABLET: 20 | 30 days supply | Qty: 180 | Fill #0

## 2020-03-23 MED FILL — LISINOPRIL 2.5 MG TABLET: 2.5 | 90 days supply | Qty: 90 | Fill #0

## 2020-04-05 ENCOUNTER — Telehealth: Payer: Self-pay | Admitting: Family Medicine

## 2020-04-05 NOTE — Telephone Encounter (Signed)
Please remind the patient to do her lab work soon with her scheduled follow-up office visit later in May

## 2020-04-05 NOTE — Telephone Encounter (Signed)
Pt contacted and states she will go on her next day off

## 2020-04-07 MED FILL — METFORMIN HCL 500 MG TABS: 500 | 90 days supply | Qty: 90 | Fill #1

## 2020-04-23 MED FILL — COLCHICINE 0.6 MG TABS: 0.6 | 90 days supply | Qty: 90 | Fill #1

## 2020-04-23 MED FILL — NAPROXEN 500 MG TABLET: 500 | 90 days supply | Qty: 180 | Fill #1

## 2020-04-23 MED FILL — ALLOPURINOL 100 MG TABS: 100 | 90 days supply | Qty: 180 | Fill #1

## 2020-04-23 MED FILL — POTASSIUM CHLORIDE CRYS ER: 20 | 90 days supply | Qty: 180 | Fill #1

## 2020-04-27 MED FILL — TORSEMIDE 20 MG TABLET: 20 | 30 days supply | Qty: 180 | Fill #1

## 2020-04-27 MED FILL — PRAVASTATIN NA 40 MG TAB: 40 | 90 days supply | Qty: 90 | Fill #1

## 2020-05-05 ENCOUNTER — Other Ambulatory Visit (HOSPITAL_COMMUNITY): Payer: Self-pay

## 2020-05-08 DIAGNOSIS — Z79899 Other long term (current) drug therapy: Secondary | ICD-10-CM | POA: Diagnosis not present

## 2020-05-08 DIAGNOSIS — E119 Type 2 diabetes mellitus without complications: Secondary | ICD-10-CM | POA: Diagnosis not present

## 2020-05-08 DIAGNOSIS — E039 Hypothyroidism, unspecified: Secondary | ICD-10-CM | POA: Diagnosis not present

## 2020-05-08 DIAGNOSIS — E7849 Other hyperlipidemia: Secondary | ICD-10-CM | POA: Diagnosis not present

## 2020-05-09 ENCOUNTER — Other Ambulatory Visit (HOSPITAL_COMMUNITY): Payer: Self-pay

## 2020-05-09 LAB — COMPREHENSIVE METABOLIC PANEL
ALT: 71 IU/L — ABNORMAL HIGH (ref 0–32)
AST: 49 IU/L — ABNORMAL HIGH (ref 0–40)
Albumin/Globulin Ratio: 1.6 (ref 1.2–2.2)
Albumin: 4.1 g/dL (ref 3.8–4.9)
Alkaline Phosphatase: 147 IU/L — ABNORMAL HIGH (ref 44–121)
BUN/Creatinine Ratio: 27 (ref 12–28)
BUN: 18 mg/dL (ref 8–27)
Bilirubin Total: 0.4 mg/dL (ref 0.0–1.2)
CO2: 23 mmol/L (ref 20–29)
Calcium: 8.9 mg/dL (ref 8.7–10.3)
Chloride: 105 mmol/L (ref 96–106)
Creatinine, Ser: 0.66 mg/dL (ref 0.57–1.00)
Globulin, Total: 2.6 g/dL (ref 1.5–4.5)
Glucose: 107 mg/dL — ABNORMAL HIGH (ref 65–99)
Potassium: 4.2 mmol/L (ref 3.5–5.2)
Sodium: 143 mmol/L (ref 134–144)
Total Protein: 6.7 g/dL (ref 6.0–8.5)
eGFR: 100 mL/min/{1.73_m2} (ref >59)

## 2020-05-09 LAB — LIPID PANEL
Chol/HDL Ratio: 3.8 ratio (ref 0.0–4.4)
Cholesterol, Total: 144 mg/dL (ref 100–199)
HDL: 38 mg/dL — ABNORMAL LOW (ref >39)
LDL Chol Calc (NIH): 84 mg/dL (ref 0–99)
Triglycerides: 124 mg/dL (ref 0–149)
VLDL Cholesterol Cal: 22 mg/dL (ref 5–40)

## 2020-05-09 LAB — HEMOGLOBIN A1C
Est. average glucose Bld gHb Est-mCnc: 134 mg/dL
Hgb A1c MFr Bld: 6.3 % — ABNORMAL HIGH (ref 4.8–5.6)

## 2020-05-09 LAB — TSH: TSH: 0.769 u[IU]/mL (ref 0.450–4.500)

## 2020-05-09 MED FILL — Metoprolol Succinate Tab ER 24HR 50 MG (Tartrate Equiv): ORAL | 90 days supply | Qty: 90 | Fill #0 | Status: AC

## 2020-05-11 ENCOUNTER — Other Ambulatory Visit (HOSPITAL_COMMUNITY): Payer: Self-pay

## 2020-05-15 ENCOUNTER — Other Ambulatory Visit (HOSPITAL_COMMUNITY): Payer: Self-pay

## 2020-05-18 ENCOUNTER — Other Ambulatory Visit (HOSPITAL_COMMUNITY): Payer: Self-pay

## 2020-05-23 ENCOUNTER — Other Ambulatory Visit (HOSPITAL_COMMUNITY): Payer: Self-pay

## 2020-05-23 MED FILL — Dulaglutide Soln Auto-injector 3 MG/0.5ML: SUBCUTANEOUS | 84 days supply | Qty: 6 | Fill #0 | Status: AC

## 2020-05-24 ENCOUNTER — Other Ambulatory Visit (HOSPITAL_COMMUNITY): Payer: Self-pay

## 2020-05-25 ENCOUNTER — Other Ambulatory Visit (HOSPITAL_COMMUNITY): Payer: Self-pay

## 2020-05-28 MED FILL — Torsemide Tab 20 MG: ORAL | 90 days supply | Qty: 540 | Fill #0 | Status: AC

## 2020-05-29 ENCOUNTER — Other Ambulatory Visit (HOSPITAL_COMMUNITY): Payer: Self-pay

## 2020-05-30 ENCOUNTER — Other Ambulatory Visit (HOSPITAL_COMMUNITY): Payer: Self-pay

## 2020-07-01 ENCOUNTER — Other Ambulatory Visit: Payer: Self-pay | Admitting: Family Medicine

## 2020-07-01 DIAGNOSIS — E119 Type 2 diabetes mellitus without complications: Secondary | ICD-10-CM

## 2020-07-01 MED FILL — Lisinopril Tab 2.5 MG: ORAL | 90 days supply | Qty: 90 | Fill #0 | Status: CN

## 2020-07-01 MED FILL — Levothyroxine Sodium Tab 200 MCG: ORAL | 90 days supply | Qty: 96 | Fill #0 | Status: CN

## 2020-07-03 ENCOUNTER — Other Ambulatory Visit: Payer: Self-pay

## 2020-07-03 ENCOUNTER — Ambulatory Visit: Payer: 59 | Admitting: Family Medicine

## 2020-07-03 ENCOUNTER — Encounter: Payer: Self-pay | Admitting: Family Medicine

## 2020-07-03 ENCOUNTER — Other Ambulatory Visit (HOSPITAL_COMMUNITY): Payer: Self-pay

## 2020-07-03 VITALS — BP 138/86 | HR 87 | Temp 97.2°F | Ht 66.0 in | Wt 311.0 lb

## 2020-07-03 DIAGNOSIS — R21 Rash and other nonspecific skin eruption: Secondary | ICD-10-CM | POA: Diagnosis not present

## 2020-07-03 DIAGNOSIS — E785 Hyperlipidemia, unspecified: Secondary | ICD-10-CM | POA: Diagnosis not present

## 2020-07-03 DIAGNOSIS — Z1211 Encounter for screening for malignant neoplasm of colon: Secondary | ICD-10-CM

## 2020-07-03 DIAGNOSIS — I10 Essential (primary) hypertension: Secondary | ICD-10-CM | POA: Diagnosis not present

## 2020-07-03 DIAGNOSIS — E119 Type 2 diabetes mellitus without complications: Secondary | ICD-10-CM | POA: Diagnosis not present

## 2020-07-03 DIAGNOSIS — E039 Hypothyroidism, unspecified: Secondary | ICD-10-CM

## 2020-07-03 DIAGNOSIS — E7849 Other hyperlipidemia: Secondary | ICD-10-CM

## 2020-07-03 DIAGNOSIS — E1169 Type 2 diabetes mellitus with other specified complication: Secondary | ICD-10-CM

## 2020-07-03 DIAGNOSIS — M109 Gout, unspecified: Secondary | ICD-10-CM | POA: Diagnosis not present

## 2020-07-03 MED ORDER — METFORMIN HCL 500 MG PO TABS
250.0000 mg | ORAL_TABLET | Freq: Two times a day (BID) | ORAL | 1 refills | Status: DC
  Filled 2020-07-03: qty 90, 90d supply, fill #0
  Filled 2020-09-26: qty 90, 90d supply, fill #1

## 2020-07-03 MED ORDER — LEVOTHYROXINE SODIUM 200 MCG PO TABS
ORAL_TABLET | ORAL | 1 refills | Status: DC
  Filled 2020-07-03: qty 96, 90d supply, fill #0
  Filled 2020-10-03: qty 96, 90d supply, fill #1

## 2020-07-03 MED ORDER — NAPROXEN 500 MG PO TABS
500.0000 mg | ORAL_TABLET | Freq: Two times a day (BID) | ORAL | 1 refills | Status: DC
  Filled 2020-07-03: qty 180, 90d supply, fill #0
  Filled 2020-10-03: qty 180, 90d supply, fill #1

## 2020-07-03 MED ORDER — TORSEMIDE 20 MG PO TABS
ORAL_TABLET | ORAL | 1 refills | Status: DC
  Filled 2020-07-03: qty 540, fill #0
  Filled 2020-09-04: qty 540, 90d supply, fill #0

## 2020-07-03 MED ORDER — ALLOPURINOL 100 MG PO TABS
200.0000 mg | ORAL_TABLET | Freq: Every day | ORAL | 1 refills | Status: DC
  Filled 2020-07-03: qty 180, 90d supply, fill #0
  Filled 2020-10-03: qty 180, 90d supply, fill #1

## 2020-07-03 MED ORDER — LISINOPRIL 2.5 MG PO TABS
ORAL_TABLET | ORAL | 1 refills | Status: DC
  Filled 2020-07-03: qty 90, 90d supply, fill #0
  Filled 2020-10-03: qty 90, 90d supply, fill #1

## 2020-07-03 MED ORDER — METOPROLOL SUCCINATE ER 50 MG PO TB24
ORAL_TABLET | Freq: Every day | ORAL | 1 refills | Status: DC
  Filled 2020-07-03: qty 90, fill #0
  Filled 2020-07-25: qty 90, 90d supply, fill #0
  Filled 2020-10-25: qty 90, 90d supply, fill #1

## 2020-07-03 MED ORDER — PRAVASTATIN SODIUM 40 MG PO TABS
40.0000 mg | ORAL_TABLET | Freq: Every day | ORAL | 1 refills | Status: DC
  Filled 2020-07-03: qty 90, 90d supply, fill #0
  Filled 2020-10-03: qty 90, 90d supply, fill #1

## 2020-07-03 MED ORDER — DULAGLUTIDE 3 MG/0.5ML ~~LOC~~ SOAJ
SUBCUTANEOUS | 5 refills | Status: AC
  Filled 2020-07-03: qty 2, fill #0
  Filled 2020-10-25: qty 2, 28d supply, fill #0
  Filled 2020-12-20: qty 2, 28d supply, fill #1
  Filled 2021-04-29: qty 2, 28d supply, fill #2
  Filled 2021-06-13: qty 2, 28d supply, fill #3

## 2020-07-03 MED ORDER — POTASSIUM CHLORIDE CRYS ER 20 MEQ PO TBCR
20.0000 meq | EXTENDED_RELEASE_TABLET | Freq: Two times a day (BID) | ORAL | 1 refills | Status: DC
  Filled 2020-07-03: qty 180, 90d supply, fill #0
  Filled 2020-10-03: qty 180, 90d supply, fill #1

## 2020-07-03 NOTE — Progress Notes (Signed)
   Subjective:    Patient ID: Amy Hunter, female    DOB: 10-Jun-1960, 60 y.o.   MRN: [id number]  Diabetes She presents for her follow-up diabetic visit. She has type 2 diabetes mellitus. She is compliant with treatment all of the time. Home blood sugar record trend: pt states sugar numbers are ok  She does not see a podiatrist.Eye exam is current.   Rash on abdomen and legs. Came up months ago. Does not itch or bother her. Went to dermatology - Dr. Margo Aye and prescribed a cream that has not helped.    Review of Systems     Objective:   Physical Exam Lungs are clear heart is regular there is some pedal edema.  There is a lacy red rash on the abdomen possibly consistent with a psoriasis like rash   Diabetic foot exam completed    Assessment & Plan:  1. Screening for malignant neoplasm of colon Screening for colon cancer.  Patient prefers this modality - Cologuard  2. Rash Nondistended rash recommend that she go back to see dermatology.  More than likely related to her psoriasis hard to tell  3. Morbid obesity (HCC) Portion control regular physical activity recommended  4. Type 2 diabetes mellitus without complication, without long-term current use of insulin (HCC) Continue current measures watch diet closely watch portions  5. Hypothyroidism, unspecified type Thyroid exam good lab looks good continue current measures  6. Hyperlipidemia associated with type 2 diabetes mellitus (HCC) Continue cholesterol medicine labs reviewed  Follow-up by 6 months

## 2020-07-09 ENCOUNTER — Other Ambulatory Visit (HOSPITAL_COMMUNITY): Payer: Self-pay

## 2020-07-24 ENCOUNTER — Other Ambulatory Visit (HOSPITAL_COMMUNITY): Payer: Self-pay

## 2020-07-25 ENCOUNTER — Other Ambulatory Visit: Payer: Self-pay | Admitting: Family Medicine

## 2020-07-26 ENCOUNTER — Other Ambulatory Visit (HOSPITAL_COMMUNITY): Payer: Self-pay

## 2020-07-27 ENCOUNTER — Encounter: Payer: Self-pay | Admitting: Family Medicine

## 2020-07-31 ENCOUNTER — Other Ambulatory Visit (HOSPITAL_COMMUNITY): Payer: Self-pay

## 2020-07-31 ENCOUNTER — Other Ambulatory Visit: Payer: Self-pay | Admitting: Family Medicine

## 2020-08-01 ENCOUNTER — Telehealth: Payer: Self-pay | Admitting: Family Medicine

## 2020-08-01 LAB — COLOGUARD

## 2020-08-01 MED ORDER — COLCHICINE 0.6 MG PO TABS
0.6000 mg | ORAL_TABLET | Freq: Every day | ORAL | 1 refills | Status: DC
  Filled 2020-08-01: qty 90, 90d supply, fill #0
  Filled 2020-11-19: qty 90, 90d supply, fill #1

## 2020-08-01 NOTE — Telephone Encounter (Signed)
Pt is requesting a medication refill for    colchicine 0.6 MG tablet   [facility]  Pt has also voiced that she  sent back the colon test

## 2020-08-01 NOTE — Telephone Encounter (Signed)
FYI refill given

## 2020-08-02 ENCOUNTER — Other Ambulatory Visit (HOSPITAL_COMMUNITY): Payer: Self-pay

## 2020-08-14 ENCOUNTER — Ambulatory Visit: Payer: 59 | Admitting: Family Medicine

## 2020-08-14 ENCOUNTER — Encounter: Payer: Self-pay | Admitting: Family Medicine

## 2020-08-14 ENCOUNTER — Other Ambulatory Visit: Payer: Self-pay

## 2020-08-14 VITALS — BP 128/86 | Temp 97.9°F | Ht 66.0 in | Wt 311.0 lb

## 2020-08-14 DIAGNOSIS — M25552 Pain in left hip: Secondary | ICD-10-CM | POA: Diagnosis not present

## 2020-08-14 DIAGNOSIS — M5432 Sciatica, left side: Secondary | ICD-10-CM | POA: Diagnosis not present

## 2020-08-14 DIAGNOSIS — M545 Low back pain, unspecified: Secondary | ICD-10-CM | POA: Diagnosis not present

## 2020-08-14 MED ORDER — PREDNISONE 20 MG PO TABS: ORAL_TABLET | ORAL | 0 refills | Status: DC

## 2020-08-14 NOTE — Progress Notes (Signed)
   Subjective:    Patient ID: Amy Hunter, female    DOB: 25-Nov-1960, 60 y.o.   MRN: [id number]  HPI  Patient arrives with left leg pain since Friday afternoon. The pain is worse with walking and is up high and going down her leg. This is been going on for several days worse over the past couple days very difficult for patient to walk does not know of any injury Review of Systems     Objective:   Physical Exam Positive straight leg raise subjective discomfort in the left hip and lower back increased pain with straight leg patient walks with a severe limp       Assessment & Plan:  Concerning for left hip pain Also low back pain Probable sciatica Prednisone taper Work excuse for this week and next week Follow-up in 4 weeks X-rays ordered to make sure does not have an underlying fracture given her age and significant obesity

## 2020-08-14 NOTE — Patient Instructions (Signed)
Take your prednisone over the next 8 days Please follow-up in approximately 3 to 4 weeks Work excuse for the next 2 weeks please send [place] your FMLA Please do your x-rays either later today or tomorrow we will let you know the results

## 2020-08-15 ENCOUNTER — Ambulatory Visit (HOSPITAL_COMMUNITY)
Admission: RE | Admit: 2020-08-15 | Discharge: 2020-08-15 | Disposition: A | Discharge status: Home / Self Care | Payer: 59 | Source: Ambulatory Visit | Attending: Family Medicine | Admitting: Family Medicine

## 2020-08-15 DIAGNOSIS — M25552 Pain in left hip: Secondary | ICD-10-CM | POA: Diagnosis not present

## 2020-08-15 DIAGNOSIS — M545 Low back pain, unspecified: Secondary | ICD-10-CM | POA: Diagnosis not present

## 2020-08-15 DIAGNOSIS — I878 Other specified disorders of veins: Secondary | ICD-10-CM | POA: Diagnosis not present

## 2020-08-15 DIAGNOSIS — M79605 Pain in left leg: Secondary | ICD-10-CM | POA: Diagnosis not present

## 2020-08-15 DIAGNOSIS — M1612 Unilateral primary osteoarthritis, left hip: Secondary | ICD-10-CM | POA: Diagnosis not present

## 2020-08-15 DIAGNOSIS — M5136 Other intervertebral disc degeneration, lumbar region: Secondary | ICD-10-CM | POA: Diagnosis not present

## 2020-08-15 DIAGNOSIS — M5432 Sciatica, left side: Secondary | ICD-10-CM | POA: Insufficient documentation

## 2020-08-17 ENCOUNTER — Other Ambulatory Visit: Payer: Self-pay | Admitting: Family Medicine

## 2020-08-17 ENCOUNTER — Telehealth: Payer: Self-pay | Admitting: Family Medicine

## 2020-08-17 MED ORDER — HYDROCODONE-ACETAMINOPHEN 5-325 MG PO TABS: 1.0000 | ORAL_TABLET | Freq: Four times a day (QID) | ORAL | 0 refills | Status: AC | PRN

## 2020-08-17 NOTE — Telephone Encounter (Signed)
Patient is requesting something for pain. She states pain is moving from on leg to the other is that normal.[facility]

## 2020-08-17 NOTE — Telephone Encounter (Signed)
Pt.notified

## 2020-08-17 NOTE — Telephone Encounter (Signed)
Patient had FMLA faxed over to be completed in your box. ?

## 2020-08-17 NOTE — Telephone Encounter (Signed)
Hydrocodone 1 every 6 hours as needed pain caution drowsiness not for frequent use  Patient has follow-up in August if she progresses to get worse may need to do follow-up sooner

## 2020-08-18 NOTE — Telephone Encounter (Signed)
Was completed as best I could

## 2020-09-05 ENCOUNTER — Other Ambulatory Visit (HOSPITAL_COMMUNITY): Payer: Self-pay

## 2020-09-13 ENCOUNTER — Ambulatory Visit: Payer: 59 | Admitting: Family Medicine

## 2020-09-14 ENCOUNTER — Other Ambulatory Visit: Payer: Self-pay

## 2020-09-14 ENCOUNTER — Ambulatory Visit
Admission: EM | Admit: 2020-09-14 | Discharge: 2020-09-14 | Disposition: A | Discharge status: Home / Self Care | Payer: 59 | Attending: Emergency Medicine | Admitting: Emergency Medicine

## 2020-09-14 ENCOUNTER — Encounter: Payer: Self-pay | Admitting: Emergency Medicine

## 2020-09-14 DIAGNOSIS — H66001 Acute suppurative otitis media without spontaneous rupture of ear drum, right ear: Secondary | ICD-10-CM

## 2020-09-14 DIAGNOSIS — J029 Acute pharyngitis, unspecified: Secondary | ICD-10-CM

## 2020-09-14 MED ORDER — AZITHROMYCIN 250 MG PO TABS: 250.0000 mg | ORAL_TABLET | Freq: Every day | ORAL | 0 refills | Status: DC

## 2020-09-14 NOTE — ED Provider Notes (Signed)
[facility]   [id number] 09/14/20 Arrival Time: 1835   CC: COVID symptoms  SUBJECTIVE: History from: patient.  Amy Hunter is a 60 y.o. female who presents with RT ear pain and sore throat x 2 days.  Denies sick exposure to COVID, flu or strep.  Had negative covid test today.  Has tried OTC medications without relief.  Symptoms are made worse with swallowing.  Reports previous symptoms in the past.   Denies fever, chills, SOB, wheezing, chest pain, nausea, changes in bowel or bladder habits.     ROS: As per HPI.  All other pertinent ROS negative.     Past Medical History:  Diagnosis Date   Allergy    Anxiety    Arthritis    Complication of anesthesia    Depression    Diabetes mellitus    type 2   Family history of adverse reaction to anesthesia    mother got n/v   Hypertension    Hypothyroidism    PONV (postoperative nausea and vomiting)    Sleep apnea    no cpap does not tolerate    Past Surgical History:  Procedure Laterality Date   CHOLECYSTECTOMY     HERNIA REPAIR     umbilical  Dr. Derrell Lolling 07-07-17   UMBILICAL HERNIA REPAIR N/A 07/07/2017   Procedure: OPEN UMBILICAL HERNIA REPAIR WITH MESH;  Surgeon: Axel Filler, MD;  Location: [facility];  Service: General;  Laterality: N/A;   uterine ablation     VARICOSE VEIN SURGERY Bilateral    laser treatment--ligation   Allergies  Allergen Reactions   Benadryl [Diphenhydramine Hcl] Other (See Comments)    Increase heart rate   Indomethacin Other (See Comments)    Dizziness and nausea, tolerates Naproxen   Keflex [Cephalexin]     Possible delayed rash   No current facility-administered medications on file prior to encounter.   Current Outpatient Medications on File Prior to Encounter  Medication Sig Dispense Refill   allopurinol (ZYLOPRIM) 100 MG tablet Take 2 tablets (200 mg total) by mouth daily. 180 tablet 1   ALPRAZolam (XANAX) 1 MG tablet TAKE 1 TABLET BY MOUTH 2 TIMES PER DAY AS NEEDED FOR  ANXIETY OR SLEEP 90 tablet 3   colchicine 0.6 MG tablet Take 1 tablet (0.6 mg total) by mouth daily. 90 tablet 1   Cyanocobalamin (VITAMIN B-12) 2000 MCG TBCR Take 2,000 mcg by mouth daily.      Dulaglutide 3 MG/0.5ML SOPN INJECT 3 MG UNDER THE SKIN ONCE WEEKLY 2 mL 5   levothyroxine (SYNTHROID) 200 MCG tablet TAKE 1 TABLET BY MOUTH EVERY DAY EXCEPT ON MONDAYS TAKE 1 & 1/2 TABLETS. 96 tablet 1   lisinopril (ZESTRIL) 2.5 MG tablet TAKE 1 TABLET (2.5 MG TOTAL) BY MOUTH EVERY MORNING. 90 tablet 1   metFORMIN (GLUCOPHAGE) 500 MG tablet Take 0.5 tablets (250 mg total) by mouth 2 (two) times daily. 90 tablet 1   metoprolol succinate (TOPROL-XL) 50 MG 24 hr tablet TAKE 1 TABLET (50 MG TOTAL) BY MOUTH DAILY. TAKE WITH OR IMMEDIATELY FOLLOWING A MEAL. 90 tablet 1   naproxen (NAPROSYN) 500 MG tablet Take 1 tablet (500 mg total) by mouth 2 (two) times daily with a meal. 180 tablet 1   potassium chloride SA (KLOR-CON) 20 MEQ tablet Take 1 tablet (20 mEq total) by mouth 2 (two) times daily. 180 tablet 1   pravastatin (PRAVACHOL) 40 MG tablet Take 1 tablet (40 mg total) by mouth daily. 90 tablet 1  predniSONE (DELTASONE) 20 MG tablet 2 qd for 4d then 1qd for 4d 18 tablet 0   torsemide (DEMADEX) 20 MG tablet TAKE 3 TABLETS BY MOUTH EVERY MORNING AND 3 TABLETS IN THE EVENING 540 tablet 1   Social History   Socioeconomic History   Marital status: Married    Spouse name: Not on file   Number of children: Not on file   Years of education: Not on file   Highest education level: Not on file  Occupational History   Occupation: Multimedia programmer: [employer]  Tobacco Use   Smoking status: Former    Packs/day: 0.50    Years: 15.00    Pack years: 7.50    Types: Cigarettes    Quit date: 02/03/2010    Years since quitting: 10.6   Smokeless tobacco: Never   Tobacco comments:    pt reports she smoked "off and on" for the entire smoking years.   Vaping Use   Vaping Use: Never used  Substance and Sexual  Activity   Alcohol use: No   Drug use: No   Sexual activity: Not Currently  Other Topics Concern   Not on file  Social History Narrative   Not on file   Social Determinants of Health   Financial Resource Strain: Not on file  Food Insecurity: Not on file  Transportation Needs: Not on file  Physical Activity: Not on file  Stress: Not on file  Social Connections: Not on file  Intimate Partner Violence: Not on file   Family History  Problem Relation Age of Onset   Diabetes Mother    Hypertension Mother    Heart disease Mother    Breast cancer Mother    Lung cancer Mother    Heart disease Brother    Heart disease Father    Lung cancer Father    Brain cancer Father    Asthma Father    Lung cancer Maternal Grandmother    Colon cancer Paternal Grandmother     OBJECTIVE:  Vitals:   09/14/20 1901  BP: (!) 141/80  Pulse: 96  Resp: 18  Temp: 97.8 F (36.6 C)  TempSrc: Oral  SpO2: 94%    General appearance: alert; mildly fatigued appearing, nontoxic; speaking in full sentences and tolerating own secretions HEENT: NCAT; Ears: EACs clear, LT TM pearly gray, RT TM erythematous; Eyes: PERRL.  EOM grossly intact. Nose: nares patent without rhinorrhea, Throat: oropharynx clear, tonsils erythematous and  enlarged, uvula midline  Neck: supple without LAD Lungs: unlabored respirations, symmetrical air entry; cough: absent; no respiratory distress; CTAB Heart: regular rate and rhythm.  Skin: warm and dry Psychological: alert and cooperative; normal mood and affect   ASSESSMENT & PLAN:  1. Non-recurrent acute suppurative otitis media of right ear without spontaneous rupture of tympanic membrane   2. Sore throat     Meds ordered this encounter  Medications   azithromycin (ZITHROMAX) 250 MG tablet    Sig: Take 1 tablet (250 mg total) by mouth daily. Take first 2 tablets together, then 1 every day until finished.    Dispense:  6 tablet    Refill:  0    Order Specific Question:    Supervising Provider    Answer:   Eustace Moore [[id number]]    Get plenty of rest and push fluids Prescribed azithromycin Use OTC zyrtec for nasal congestion, runny nose, and/or sore throat Use OTC flonase for nasal congestion and runny nose Use medications daily for symptom  relief Use OTC medications like ibuprofen or tylenol as needed fever or pain Call or go to the ED if you have any new or worsening symptoms such as fever, worsening cough, shortness of breath, chest tightness, chest pain, turning blue, changes in mental status, etc...   Reviewed expectations re: course of current medical issues. Questions answered. Outlined signs and symptoms indicating need for more acute intervention. Patient verbalized understanding. After Visit Summary given.          Rennis Harding, PA-C 09/14/20 1916

## 2020-09-14 NOTE — ED Triage Notes (Signed)
Sore throat x 2 days.  Sinus pressure under right eye and right ear pain  and runny nose.  Covid Test from [facility] came back negative today

## 2020-09-14 NOTE — Discharge Instructions (Addendum)
Get plenty of rest and push fluids Prescribed azithromycin Use OTC zyrtec for nasal congestion, runny nose, and/or sore throat Use OTC flonase for nasal congestion and runny nose Use medications daily for symptom relief Use OTC medications like ibuprofen or tylenol as needed fever or pain Call or go to the ED if you have any new or worsening symptoms such as fever, worsening cough, shortness of breath, chest tightness, chest pain, turning blue, changes in mental status, etc..Marland Kitchen

## 2020-09-17 ENCOUNTER — Ambulatory Visit: Payer: 59 | Admitting: Family Medicine

## 2020-09-17 ENCOUNTER — Encounter: Payer: Self-pay | Admitting: Family Medicine

## 2020-09-27 ENCOUNTER — Other Ambulatory Visit (HOSPITAL_COMMUNITY): Payer: Self-pay

## 2020-10-03 ENCOUNTER — Other Ambulatory Visit (HOSPITAL_COMMUNITY): Payer: Self-pay

## 2020-10-25 ENCOUNTER — Other Ambulatory Visit (HOSPITAL_COMMUNITY): Payer: Self-pay

## 2020-11-19 ENCOUNTER — Other Ambulatory Visit (HOSPITAL_COMMUNITY): Payer: Self-pay

## 2020-11-20 ENCOUNTER — Other Ambulatory Visit (HOSPITAL_COMMUNITY): Payer: Self-pay

## 2020-11-28 ENCOUNTER — Telehealth: Payer: Self-pay | Admitting: Family Medicine

## 2020-11-28 MED ORDER — AMLODIPINE BESYLATE 5 MG PO TABS: 5.0000 mg | ORAL_TABLET | Freq: Every day | ORAL | 3 refills | Status: DC

## 2020-11-28 NOTE — Telephone Encounter (Signed)
Patient has appointment 10/27 at 8:20

## 2020-11-28 NOTE — Telephone Encounter (Signed)
Add amlodipine 5 mg, #30, 1 daily, 3 refills Keep appointment Bring all pill bottles with her (May need to consider stopping Naprosyn given elevated blood pressure)

## 2020-11-28 NOTE — Telephone Encounter (Signed)
Patient informed per drs notes regarding new BP med recommendations.

## 2020-11-28 NOTE — Telephone Encounter (Signed)
Patient is having blood pressure issues ,[facility] night to check Blood pressure and it was 189/82 She schedule appointment with Amy Hunter on 10/28 at 2 pm but wanting to know what to do to get it down until she is seen

## 2020-11-29 ENCOUNTER — Other Ambulatory Visit: Payer: Self-pay

## 2020-11-29 ENCOUNTER — Ambulatory Visit: Payer: 59 | Admitting: Family Medicine

## 2020-11-29 ENCOUNTER — Other Ambulatory Visit (HOSPITAL_COMMUNITY): Payer: Self-pay

## 2020-11-29 VITALS — BP 129/76 | HR 84 | Temp 97.3°F | Ht 66.0 in | Wt 296.0 lb

## 2020-11-29 DIAGNOSIS — E1169 Type 2 diabetes mellitus with other specified complication: Secondary | ICD-10-CM | POA: Diagnosis not present

## 2020-11-29 DIAGNOSIS — I10 Essential (primary) hypertension: Secondary | ICD-10-CM | POA: Diagnosis not present

## 2020-11-29 DIAGNOSIS — E039 Hypothyroidism, unspecified: Secondary | ICD-10-CM | POA: Diagnosis not present

## 2020-11-29 DIAGNOSIS — E7849 Other hyperlipidemia: Secondary | ICD-10-CM | POA: Diagnosis not present

## 2020-11-29 DIAGNOSIS — Z23 Encounter for immunization: Secondary | ICD-10-CM | POA: Diagnosis not present

## 2020-11-29 DIAGNOSIS — M109 Gout, unspecified: Secondary | ICD-10-CM | POA: Diagnosis not present

## 2020-11-29 DIAGNOSIS — K760 Fatty (change of) liver, not elsewhere classified: Secondary | ICD-10-CM

## 2020-11-29 DIAGNOSIS — E119 Type 2 diabetes mellitus without complications: Secondary | ICD-10-CM | POA: Diagnosis not present

## 2020-11-29 DIAGNOSIS — E785 Hyperlipidemia, unspecified: Secondary | ICD-10-CM

## 2020-11-29 DIAGNOSIS — M1A0790 Idiopathic chronic gout, unspecified ankle and foot, without tophus (tophi): Secondary | ICD-10-CM

## 2020-11-29 MED ORDER — LEVOTHYROXINE SODIUM 200 MCG PO TABS
ORAL_TABLET | ORAL | 1 refills | Status: DC
  Filled 2020-11-29: qty 96, fill #0
  Filled 2021-01-23: qty 96, 90d supply, fill #0
  Filled 2021-05-10: qty 96, 90d supply, fill #1

## 2020-11-29 MED ORDER — AMLODIPINE BESYLATE 5 MG PO TABS
5.0000 mg | ORAL_TABLET | Freq: Every day | ORAL | 1 refills | Status: DC
  Filled 2020-11-29: qty 90, 90d supply, fill #0

## 2020-11-29 MED ORDER — PRAVASTATIN SODIUM 40 MG PO TABS
40.0000 mg | ORAL_TABLET | Freq: Every day | ORAL | 1 refills | Status: DC
  Filled 2020-11-29: qty 90, 90d supply, fill #0

## 2020-11-29 MED ORDER — ALLOPURINOL 100 MG PO TABS
200.0000 mg | ORAL_TABLET | Freq: Every day | ORAL | 1 refills | Status: DC
  Filled 2020-11-29 – 2021-03-13 (×2): qty 180, 90d supply, fill #0
  Filled 2021-07-08: qty 180, 90d supply, fill #1

## 2020-11-29 MED ORDER — METOPROLOL SUCCINATE ER 50 MG PO TB24
50.0000 mg | ORAL_TABLET | Freq: Every day | ORAL | 1 refills | Status: DC
  Filled 2020-11-29: qty 90, fill #0
  Filled 2021-02-08: qty 90, 90d supply, fill #0
  Filled 2021-05-10 (×2): qty 90, 90d supply, fill #1

## 2020-11-29 MED ORDER — LISINOPRIL 2.5 MG PO TABS
2.5000 mg | ORAL_TABLET | Freq: Every morning | ORAL | 1 refills | Status: DC
  Filled 2020-11-29: qty 90, fill #0
  Filled 2021-01-10: qty 90, 90d supply, fill #0
  Filled 2021-05-01: qty 90, 90d supply, fill #1

## 2020-11-29 MED ORDER — TORSEMIDE 20 MG PO TABS
ORAL_TABLET | ORAL | 1 refills | Status: DC
  Filled 2020-11-29: qty 540, 90d supply, fill #0
  Filled 2021-04-04: qty 180, 30d supply, fill #1
  Filled 2021-05-10: qty 180, 30d supply, fill #2
  Filled 2021-06-13: qty 180, 30d supply, fill #3

## 2020-11-29 MED ORDER — POTASSIUM CHLORIDE CRYS ER 20 MEQ PO TBCR
20.0000 meq | EXTENDED_RELEASE_TABLET | Freq: Two times a day (BID) | ORAL | 1 refills | Status: DC
  Filled 2020-11-29 – 2021-02-27 (×2): qty 180, 90d supply, fill #0
  Filled 2021-06-13: qty 180, 90d supply, fill #1

## 2020-11-29 MED ORDER — NAPROXEN 500 MG PO TABS
500.0000 mg | ORAL_TABLET | Freq: Two times a day (BID) | ORAL | 1 refills | Status: DC
  Filled 2020-11-29 – 2021-03-13 (×2): qty 180, 90d supply, fill #0
  Filled 2021-07-08: qty 180, 90d supply, fill #1

## 2020-11-29 MED ORDER — METFORMIN HCL 500 MG PO TABS
250.0000 mg | ORAL_TABLET | Freq: Two times a day (BID) | ORAL | 1 refills | Status: DC
  Filled 2020-11-29 – 2021-01-10 (×2): qty 90, 90d supply, fill #0
  Filled 2021-04-29: qty 90, 90d supply, fill #1

## 2020-11-29 NOTE — Progress Notes (Signed)
   Subjective:    Patient ID: Amy Hunter, female    DOB: 06-20-1960, 60 y.o.   MRN: [id number]  HPI Elevated BP readings  She relates she is having headaches at work finds her self feeling somewhat lightheaded feels pressure in her head no nausea or vomiting with it she states workplace is very stressful.  She is having to work a lot of hours sometimes 16 strain in addition to this it is undermanned  She also has diabetes she believes that is under good control she is trying to watch her diet she is trying to lose weight has morbid obesity she has lost some weight over the past 6 months  She also has blood pressure issues she is takes her medicine regular basis and has chronic swelling of the lower legs but its not been as bad since she has been trying to eat healthier and losing weight  She does take her thyroid medicine regular basis denies any problems with  Review of Systems     Objective:   Physical Exam General-in no acute distress Eyes-no discharge Lungs-respiratory rate normal, CTA CV-no murmurs,RRR Extremities skin warm dry no edema Neuro grossly normal Behavior normal, alert        Assessment & Plan:   Blood pressure rechecked several times was normal  I believe she is suffering from excessive stress at work I think it is very important for her to do the best she can and trying to be healthy with her eating regular physical activity continue her medications.  I also believe it is wise for her to limit work to no more than 8 to 10 hours on a shift.  Diabetes hopefully under decent control labs ordered await the results of reading renewals of medicines given  Blood pressure good control continue current measures  Gout no flareups recently refills given only uses colchicine on a as needed basis  Morbid obesity she is trying to lose weight she is taking her medicines

## 2020-11-30 ENCOUNTER — Other Ambulatory Visit (HOSPITAL_COMMUNITY): Payer: Self-pay

## 2020-11-30 ENCOUNTER — Encounter: Payer: Self-pay | Admitting: Family Medicine

## 2020-11-30 ENCOUNTER — Ambulatory Visit: Payer: 59 | Admitting: Nurse Practitioner

## 2020-11-30 LAB — MICROALBUMIN / CREATININE URINE RATIO
Creatinine, Urine: 14.1 mg/dL
Microalb/Creat Ratio: <21 mg/g creat (ref 0–29)
Microalbumin, Urine: <3 ug/mL

## 2020-11-30 LAB — HEPATIC FUNCTION PANEL
ALT: 39 IU/L — ABNORMAL HIGH (ref 0–32)
AST: 37 IU/L (ref 0–40)
Albumin: 4.5 g/dL (ref 3.8–4.8)
Alkaline Phosphatase: 169 IU/L — ABNORMAL HIGH (ref 44–121)
Bilirubin Total: 0.3 mg/dL (ref 0.0–1.2)
Bilirubin, Direct: 0.15 mg/dL (ref 0.00–0.40)
Total Protein: 7 g/dL (ref 6.0–8.5)

## 2020-11-30 LAB — BASIC METABOLIC PANEL
BUN/Creatinine Ratio: 22 (ref 12–28)
BUN: 15 mg/dL (ref 8–27)
CO2: 27 mmol/L (ref 20–29)
Calcium: 9.2 mg/dL (ref 8.7–10.3)
Chloride: 102 mmol/L (ref 96–106)
Creatinine, Ser: 0.67 mg/dL (ref 0.57–1.00)
Glucose: 100 mg/dL — ABNORMAL HIGH (ref 70–99)
Potassium: 4.4 mmol/L (ref 3.5–5.2)
Sodium: 143 mmol/L (ref 134–144)
eGFR: 99 mL/min/{1.73_m2} (ref >59)

## 2020-11-30 LAB — LIPID PANEL
Chol/HDL Ratio: 3.2 ratio (ref 0.0–4.4)
Cholesterol, Total: 130 mg/dL (ref 100–199)
HDL: 41 mg/dL (ref >39)
LDL Chol Calc (NIH): 69 mg/dL (ref 0–99)
Triglycerides: 107 mg/dL (ref 0–149)
VLDL Cholesterol Cal: 20 mg/dL (ref 5–40)

## 2020-11-30 LAB — HEMOGLOBIN A1C
Est. average glucose Bld gHb Est-mCnc: 123 mg/dL
Hgb A1c MFr Bld: 5.9 % — ABNORMAL HIGH (ref 4.8–5.6)

## 2020-11-30 LAB — TSH: TSH: 0.906 u[IU]/mL (ref 0.450–4.500)

## 2020-12-20 ENCOUNTER — Other Ambulatory Visit (HOSPITAL_COMMUNITY): Payer: Self-pay

## 2021-01-10 ENCOUNTER — Other Ambulatory Visit (HOSPITAL_COMMUNITY): Payer: Self-pay

## 2021-01-23 ENCOUNTER — Other Ambulatory Visit (HOSPITAL_COMMUNITY): Payer: Self-pay

## 2021-02-05 ENCOUNTER — Encounter: Payer: Self-pay | Admitting: Family Medicine

## 2021-02-05 NOTE — Telephone Encounter (Signed)
Needs office visit this week °

## 2021-02-07 ENCOUNTER — Ambulatory Visit: Payer: 59 | Admitting: Nurse Practitioner

## 2021-02-07 ENCOUNTER — Other Ambulatory Visit: Payer: Self-pay

## 2021-02-07 ENCOUNTER — Encounter: Payer: Self-pay | Admitting: Nurse Practitioner

## 2021-02-07 VITALS — BP 132/80 | HR 97 | Temp 97.6°F | Ht 66.0 in | Wt 304.0 lb

## 2021-02-07 DIAGNOSIS — T148XXA Other injury of unspecified body region, initial encounter: Secondary | ICD-10-CM

## 2021-02-07 DIAGNOSIS — S81801A Unspecified open wound, right lower leg, initial encounter: Secondary | ICD-10-CM | POA: Diagnosis not present

## 2021-02-07 MED ORDER — BACITRACIN-NEOMYCIN-POLYMYXIN 400-5-5000 EX OINT: 1.0000 "application " | TOPICAL_OINTMENT | Freq: Two times a day (BID) | CUTANEOUS | 0 refills | Status: AC

## 2021-02-07 MED ORDER — TRIAMCINOLONE ACETONIDE 0.1 % EX CREA: 1.0000 "application " | TOPICAL_CREAM | Freq: Two times a day (BID) | CUTANEOUS | 0 refills | Status: AC

## 2021-02-07 NOTE — Progress Notes (Signed)
° °  Subjective:    Patient ID: Amy Hunter, female    DOB: 1961-01-19, 61 y.o.   MRN: [id number]  HPI Patient here for a scratch that has not healed for ~10 days. Patient states that the area itches, burns and stings. Patient is concerned about the area because she has a hx of cellulitis to lower extremities. Patient states that the scratch occurred because of her scratching her legs in her sleep. Patient has very dry skins which itch a lot.  Patient denies fever, redness to the area, drainage from the area, or swelling.    Review of Systems  Skin:  Positive for rash.  All other systems reviewed and are negative.     Objective:   Physical Exam Constitutional:      General: She is not in acute distress.    Appearance: Normal appearance. She is normal weight. She is not ill-appearing or toxic-appearing.  Cardiovascular:     Rate and Rhythm: Normal rate and regular rhythm.     Pulses: Normal pulses.     Heart sounds: Normal heart sounds. No murmur heard. Pulmonary:     Effort: No respiratory distress.     Breath sounds: Normal breath sounds. No wheezing.  Musculoskeletal:     Comments: Multiple healing lesions noted to medial side of lower leg. No redness, no warmth to touch, non tender, no discharge noted.   Neurological:     Mental Status: She is alert.          Assessment & Plan:   1. Excoriation - Multiple healing scratch marks. Itching likely caused by dry skin.  - Due to hx of LE cellulitis and diabetes will provide abx ointment. - triamcinolone cream (KENALOG) 0.1 %; Apply 1 application topically 2 (two) times daily for 15 days.  Dispense: 30 g; Refill: 0 - neomycin-bacitracin-polymyxin (NEOSPORIN) ointment; Apply 1 application topically every 12 (twelve) hours.  Dispense: 15 g; Refill: 0 - Use hydrating moisturizer to lower extremities and cover with long sock to help with dry skin.  - RTC if you develop s/s of infection (swelling, redness, pain, drainage). - RTC  for follow up visit with Dr. Lorin Picket in April 2023

## 2021-02-11 ENCOUNTER — Other Ambulatory Visit (HOSPITAL_COMMUNITY): Payer: Self-pay

## 2021-02-12 ENCOUNTER — Encounter: Payer: Self-pay | Admitting: Family Medicine

## 2021-02-12 ENCOUNTER — Other Ambulatory Visit (HOSPITAL_COMMUNITY): Payer: Self-pay

## 2021-02-12 MED ORDER — PREDNISONE 20 MG PO TABS: ORAL_TABLET | ORAL | 0 refills | Status: DC

## 2021-02-12 MED ORDER — COLCHICINE 0.6 MG PO TABS
0.6000 mg | ORAL_TABLET | Freq: Two times a day (BID) | ORAL | 0 refills | Status: DC
  Filled 2021-02-12: qty 60, 30d supply, fill #0

## 2021-02-12 NOTE — Telephone Encounter (Signed)
Recommend prednisone 20 mg, 3 daily for 2 days then 2 daily for the next 5 days, #60  Also take colchicine 0.6 mg 1 twice daily till relief If any ongoing troubles or problems notify us

## 2021-02-12 NOTE — Telephone Encounter (Signed)
Patient informed of md message and instructions. Verbalized understanding. Prescriptions sent.

## 2021-02-28 ENCOUNTER — Other Ambulatory Visit (HOSPITAL_COMMUNITY): Payer: Self-pay

## 2021-03-11 ENCOUNTER — Encounter: Payer: Self-pay | Admitting: Family Medicine

## 2021-03-11 ENCOUNTER — Ambulatory Visit: Payer: 59 | Admitting: Family Medicine

## 2021-03-12 ENCOUNTER — Encounter: Payer: Self-pay | Admitting: Nurse Practitioner

## 2021-03-12 ENCOUNTER — Ambulatory Visit: Payer: 59 | Admitting: Nurse Practitioner

## 2021-03-12 ENCOUNTER — Other Ambulatory Visit: Payer: Self-pay

## 2021-03-12 VITALS — BP 130/76 | HR 87 | Temp 95.5°F | Wt 301.0 lb

## 2021-03-12 DIAGNOSIS — L02411 Cutaneous abscess of right axilla: Secondary | ICD-10-CM

## 2021-03-12 MED ORDER — SULFAMETHOXAZOLE-TRIMETHOPRIM 800-160 MG PO TABS: 1.0000 | ORAL_TABLET | Freq: Two times a day (BID) | ORAL | 0 refills | Status: DC

## 2021-03-12 NOTE — Progress Notes (Signed)
° °  Subjective:    Patient ID: Amy Hunter, female    DOB: 1960/09/15, 61 y.o.   MRN: [id number]  HPI Patient presents to clinic with a boil under her right arm near her armpit x5 days.  Patient first noticed a boil on Friday night or Saturday morning.  Has worsened since. Did have some drainage this morning.  Patient denies fever chills, body aches.   Review of Systems  Skin:  Positive for wound.  All other systems reviewed and are negative.     Objective:   Physical Exam Constitutional:      General: She is not in acute distress.    Appearance: Normal appearance. She is obese. She is not ill-appearing or toxic-appearing.  Skin:    General: Skin is warm.     Findings: Abscess and erythema present. No abrasion, acne, bruising, burn, ecchymosis, signs of injury, laceration, petechiae, rash or wound.     Comments: Abscess noted to right axilla.  Approximately 4.5 cm diameter of induration.  Approximately 4.5 cm diameter of redness noted.  Area draining purulent drainage from pinpoint opening.  Approximately 0.5 mL of drainage expressed.  Neurological:     General: No focal deficit present.     Mental Status: She is alert and oriented to person, place, and time.  Psychiatric:        Mood and Affect: Mood normal.        Behavior: Behavior normal.          Assessment & Plan:   1. Abscess of right axilla -We will cover patient with Bactrim DS twice daily x7 days. - WOUND CULTURE -Patient to return to clinic in 1 to 2 days if area not healing -We will consider lancing and drainage if necessary. -Go to emergency room if you develop fever chills or body aches or if area becomes increasingly worse.

## 2021-03-14 ENCOUNTER — Other Ambulatory Visit: Payer: Self-pay

## 2021-03-14 ENCOUNTER — Other Ambulatory Visit (HOSPITAL_COMMUNITY): Payer: Self-pay

## 2021-03-14 ENCOUNTER — Encounter: Payer: Self-pay | Admitting: Nurse Practitioner

## 2021-03-14 ENCOUNTER — Ambulatory Visit: Payer: 59 | Admitting: Nurse Practitioner

## 2021-03-14 ENCOUNTER — Other Ambulatory Visit: Payer: 59

## 2021-03-14 VITALS — BP 132/78 | HR 97 | Temp 96.6°F | Wt 290.0 lb

## 2021-03-14 DIAGNOSIS — L02411 Cutaneous abscess of right axilla: Secondary | ICD-10-CM | POA: Diagnosis not present

## 2021-03-14 MED ORDER — SULFAMETHOXAZOLE-TRIMETHOPRIM 800-160 MG PO TABS: 1.0000 | ORAL_TABLET | Freq: Two times a day (BID) | ORAL | 0 refills | Status: AC

## 2021-03-14 MED ORDER — HYDROCODONE-ACETAMINOPHEN 5-325 MG PO TABS: 1.0000 | ORAL_TABLET | ORAL | 0 refills | Status: AC | PRN

## 2021-03-14 NOTE — Progress Notes (Signed)
° °  Subjective:    Patient ID: Amy Hunter, female    DOB: 12/18/1960, 61 y.o.   MRN: [id number]  HPI Pt here for recheck on abscess under right arm.  Patient states that the pain is gotten slightly better but still burns.  Patient has not noticed any more drainage but states that this area is still there and has not gotten any smaller.  Pt still using Bactrim as prescribed.  Denies fever chills body aches.   Review of Systems  Skin:        Boil to right axilla  All other systems reviewed and are negative.     Objective:   Physical Exam Constitutional:      General: She is not in acute distress.    Appearance: Normal appearance. She is obese. She is not ill-appearing or toxic-appearing.     Comments: Central obesity noted  Skin:    General: Skin is warm.     Capillary Refill: Capillary refill takes less than 2 seconds.     Findings: Abscess present.     Comments: Abscess to right axilla approximately 4.5 cm in diameter.  Erythema noted.  Tenderness to palpation.  Drainage noted on palpation.  Neurological:     General: No focal deficit present.     Mental Status: She is alert and oriented to person, place, and time.  Psychiatric:        Mood and Affect: Mood normal.        Behavior: Behavior normal.          Assessment & Plan:   1. Abscess of right axilla -Abscess I&D packing in place. -We will extend antibiotic use for 5 additional days - sulfamethoxazole-trimethoprim (BACTRIM DS) 800-160 MG tablet; Take 1 tablet by mouth 2 (two) times daily for 5 days.  Dispense: 10 tablet; Refill: 0 -Return to clinic tomorrow for dressing change and evaluation of area. -May use Norco 5 mg / 325 mg as needed for pain for 5 days

## 2021-03-15 ENCOUNTER — Ambulatory Visit (INDEPENDENT_AMBULATORY_CARE_PROVIDER_SITE_OTHER): Payer: 59 | Admitting: Family Medicine

## 2021-03-15 ENCOUNTER — Ambulatory Visit: Payer: 59 | Admitting: Family Medicine

## 2021-03-15 ENCOUNTER — Encounter: Payer: Self-pay | Admitting: Family Medicine

## 2021-03-15 ENCOUNTER — Other Ambulatory Visit: Payer: Self-pay

## 2021-03-15 VITALS — BP 132/78 | HR 88 | Temp 98.2°F | Ht 66.0 in | Wt 296.0 lb

## 2021-03-15 DIAGNOSIS — L02411 Cutaneous abscess of right axilla: Secondary | ICD-10-CM | POA: Diagnosis not present

## 2021-03-15 LAB — SPECIMEN STATUS REPORT

## 2021-03-15 LAB — WOUND CULTURE: Organism ID, Bacteria: NONE SEEN

## 2021-03-15 NOTE — Progress Notes (Signed)
° °  Subjective:    Patient ID: Amy Hunter, female    DOB: February 08, 1960, 61 y.o.   MRN: [id number]  HPI Abscess under R armpit area follow up  Patient states the area is draining but is not causing any severe troubles She had does have a dressing on it The pain is much better   Review of Systems     Objective:   Physical Exam The area is improving.  Still has some pus.  Pus along with some sedation was expressed without difficulty Area of cellulitis is noted dressing was placed       Assessment & Plan:  Abscess-resolving-packing removed dressing placed-minimal cellulitis-if reoccurring may need surgical referral Follow-up next week

## 2021-03-20 ENCOUNTER — Other Ambulatory Visit: Payer: Self-pay | Admitting: *Deleted

## 2021-03-20 ENCOUNTER — Ambulatory Visit (INDEPENDENT_AMBULATORY_CARE_PROVIDER_SITE_OTHER): Payer: 59 | Admitting: Family Medicine

## 2021-03-20 ENCOUNTER — Other Ambulatory Visit: Payer: Self-pay

## 2021-03-20 DIAGNOSIS — L02411 Cutaneous abscess of right axilla: Secondary | ICD-10-CM | POA: Diagnosis not present

## 2021-03-20 DIAGNOSIS — I10 Essential (primary) hypertension: Secondary | ICD-10-CM

## 2021-03-20 DIAGNOSIS — Z79899 Other long term (current) drug therapy: Secondary | ICD-10-CM

## 2021-03-20 DIAGNOSIS — E785 Hyperlipidemia, unspecified: Secondary | ICD-10-CM

## 2021-03-20 DIAGNOSIS — E1169 Type 2 diabetes mellitus with other specified complication: Secondary | ICD-10-CM

## 2021-03-20 DIAGNOSIS — E119 Type 2 diabetes mellitus without complications: Secondary | ICD-10-CM

## 2021-03-20 DIAGNOSIS — E039 Hypothyroidism, unspecified: Secondary | ICD-10-CM

## 2021-03-20 NOTE — Patient Instructions (Signed)
Please do your labs one week before your office visit in late April  Call us if any problems Take care- Dr.Casia Corti

## 2021-03-20 NOTE — Progress Notes (Signed)
° °  Subjective:    Patient ID: Amy Hunter, female    DOB: 1960/07/05, 61 y.o.   MRN: [id number]  HPI Patient arrives for a follow up on abscess. Patient states the area is looking whole lot better. Here today for follow-up No longer draining Note no tenderness Still has some firm areas in it but it is gradually getting better  Review of Systems     Objective:   Physical Exam  The area has some residual redness and scarring with some thickness of the skin but no abscess noted      Assessment & Plan:  Doing much better Has already finished antibiotics No need for any further surgical procedure If it does recur may need general surgery to remove the whole area Follow-up for regular health issues and do lab work 1 week before follow-up

## 2021-04-04 ENCOUNTER — Other Ambulatory Visit (HOSPITAL_COMMUNITY): Payer: Self-pay

## 2021-04-26 ENCOUNTER — Encounter: Payer: Self-pay | Admitting: Family Medicine

## 2021-04-26 ENCOUNTER — Telehealth: Payer: Self-pay

## 2021-04-26 ENCOUNTER — Other Ambulatory Visit: Payer: Self-pay | Admitting: Nurse Practitioner

## 2021-04-26 MED ORDER — PROMETHAZINE-DM 6.25-15 MG/5ML PO SYRP: 5.0000 mL | ORAL_SOLUTION | Freq: Four times a day (QID) | ORAL | 0 refills | Status: DC | PRN

## 2021-04-26 NOTE — Telephone Encounter (Signed)
Erroneous encounter. Please disregard.

## 2021-04-29 ENCOUNTER — Other Ambulatory Visit (HOSPITAL_COMMUNITY): Payer: Self-pay

## 2021-05-01 ENCOUNTER — Other Ambulatory Visit: Payer: Self-pay | Admitting: Family Medicine

## 2021-05-01 ENCOUNTER — Other Ambulatory Visit (HOSPITAL_COMMUNITY): Payer: Self-pay

## 2021-05-03 ENCOUNTER — Encounter: Payer: Self-pay | Admitting: Family Medicine

## 2021-05-03 ENCOUNTER — Ambulatory Visit: Payer: 59 | Admitting: Family Medicine

## 2021-05-03 VITALS — BP 149/84 | HR 72 | Temp 97.2°F | Wt 302.0 lb

## 2021-05-03 DIAGNOSIS — J019 Acute sinusitis, unspecified: Secondary | ICD-10-CM

## 2021-05-03 MED ORDER — CEFDINIR 300 MG PO CAPS: 300.0000 mg | ORAL_CAPSULE | Freq: Two times a day (BID) | ORAL | 0 refills | Status: DC

## 2021-05-03 NOTE — Progress Notes (Signed)
? ?  Subjective:  ? ? Patient ID: Amy Hunter, female    DOB: 10-17-1960, 61 y.o.   MRN: [id number] ? ?Cough ? ?Patient is having cough since last Thursday. Both ears are hurting and eyes are runny. ?No fever ?No doe/sob ?No myalgias ? ?Review of Systems  ?Respiratory:  Positive for cough.   ? ?   ?Objective:  ? Physical Exam ? ? ?Gen-NAD not toxic ?TMS-normal bilateral ?T- normal no redness ?Chest-CTA respiratory rate normal no crackles ?CV RRR no murmur ?Skin-warm dry ?Neuro-grossly normal ? ?Patient took COVID it was negative ?   ?Assessment & Plan:  ?Acute rhinosinusitis ?Antibiotic prescribed ?Warning signs discussed ?Follow-up if ongoing trouble ? ? ?

## 2021-05-10 ENCOUNTER — Other Ambulatory Visit (HOSPITAL_COMMUNITY): Payer: Self-pay

## 2021-05-21 ENCOUNTER — Other Ambulatory Visit (HOSPITAL_COMMUNITY): Payer: Self-pay

## 2021-05-21 ENCOUNTER — Ambulatory Visit
Admission: EM | Admit: 2021-05-21 | Discharge: 2021-05-21 | Disposition: A | Discharge status: Home / Self Care | Payer: 59 | Attending: Family Medicine | Admitting: Family Medicine

## 2021-05-21 ENCOUNTER — Encounter: Payer: Self-pay | Admitting: Emergency Medicine

## 2021-05-21 ENCOUNTER — Ambulatory Visit (INDEPENDENT_AMBULATORY_CARE_PROVIDER_SITE_OTHER): Payer: PRIVATE HEALTH INSURANCE

## 2021-05-21 ENCOUNTER — Other Ambulatory Visit: Payer: Self-pay

## 2021-05-21 DIAGNOSIS — M25562 Pain in left knee: Secondary | ICD-10-CM | POA: Diagnosis not present

## 2021-05-21 DIAGNOSIS — W19XXXA Unspecified fall, initial encounter: Secondary | ICD-10-CM

## 2021-05-21 NOTE — ED Triage Notes (Addendum)
Pt reports slipped and tripped in parking lot at work on Sunday. Pt reports left knee pain ever since. Pt able to bear weight but reports with increased pain. Denies loc,hitting head, past surgeries on LLE, or being on blood thinners. ? ?Bruise noted to right knee and abrasion noted to left knee and right forearm. No obvious deformity noted. ?

## 2021-05-21 NOTE — ED Provider Notes (Signed)
?[facility] ? ? ? ?CSN: [id number] ?Arrival date & time: 05/21/21  [id number] ? ? ?  ? ?History   ?Chief Complaint ?Chief Complaint  ?Patient presents with  ? Fall  ? ? ?HPI ?Amy Hunter is a 61 y.o. female.  ? ?Patient presents today with 2-day history of left anterior knee pain, stiffness, pain with weightbearing after a fall predominantly onto the left knee 2 days ago in a parking lot.  She states she also sustained abrasion to the right forearm and right knee from the fall but these areas are not painful, mainly in the left knee is what is bothering her.  Has been trying Tylenol, ice with minimal relief so far.  Known history of osteoarthritis in the knee.  Denies numbness, tingling, swelling, discoloration. ? ? ?Past Medical History:  ?Diagnosis Date  ? Allergy   ? Anxiety   ? Arthritis   ? Complication of anesthesia   ? Depression   ? Diabetes mellitus   ? type 2  ? Family history of adverse reaction to anesthesia   ? mother got n/v  ? Hypertension   ? Hypothyroidism   ? PONV (postoperative nausea and vomiting)   ? Sleep apnea   ? no cpap does not tolerate   ? ? ?Patient Active Problem List  ? Diagnosis Date Noted  ? Hyperlipidemia associated with type 2 diabetes mellitus ([place]) 07/03/2020  ? Atopic dermatitis 01/03/2020  ? High risk medication use 01/03/2020  ? Essential hypertension 01/03/2020  ? Fatty liver 11/22/2018  ? Gout 11/04/2018  ? Elevated liver enzymes 11/04/2018  ? Type 2 diabetes mellitus without complication ([place]) [id number]  ? Hyperlipidemia 11/10/2012  ? Hypothyroidism 11/10/2012  ? Obstructive sleep apnea 05/03/2012  ? Dyspnea on exertion 03/30/2012  ? Morbid obesity ([place]) 03/30/2012  ? Varicose veins of lower extremities with other complications [id number]  ? Venous insufficiency 09/16/2011  ? TRIGGER FINGER 12/11/2008  ? ? ?Past Surgical History:  ?Procedure Laterality Date  ? CHOLECYSTECTOMY    ? HERNIA REPAIR    ? umbilical  Dr. Rosendo Gros [id number]  ? UMBILICAL HERNIA REPAIR N/A  07/07/2017  ? Procedure: OPEN UMBILICAL HERNIA REPAIR WITH MESH;  Surgeon: Ralene Ok, MD;  Location: [facility];  Service: General;  Laterality: N/A;  ? uterine ablation    ? VARICOSE VEIN SURGERY Bilateral   ? laser treatment--ligation  ? ? ?OB History   ? ? Gravida  ?1  ? Para  ?1  ? Term  ?1  ? Preterm  ?   ? AB  ?   ? Living  ?1  ?  ? ? SAB  ?   ? IAB  ?   ? Ectopic  ?   ? Multiple  ?   ? Live Births  ?   ?   ?  ?  ? ? ? ?Home Medications   ? ?Prior to Admission medications   ?Medication Sig Start Date End Date Taking? Authorizing Provider  ?allopurinol (ZYLOPRIM) 100 MG tablet Take 2 tablets (200 mg total) by mouth daily. 11/29/20   Kathyrn Drown, MD  ?ALPRAZolam Duanne Moron) 1 MG tablet TAKE 1 TABLET BY MOUTH 2 TIMES PER DAY AS NEEDED FOR ANXIETY OR SLEEP 02/22/19   Kathyrn Drown, MD  ?amLODipine (NORVASC) 5 MG tablet Take 1 tablet by mouth once daily 05/01/21   Kathyrn Drown, MD  ?cefdinir (OMNICEF) 300 MG capsule Take 1 capsule (300 mg total) by mouth 2 (two) times daily.  05/03/21   Kathyrn Drown, MD  ?colchicine 0.6 MG tablet Take 1 tablet (0.6 mg total) by mouth 2 (two) times daily. 02/12/21   Kathyrn Drown, MD  ?Cyanocobalamin (VITAMIN B-12) 2000 MCG TBCR Take 2,000 mcg by mouth daily.     [provider]  ?Dulaglutide 3 MG/0.5ML SOPN INJECT 3 MG UNDER THE SKIN ONCE WEEKLY 07/03/20 07/03/21  Kathyrn Drown, MD  ?levothyroxine (SYNTHROID) 200 MCG tablet Take 1 tablet by mouth daily except on Mondays take 1 & 1/2 tablets 11/29/20   Luking, Elayne Snare, MD  ?lisinopril (ZESTRIL) 2.5 MG tablet Take 1 tablet (2.5 mg total) by mouth every morning. 11/29/20   Kathyrn Drown, MD  ?metFORMIN (GLUCOPHAGE) 500 MG tablet Take 0.5 tablets (250 mg total) by mouth 2 (two) times daily. 11/29/20   Kathyrn Drown, MD  ?metoprolol succinate (TOPROL-XL) 50 MG 24 hr tablet Take 1 tablet (50 mg total) by mouth daily. Take with or immediately following a meal 11/29/20   Kathyrn Drown, MD  ?naproxen (NAPROSYN) 500 MG  tablet Take 1 tablet (500 mg total) by mouth 2 (two) times daily with a meal. 11/29/20   Luking, Elayne Snare, MD  ?neomycin-bacitracin-polymyxin (NEOSPORIN) ointment Apply 1 application topically every 12 (twelve) hours. ?Patient not taking: Reported on 05/03/2021 02/07/21   Ameduite, Trenton Gammon, NP  ?potassium chloride SA (KLOR-CON M) 20 MEQ tablet Take 1 tablet (20 mEq total) by mouth 2 (two) times daily. 11/29/20   Kathyrn Drown, MD  ?pravastatin (PRAVACHOL) 40 MG tablet Take 1 tablet (40 mg total) by mouth daily. 11/29/20   Kathyrn Drown, MD  ?promethazine-dextromethorphan (PROMETHAZINE-DM) 6.25-15 MG/5ML syrup Take 5 mLs by mouth 4 (four) times daily as needed for cough. ?Patient not taking: Reported on 05/03/2021 04/26/21   Nilda Simmer, NP  ?torsemide (DEMADEX) 20 MG tablet Take 3 tablets (60 mg total) by mouth every morning AND 3 tablets (60 mg total) every evening. 11/29/20   Kathyrn Drown, MD  ? ? ?Family History ?Family History  ?Problem Relation Age of Onset  ? Diabetes Mother   ? Hypertension Mother   ? Heart disease Mother   ? Breast cancer Mother   ? Lung cancer Mother   ? Heart disease Brother   ? Heart disease Father   ? Lung cancer Father   ? Brain cancer Father   ? Asthma Father   ? Lung cancer Maternal Grandmother   ? Colon cancer Paternal Grandmother   ? ? ?Social History ?Social History  ? ?Tobacco Use  ? Smoking status: Former  ?  Packs/day: 0.50  ?  Years: 15.00  ?  Pack years: 7.50  ?  Types: Cigarettes  ?  Quit date: 02/03/2010  ?  Years since quitting: 11.3  ? Smokeless tobacco: Never  ? Tobacco comments:  ?  pt reports she smoked "off and on" for the entire smoking years.   ?Vaping Use  ? Vaping Use: Never used  ?Substance Use Topics  ? Alcohol use: No  ? Drug use: No  ? ? ? ?Allergies   ?Benadryl [diphenhydramine hcl], Indomethacin, and Keflex [cephalexin] ? ? ?Review of Systems ?Review of Systems ?Per HPI ? ?Physical Exam ?Triage Vital Signs ?ED Triage Vitals [05/21/21 0855]  ?Enc Vitals  Group  ?   BP 134/80  ?   Pulse Rate 84  ?   Resp 18  ?   Temp 98.1 ?F (36.7 ?C)  ?   Temp  Source Oral  ?   SpO2 93 %  ?   Weight 296 lb (134.3 kg)  ?   Height 5\' 6"  (1.676 m)  ?   Head Circumference   ?   Peak Flow   ?   Pain Score 8  ?   Pain Loc   ?   Pain Edu?   ?   Excl. in [place]?   ? ?No data found. ? ?Updated Vital Signs ?BP 134/80 (BP Location: Right Arm)   Pulse 84   Temp 98.1 ?F (36.7 ?C) (Oral)   Resp 18   Ht 5\' 6"  (1.676 m)   Wt 296 lb (134.3 kg)   SpO2 93%   BMI 47.78 kg/m?  ? ?Visual Acuity ?Right Eye Distance:   ?Left Eye Distance:   ?Bilateral Distance:   ? ?Right Eye Near:   ?Left Eye Near:    ?Bilateral Near:    ? ?Physical Exam ?Vitals and nursing note reviewed.  ?Constitutional:   ?   Appearance: Normal appearance. She is not ill-appearing.  ?HENT:  ?   Head: Atraumatic.  ?Eyes:  ?   Extraocular Movements: Extraocular movements intact.  ?   Conjunctiva/sclera: Conjunctivae normal.  ?Cardiovascular:  ?   Rate and Rhythm: Normal rate and regular rhythm.  ?   Heart sounds: Normal heart sounds.  ?Pulmonary:  ?   Effort: Pulmonary effort is normal.  ?   Breath sounds: Normal breath sounds.  ?Musculoskeletal:     ?   General: Tenderness and signs of injury present. No swelling or deformity. Normal range of motion.  ?   Cervical back: Normal range of motion and neck supple.  ?   Comments: Anterior left knee tenderness to palpation without bony deformity palpable.  Range of motion intact but painful.  Mildly antalgic gait.  Negative McMurray's, drawer testing.  No joint instability  ?Skin: ?   General: Skin is warm and dry.  ?Neurological:  ?   Mental Status: She is alert and oriented to person, place, and time.  ?   Motor: No weakness.  ?Psychiatric:     ?   Mood and Affect: Mood normal.     ?   Thought Content: Thought content normal.     ?   Judgment: Judgment normal.  ? ? ? ?UC Treatments / Results  ?Labs ?(all labs ordered are listed, but only abnormal results are displayed) ?Labs Reviewed - No  data to display ? ?EKG ? ? ?Radiology ?DG Knee Complete 4 Views Left ? ?Result Date: 05/21/2021 ?CLINICAL DATA:  Left knee pain after fall. EXAM: LEFT KNEE - COMPLETE 4+ VIEW COMPARISON:  None. FINDINGS: No evide

## 2021-05-30 ENCOUNTER — Other Ambulatory Visit: Payer: Self-pay | Admitting: Family Medicine

## 2021-05-30 ENCOUNTER — Other Ambulatory Visit (HOSPITAL_COMMUNITY): Payer: Self-pay

## 2021-05-30 ENCOUNTER — Ambulatory Visit: Payer: 59 | Admitting: Family Medicine

## 2021-05-30 MED ORDER — COLCHICINE 0.6 MG PO TABS
0.6000 mg | ORAL_TABLET | Freq: Two times a day (BID) | ORAL | 1 refills | Status: DC
  Filled 2021-05-30: qty 60, 30d supply, fill #0
  Filled 2021-08-06: qty 60, 30d supply, fill #1

## 2021-06-13 ENCOUNTER — Other Ambulatory Visit (HOSPITAL_COMMUNITY): Payer: Self-pay

## 2021-06-13 ENCOUNTER — Other Ambulatory Visit: Payer: Self-pay | Admitting: Orthopedic Surgery

## 2021-06-13 ENCOUNTER — Other Ambulatory Visit (HOSPITAL_COMMUNITY): Payer: Self-pay | Admitting: Orthopedic Surgery

## 2021-06-13 DIAGNOSIS — S8992XA Unspecified injury of left lower leg, initial encounter: Secondary | ICD-10-CM

## 2021-07-03 LAB — COLOGUARD

## 2021-07-04 ENCOUNTER — Ambulatory Visit (HOSPITAL_COMMUNITY)
Admission: RE | Admit: 2021-07-04 | Discharge: 2021-07-04 | Disposition: A | Discharge status: Home / Self Care | Payer: PRIVATE HEALTH INSURANCE | Source: Ambulatory Visit | Attending: Orthopedic Surgery | Admitting: Orthopedic Surgery

## 2021-07-04 DIAGNOSIS — S83282A Other tear of lateral meniscus, current injury, left knee, initial encounter: Secondary | ICD-10-CM | POA: Diagnosis not present

## 2021-07-04 DIAGNOSIS — M1712 Unilateral primary osteoarthritis, left knee: Secondary | ICD-10-CM | POA: Diagnosis not present

## 2021-07-04 DIAGNOSIS — S8992XA Unspecified injury of left lower leg, initial encounter: Secondary | ICD-10-CM | POA: Diagnosis not present

## 2021-07-04 DIAGNOSIS — S83242A Other tear of medial meniscus, current injury, left knee, initial encounter: Secondary | ICD-10-CM | POA: Diagnosis not present

## 2021-07-04 DIAGNOSIS — M25462 Effusion, left knee: Secondary | ICD-10-CM | POA: Diagnosis not present

## 2021-07-08 ENCOUNTER — Other Ambulatory Visit (HOSPITAL_COMMUNITY): Payer: Self-pay

## 2021-07-09 ENCOUNTER — Other Ambulatory Visit (HOSPITAL_COMMUNITY): Payer: Self-pay

## 2021-07-12 ENCOUNTER — Other Ambulatory Visit (HOSPITAL_COMMUNITY): Payer: Self-pay | Admitting: Orthopedic Surgery

## 2021-07-16 ENCOUNTER — Other Ambulatory Visit: Payer: Self-pay | Admitting: Orthopedic Surgery

## 2021-07-16 ENCOUNTER — Other Ambulatory Visit (HOSPITAL_COMMUNITY): Payer: Self-pay | Admitting: Orthopedic Surgery

## 2021-07-16 DIAGNOSIS — S8992XA Unspecified injury of left lower leg, initial encounter: Secondary | ICD-10-CM

## 2021-07-16 DIAGNOSIS — S838X2A Sprain of other specified parts of left knee, initial encounter: Secondary | ICD-10-CM

## 2021-07-27 ENCOUNTER — Ambulatory Visit (HOSPITAL_COMMUNITY)
Admission: RE | Admit: 2021-07-27 | Discharge: 2021-07-27 | Disposition: A | Discharge status: Home / Self Care | Payer: PRIVATE HEALTH INSURANCE | Source: Ambulatory Visit | Attending: Orthopedic Surgery | Admitting: Orthopedic Surgery

## 2021-07-27 DIAGNOSIS — S838X2A Sprain of other specified parts of left knee, initial encounter: Secondary | ICD-10-CM | POA: Insufficient documentation

## 2021-07-27 DIAGNOSIS — S8992XA Unspecified injury of left lower leg, initial encounter: Secondary | ICD-10-CM | POA: Insufficient documentation

## 2021-08-07 ENCOUNTER — Encounter: Payer: Self-pay | Admitting: Nurse Practitioner

## 2021-08-07 ENCOUNTER — Other Ambulatory Visit (HOSPITAL_COMMUNITY): Payer: Self-pay

## 2021-08-07 ENCOUNTER — Ambulatory Visit: Payer: 59 | Admitting: Nurse Practitioner

## 2021-08-07 VITALS — BP 121/77 | HR 97 | Temp 97.7°F | Ht 66.0 in | Wt 298.0 lb

## 2021-08-07 DIAGNOSIS — E039 Hypothyroidism, unspecified: Secondary | ICD-10-CM | POA: Diagnosis not present

## 2021-08-07 DIAGNOSIS — I10 Essential (primary) hypertension: Secondary | ICD-10-CM | POA: Diagnosis not present

## 2021-08-07 DIAGNOSIS — N898 Other specified noninflammatory disorders of vagina: Secondary | ICD-10-CM | POA: Diagnosis not present

## 2021-08-07 DIAGNOSIS — E1169 Type 2 diabetes mellitus with other specified complication: Secondary | ICD-10-CM

## 2021-08-07 DIAGNOSIS — E119 Type 2 diabetes mellitus without complications: Secondary | ICD-10-CM

## 2021-08-07 DIAGNOSIS — E785 Hyperlipidemia, unspecified: Secondary | ICD-10-CM

## 2021-08-07 MED ORDER — KETOCONAZOLE 2 % EX CREA
1.0000 | TOPICAL_CREAM | Freq: Every day | CUTANEOUS | 0 refills | Status: AC
  Filled 2021-08-07: qty 15, 15d supply, fill #0

## 2021-08-07 MED ORDER — TORSEMIDE 20 MG PO TABS
ORAL_TABLET | ORAL | 1 refills | Status: DC
  Filled 2021-08-07: qty 540, 90d supply, fill #0
  Filled 2021-11-18: qty 540, 90d supply, fill #1

## 2021-08-07 MED ORDER — CLOBETASOL PROPIONATE 0.05 % EX CREA
1.0000 | TOPICAL_CREAM | Freq: Two times a day (BID) | CUTANEOUS | 0 refills | Status: DC
  Filled 2021-08-07: qty 30, 15d supply, fill #0

## 2021-08-07 MED ORDER — DULAGLUTIDE 3 MG/0.5ML ~~LOC~~ SOAJ
3.0000 mg | SUBCUTANEOUS | 5 refills | Status: DC
  Filled 2021-08-07: qty 2, 28d supply, fill #0
  Filled 2021-10-08: qty 2, 28d supply, fill #1
  Filled 2021-11-18: qty 2, 28d supply, fill #2

## 2021-08-07 MED ORDER — METFORMIN HCL 500 MG PO TABS
250.0000 mg | ORAL_TABLET | Freq: Two times a day (BID) | ORAL | 1 refills | Status: DC
  Filled 2021-08-07 (×2): qty 90, 90d supply, fill #0
  Filled 2021-11-18: qty 90, 90d supply, fill #1

## 2021-08-07 NOTE — Progress Notes (Signed)
Subjective:    Patient ID: Amy Hunter, female    DOB: 04/01/1960, 61 y.o.   MRN: [id number]  HPI  Patient here for diabetes follow-up.  Patient states that she has committed to a lifestyle change which she has decreased all sweets from her diet as well as simple carbohydrates.  Patient states that majority of her diet includes a protein and vegetables and fruit.  Patient states that she is actually lost 8 pounds and continues to lose weight.  Patient states that her fasting blood sugars over the past couple months have ranged from 98-140.  Patient denies any hypoglycemic events.  Patient does have complaints of vaginal itching.  Patient describes itchiness to the outside of her vagina in the folds of her skin between her legs.  Patient states that she was prescribed clobetasol and ketoconazole a few years back that help.  Patient would like to have that cream again to help with her issues.  Patient states that she has tried Vagisil over-the-counter and Monistat over-the-counter all of which have not been helpful.  Patient states that her and her great nieces will plan to increase their exercise to help with greater weight loss.  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  Review of Systems  Genitourinary:        Vaginal itching  All other systems reviewed and are negative.      Objective:   Physical Exam Vitals reviewed.  Constitutional:      General: She is not in acute distress.    Appearance: Normal appearance. She is obese. She is not ill-appearing, toxic-appearing or diaphoretic.  HENT:     Head: Normocephalic and atraumatic.  Cardiovascular:     Rate and Rhythm: Normal rate and regular rhythm.     Pulses: Normal pulses.     Heart sounds: Normal heart sounds. No murmur heard. Pulmonary:     Effort: Pulmonary effort is normal. No respiratory distress.     Breath sounds: Normal breath sounds. No wheezing.  Genitourinary:    Comments: Patient declined  vaginal exam today Musculoskeletal:     Comments: Grossly intact  Skin:    General: Skin is warm.     Capillary Refill: Capillary refill takes less than 2 seconds.  Neurological:     Mental Status: She is alert.     Comments: Grossly intact  Psychiatric:        Mood and Affect: Mood normal.        Behavior: Behavior normal.           Assessment & Plan:   1. Type 2 diabetes mellitus without complication, without long-term current use of insulin (HCC) -Last A1c was 5.9.  Goal of A1c less than 7 met -Patient continue taking medications as prescribed - Dulaglutide 3 MG/0.5ML SOPN; Inject 3 mg into the skin once a week.  Dispense: 2 mL; Refill: 5 - metFORMIN (GLUCOPHAGE) 500 MG tablet; Take 0.5 tablets (250 mg total) by mouth 2 (two) times daily.  Dispense: 90 tablet; Refill: 1 - HgB A1c - CMP14+EGFR -Return to clinic in 3 months  2. Essential hypertension -Blood pressure continues to be well controlled.  Blood pressure today is 121/77.  Continue taking medication as prescribed - torsemide (DEMADEX) 20 MG tablet; Take 3 tablets (60 mg total) by mouth every morning AND 3 tablets (60 mg total) every evening.  Dispense: 540 tablet; Refill: 1 - CMP14+EGFR -Return to clinic 3 months  3. Vaginal itching -Since patient has  success with ketoconazole clobetasol cream the last time she had this issue we will trial it again. - ketoconazole (NIZORAL) 2 % cream; Apply 1 Application topically daily.  Dispense: 15 g; Refill: 0 - clobetasol cream (TEMOVATE) 0.05 %; Apply 1 Application topically 2 (two) times daily.  Dispense: 30 g; Refill: 0 -Return to clinic if symptoms do not resolve  4. Hyperlipidemia associated with type 2 diabetes mellitus ([place]) - Lipid panel  5. Hypothyroidism, unspecified type - TSH + free T4     Note:  This document was prepared using Dragon voice recognition software and may include unintentional dictation errors. Note - This record has been created using  [employer].  Chart creation errors have been sought, but may not always  have been located. Such creation errors do not reflect on  the standard of medical care.

## 2021-08-08 ENCOUNTER — Other Ambulatory Visit (HOSPITAL_COMMUNITY): Payer: Self-pay

## 2021-08-08 DIAGNOSIS — E785 Hyperlipidemia, unspecified: Secondary | ICD-10-CM | POA: Diagnosis not present

## 2021-08-08 DIAGNOSIS — I10 Essential (primary) hypertension: Secondary | ICD-10-CM | POA: Diagnosis not present

## 2021-08-08 DIAGNOSIS — E1169 Type 2 diabetes mellitus with other specified complication: Secondary | ICD-10-CM | POA: Diagnosis not present

## 2021-08-08 DIAGNOSIS — E119 Type 2 diabetes mellitus without complications: Secondary | ICD-10-CM | POA: Diagnosis not present

## 2021-08-09 ENCOUNTER — Other Ambulatory Visit (HOSPITAL_COMMUNITY): Payer: Self-pay

## 2021-08-09 LAB — CMP14+EGFR
ALT: 49 IU/L — ABNORMAL HIGH (ref 0–32)
AST: 33 IU/L (ref 0–40)
Albumin/Globulin Ratio: 1.8 (ref 1.2–2.2)
Albumin: 4.4 g/dL (ref 3.8–4.8)
Alkaline Phosphatase: 161 IU/L — ABNORMAL HIGH (ref 44–121)
BUN/Creatinine Ratio: 17 (ref 12–28)
BUN: 11 mg/dL (ref 8–27)
Bilirubin Total: 0.3 mg/dL (ref 0.0–1.2)
CO2: 24 mmol/L (ref 20–29)
Calcium: 8.5 mg/dL — ABNORMAL LOW (ref 8.7–10.3)
Chloride: 103 mmol/L (ref 96–106)
Creatinine, Ser: 0.63 mg/dL (ref 0.57–1.00)
Globulin, Total: 2.4 g/dL (ref 1.5–4.5)
Glucose: 121 mg/dL — ABNORMAL HIGH (ref 70–99)
Potassium: 4.1 mmol/L (ref 3.5–5.2)
Sodium: 141 mmol/L (ref 134–144)
Total Protein: 6.8 g/dL (ref 6.0–8.5)
eGFR: 101 mL/min/{1.73_m2} (ref >59)

## 2021-08-09 LAB — HEMOGLOBIN A1C
Est. average glucose Bld gHb Est-mCnc: 137 mg/dL
Hgb A1c MFr Bld: 6.4 % — ABNORMAL HIGH (ref 4.8–5.6)

## 2021-08-09 LAB — LIPID PANEL
Chol/HDL Ratio: 4.1 ratio (ref 0.0–4.4)
Cholesterol, Total: 168 mg/dL (ref 100–199)
HDL: 41 mg/dL (ref >39)
LDL Chol Calc (NIH): 103 mg/dL — ABNORMAL HIGH (ref 0–99)
Triglycerides: 134 mg/dL (ref 0–149)
VLDL Cholesterol Cal: 24 mg/dL (ref 5–40)

## 2021-08-13 ENCOUNTER — Other Ambulatory Visit: Payer: Self-pay | Admitting: Family Medicine

## 2021-08-21 ENCOUNTER — Other Ambulatory Visit: Payer: Self-pay | Admitting: Family Medicine

## 2021-08-21 DIAGNOSIS — I10 Essential (primary) hypertension: Secondary | ICD-10-CM

## 2021-08-22 ENCOUNTER — Other Ambulatory Visit (HOSPITAL_COMMUNITY): Payer: Self-pay

## 2021-08-22 MED ORDER — METOPROLOL SUCCINATE ER 50 MG PO TB24
50.0000 mg | ORAL_TABLET | Freq: Every day | ORAL | 1 refills | Status: DC
  Filled 2021-08-22: qty 90, 90d supply, fill #0
  Filled 2021-11-18: qty 90, 90d supply, fill #1

## 2021-10-08 ENCOUNTER — Other Ambulatory Visit (HOSPITAL_COMMUNITY): Payer: Self-pay

## 2021-10-08 ENCOUNTER — Other Ambulatory Visit: Payer: Self-pay | Admitting: Family Medicine

## 2021-10-08 DIAGNOSIS — I10 Essential (primary) hypertension: Secondary | ICD-10-CM

## 2021-10-08 DIAGNOSIS — E039 Hypothyroidism, unspecified: Secondary | ICD-10-CM

## 2021-10-09 ENCOUNTER — Other Ambulatory Visit (HOSPITAL_COMMUNITY): Payer: Self-pay

## 2021-10-09 ENCOUNTER — Encounter: Payer: Self-pay | Admitting: Nurse Practitioner

## 2021-10-09 MED ORDER — LEVOTHYROXINE SODIUM 200 MCG PO TABS
ORAL_TABLET | ORAL | 1 refills | Status: DC
  Filled 2021-10-09: qty 96, 90d supply, fill #0
  Filled 2022-01-19: qty 96, 90d supply, fill #1

## 2021-10-09 MED ORDER — LISINOPRIL 2.5 MG PO TABS
2.5000 mg | ORAL_TABLET | Freq: Every morning | ORAL | 1 refills | Status: DC
  Filled 2021-10-09: qty 90, 90d supply, fill #0
  Filled 2022-01-19: qty 90, 90d supply, fill #1

## 2021-10-09 MED ORDER — COLCHICINE 0.6 MG PO TABS
0.6000 mg | ORAL_TABLET | Freq: Two times a day (BID) | ORAL | 0 refills | Status: DC
  Filled 2021-10-09: qty 60, 30d supply, fill #0

## 2021-10-09 NOTE — Telephone Encounter (Signed)
Nurses I would recommend combination Loratadine 10 mg 1 daily, 30, 2 refills Flonase nasal spray 2 sprays each nostril once daily, 1 vial, 6 refills And also Astelin nasal spray 2 sprays each nostril twice daily, 1 vial, 6 refills  I would recommend doing these allergy medicines on a daily basis over the course of the next 3 to 4 weeks then may stop  It could take 3 to 4 days to see improvement but should be dramatically better over the next week if ongoing troubles follow-up

## 2021-10-10 MED ORDER — FLUTICASONE PROPIONATE 50 MCG/ACT NA SUSP: NASAL | 6 refills | Status: AC

## 2021-10-10 MED ORDER — AZELASTINE HCL 0.1 % NA SOLN: NASAL | 6 refills | Status: DC

## 2021-10-10 MED ORDER — LORATADINE 10 MG PO TABS: ORAL_TABLET | ORAL | 2 refills | Status: DC

## 2021-11-18 ENCOUNTER — Other Ambulatory Visit (HOSPITAL_COMMUNITY): Payer: Self-pay

## 2021-11-18 ENCOUNTER — Other Ambulatory Visit: Payer: Self-pay | Admitting: Family Medicine

## 2021-11-18 DIAGNOSIS — M109 Gout, unspecified: Secondary | ICD-10-CM

## 2021-11-18 DIAGNOSIS — I10 Essential (primary) hypertension: Secondary | ICD-10-CM

## 2021-11-19 ENCOUNTER — Other Ambulatory Visit (HOSPITAL_COMMUNITY): Payer: Self-pay

## 2021-11-20 ENCOUNTER — Other Ambulatory Visit (HOSPITAL_COMMUNITY): Payer: Self-pay

## 2021-11-20 MED ORDER — POTASSIUM CHLORIDE CRYS ER 20 MEQ PO TBCR
20.0000 meq | EXTENDED_RELEASE_TABLET | Freq: Two times a day (BID) | ORAL | 0 refills | Status: DC
  Filled 2021-11-20: qty 180, 90d supply, fill #0

## 2021-11-20 MED ORDER — NAPROXEN 500 MG PO TABS
500.0000 mg | ORAL_TABLET | Freq: Two times a day (BID) | ORAL | 0 refills | Status: DC
  Filled 2021-11-20: qty 180, 90d supply, fill #0

## 2021-11-20 MED ORDER — ALLOPURINOL 100 MG PO TABS
200.0000 mg | ORAL_TABLET | Freq: Every day | ORAL | 0 refills | Status: DC
  Filled 2021-11-20: qty 180, 90d supply, fill #0

## 2021-11-20 MED ORDER — COLCHICINE 0.6 MG PO TABS
0.6000 mg | ORAL_TABLET | Freq: Two times a day (BID) | ORAL | 0 refills | Status: DC
  Filled 2021-11-20: qty 60, 30d supply, fill #0

## 2021-12-03 ENCOUNTER — Encounter: Payer: Self-pay | Admitting: Nurse Practitioner

## 2021-12-03 ENCOUNTER — Ambulatory Visit: Payer: 59 | Admitting: Nurse Practitioner

## 2021-12-03 VITALS — BP 122/82 | Ht 66.0 in | Wt 302.6 lb

## 2021-12-03 DIAGNOSIS — N644 Mastodynia: Secondary | ICD-10-CM

## 2021-12-03 DIAGNOSIS — Z1231 Encounter for screening mammogram for malignant neoplasm of breast: Secondary | ICD-10-CM | POA: Diagnosis not present

## 2021-12-03 MED ORDER — SULFAMETHOXAZOLE-TRIMETHOPRIM 800-160 MG PO TABS: 1.0000 | ORAL_TABLET | Freq: Two times a day (BID) | ORAL | 0 refills | Status: AC

## 2021-12-03 NOTE — Progress Notes (Signed)
   Subjective:    Patient ID: Amy Hunter, female    DOB: 07-09-1960, 61 y.o.   MRN: [id number]  HPI  Patient arrives with cellulitis in both breast. Patient stated they started aching over the weekend but started hurting worse yesterday.  Patient reports mild redness to the area. Patient also states that she feels like the right breast is fuller than than the left breast.   Patient denies any fevers, chills, body aches, nipple discharge, open wounds.    Review of Systems  Constitutional:        Bilateral breast pain  All other systems reviewed and are negative.      Objective:   Physical Exam Chest:     Chest wall: Tenderness present. No mass, lacerations, deformity, swelling, crepitus or edema. There is no dullness to percussion.  Breasts:    Breasts are symmetrical.     Right: Tenderness present. No swelling, bleeding, inverted nipple, mass, nipple discharge or skin change.     Left: Tenderness present. No swelling, bleeding, inverted nipple, mass, nipple discharge or skin change.     Comments: Bilateral breast mildly erythremic. Tender to palpation.  No massed noted. No lesions noted. No discharge noted. Not warm to touch. No swelling noted.  Lymphadenopathy:     Upper Body:     Right upper body: No supraclavicular, axillary or pectoral adenopathy.     Left upper body: No supraclavicular, axillary or pectoral adenopathy.           Assessment & Plan:   1. Breast pain in female - Possibly cellulitis vs musculoskeletal. However, due to patient's history of DM Type 2 and history of cellulitis infections will treat with Bactrim. If pain not resolved with 7 days of abx, stop treatment and return to clinic for re-evaluation. - sulfamethoxazole-trimethoprim (BACTRIM DS) 800-160 MG tablet; Take 1 tablet by mouth 2 (two) times daily for 10 days.  Dispense: 20 tablet; Refill: 0 -Return to clinic if symptoms not better within 7 days.   2. Encounter for screening mammogram  for malignant neoplasm of breast - Mammogram screening overdue. - MM DIGITAL SCREENING BILATERAL  Return to clinic for well women exam with 4 weeks.

## 2022-01-20 ENCOUNTER — Other Ambulatory Visit: Payer: Self-pay | Admitting: Family Medicine

## 2022-01-20 ENCOUNTER — Other Ambulatory Visit: Payer: Self-pay

## 2022-02-19 ENCOUNTER — Other Ambulatory Visit (HOSPITAL_COMMUNITY): Payer: Self-pay

## 2022-02-19 ENCOUNTER — Telehealth: Payer: Self-pay | Admitting: Family Medicine

## 2022-02-19 ENCOUNTER — Ambulatory Visit: Payer: Commercial Managed Care - PPO | Admitting: Family Medicine

## 2022-02-19 VITALS — BP 121/75 | HR 103 | Temp 98.6°F | Ht 66.0 in | Wt 306.4 lb

## 2022-02-19 DIAGNOSIS — L02412 Cutaneous abscess of left axilla: Secondary | ICD-10-CM | POA: Diagnosis not present

## 2022-02-19 DIAGNOSIS — I10 Essential (primary) hypertension: Secondary | ICD-10-CM

## 2022-02-19 DIAGNOSIS — E119 Type 2 diabetes mellitus without complications: Secondary | ICD-10-CM | POA: Diagnosis not present

## 2022-02-19 MED ORDER — LORATADINE 10 MG PO TABS
10.0000 mg | ORAL_TABLET | Freq: Every day | ORAL | 0 refills | Status: AC
  Filled 2022-02-19: qty 90, 90d supply, fill #0

## 2022-02-19 MED ORDER — AMLODIPINE BESYLATE 5 MG PO TABS
5.0000 mg | ORAL_TABLET | Freq: Every day | ORAL | 0 refills | Status: DC
  Filled 2022-02-19: qty 90, 90d supply, fill #0

## 2022-02-19 MED ORDER — AZELASTINE HCL 0.1 % NA SOLN
2.0000 | Freq: Two times a day (BID) | NASAL | 6 refills | Status: AC
  Filled 2022-02-19: qty 30, 30d supply, fill #0

## 2022-02-19 MED ORDER — LISINOPRIL 2.5 MG PO TABS
2.5000 mg | ORAL_TABLET | Freq: Every morning | ORAL | 1 refills | Status: DC
  Filled 2022-02-19: qty 90, 90d supply, fill #0

## 2022-02-19 MED ORDER — AMOXICILLIN-POT CLAVULANATE 875-125 MG PO TABS: 1.0000 | ORAL_TABLET | Freq: Two times a day (BID) | ORAL | 0 refills | Status: DC

## 2022-02-19 MED ORDER — DULAGLUTIDE 3 MG/0.5ML ~~LOC~~ SOAJ
3.0000 mg | SUBCUTANEOUS | 1 refills | Status: DC
  Filled 2022-02-19 – 2022-05-19 (×2): qty 2, 28d supply, fill #0
  Filled 2022-05-19: qty 2, 28d supply, fill #1

## 2022-02-19 NOTE — Telephone Encounter (Signed)
Nurses I was too busy yesterday afternoon to call the surgeon regarding her abscess under the left axilla.  Please call Dr. Krystal Eaton office asked them if they be willing to work her in in the near future if possible this week due to an abscess in the left axilla.

## 2022-02-19 NOTE — Progress Notes (Signed)
   Subjective:    Patient ID: Amy Hunter, female    DOB: 1960/03/31, 62 y.o.   MRN: [id number]  HPI Patient states she has a boil under left arm. Patient states she is concerned that she may have cellulitis of the lower right leg. Patient relates some discomfort in the left lower leg and underneath the left axilla left lower leg is more of a cellulitis under the left axilla is a small abscess she denies any fever chills or other problems currently   Review of Systems     Objective:   Physical Exam Lungs clear heart regular she does have a small abscess underneath the left arm and cellulitis left lower leg       Assessment & Plan:  Abscess Antibiotic prescribed Warm compresses Referral to surgery Lab work before next visit We will get her in with surgery as quickly as possible  Patient will do a follow-up office visit for her standard health issues within the next 30 days lab work ordered

## 2022-02-20 ENCOUNTER — Ambulatory Visit: Payer: Commercial Managed Care - PPO | Admitting: Family Medicine

## 2022-02-20 ENCOUNTER — Other Ambulatory Visit: Payer: Self-pay

## 2022-02-20 NOTE — Telephone Encounter (Signed)
Contacted Dr.Bridges office and they had an appt with Dr.Jenkins at 10:45 or Tuesday. Contacted pt and she is unable to go today due to having to work. Called back to Dr.Bridges office and made appt for Tuesday at 10 AM. Also placed referral in for general surgery. Nurse from General Surgery will contact patient.

## 2022-02-23 ENCOUNTER — Other Ambulatory Visit: Payer: Self-pay | Admitting: Family Medicine

## 2022-02-23 MED ORDER — COLCHICINE 0.6 MG PO TABS: ORAL_TABLET | ORAL | 0 refills | Status: DC

## 2022-02-24 ENCOUNTER — Telehealth: Payer: Self-pay | Admitting: Family Medicine

## 2022-02-24 ENCOUNTER — Other Ambulatory Visit: Payer: Self-pay | Admitting: Family Medicine

## 2022-02-24 NOTE — Telephone Encounter (Signed)
The patient relates that she did test 3 times per week with her workplace She happened to be positive on Friday Had some slight symptoms Thursday and Friday Saturday Sunday was not bad She feels somewhat better today She states she is going back to work on Wednesday Hold off on any Paxlovid currently-she will let us know if she is getting worse

## 2022-02-24 NOTE — Telephone Encounter (Signed)
FYI-patient was told to call back today she is better with the medication but still has to hold on to items to walk. She also tested positive for Covid

## 2022-02-24 NOTE — Telephone Encounter (Signed)
FYI

## 2022-02-25 ENCOUNTER — Encounter: Payer: Self-pay | Admitting: Family Medicine

## 2022-02-25 ENCOUNTER — Ambulatory Visit: Payer: Commercial Managed Care - PPO | Admitting: Surgery

## 2022-02-28 NOTE — Telephone Encounter (Signed)
Patient notified of provider's recommendations and verbalized understanding.

## 2022-02-28 NOTE — Telephone Encounter (Signed)
Nurses I do not necessarily feel Amy Hunter needs to be seen today hopefully this will gradually get better over the next few days  If she is not doing significantly better by early next week please have her communicate to [place] and then please work her into my schedule for this coming week  She does have a follow-up visit on 19 February She will need to do blood work before that visit Blood work to include Z6O, metabolic 7, lipid, liver, urine ACR, uric acid Diagnosis hyperlipidemia diabetes gout  She can do this blood work and urine at her convenience in the near future

## 2022-03-03 ENCOUNTER — Other Ambulatory Visit: Payer: Self-pay | Admitting: Family Medicine

## 2022-03-03 DIAGNOSIS — I10 Essential (primary) hypertension: Secondary | ICD-10-CM

## 2022-03-04 ENCOUNTER — Other Ambulatory Visit (HOSPITAL_COMMUNITY): Payer: Self-pay

## 2022-03-04 MED ORDER — POTASSIUM CHLORIDE CRYS ER 20 MEQ PO TBCR
20.0000 meq | EXTENDED_RELEASE_TABLET | Freq: Two times a day (BID) | ORAL | 0 refills | Status: DC
  Filled 2022-03-04: qty 180, 90d supply, fill #0

## 2022-03-04 MED ORDER — METOPROLOL SUCCINATE ER 50 MG PO TB24
50.0000 mg | ORAL_TABLET | Freq: Every day | ORAL | 1 refills | Status: DC
  Filled 2022-03-04: qty 90, 90d supply, fill #0

## 2022-03-12 ENCOUNTER — Other Ambulatory Visit: Payer: Self-pay | Admitting: Family Medicine

## 2022-03-12 ENCOUNTER — Other Ambulatory Visit: Payer: Self-pay

## 2022-03-12 DIAGNOSIS — M109 Gout, unspecified: Secondary | ICD-10-CM

## 2022-03-13 ENCOUNTER — Other Ambulatory Visit: Payer: Self-pay

## 2022-03-13 ENCOUNTER — Other Ambulatory Visit (HOSPITAL_COMMUNITY): Payer: Self-pay

## 2022-03-13 MED ORDER — NAPROXEN 500 MG PO TABS
500.0000 mg | ORAL_TABLET | Freq: Two times a day (BID) | ORAL | 0 refills | Status: DC
  Filled 2022-03-13: qty 180, 90d supply, fill #0

## 2022-03-13 MED ORDER — ALLOPURINOL 100 MG PO TABS
200.0000 mg | ORAL_TABLET | Freq: Every day | ORAL | 0 refills | Status: DC
  Filled 2022-03-13: qty 180, 90d supply, fill #0

## 2022-03-17 ENCOUNTER — Telehealth: Payer: Self-pay | Admitting: Family Medicine

## 2022-03-17 ENCOUNTER — Other Ambulatory Visit: Payer: Self-pay

## 2022-03-17 NOTE — Telephone Encounter (Signed)
Patient requesting refill on torsemide 20 mg  to be sent to [facility] for a few pills because is completely out and send the rest to [place] last filled 08/07/21

## 2022-03-19 ENCOUNTER — Other Ambulatory Visit: Payer: Self-pay

## 2022-03-19 ENCOUNTER — Other Ambulatory Visit: Payer: Self-pay | Admitting: Family Medicine

## 2022-03-19 ENCOUNTER — Other Ambulatory Visit (HOSPITAL_COMMUNITY): Payer: Self-pay

## 2022-03-19 DIAGNOSIS — E119 Type 2 diabetes mellitus without complications: Secondary | ICD-10-CM

## 2022-03-19 DIAGNOSIS — I10 Essential (primary) hypertension: Secondary | ICD-10-CM

## 2022-03-19 MED ORDER — TORSEMIDE 20 MG PO TABS
ORAL_TABLET | ORAL | 0 refills | Status: DC
  Filled 2022-03-19: qty 540, 90d supply, fill #0

## 2022-03-20 ENCOUNTER — Other Ambulatory Visit (HOSPITAL_COMMUNITY): Payer: Self-pay

## 2022-03-20 ENCOUNTER — Other Ambulatory Visit: Payer: Self-pay | Admitting: Family Medicine

## 2022-03-20 ENCOUNTER — Other Ambulatory Visit: Payer: Self-pay

## 2022-03-20 DIAGNOSIS — E785 Hyperlipidemia, unspecified: Secondary | ICD-10-CM | POA: Diagnosis not present

## 2022-03-20 DIAGNOSIS — Z79899 Other long term (current) drug therapy: Secondary | ICD-10-CM | POA: Diagnosis not present

## 2022-03-20 DIAGNOSIS — E1169 Type 2 diabetes mellitus with other specified complication: Secondary | ICD-10-CM | POA: Diagnosis not present

## 2022-03-20 DIAGNOSIS — E119 Type 2 diabetes mellitus without complications: Secondary | ICD-10-CM | POA: Diagnosis not present

## 2022-03-20 DIAGNOSIS — I10 Essential (primary) hypertension: Secondary | ICD-10-CM | POA: Diagnosis not present

## 2022-03-20 DIAGNOSIS — M109 Gout, unspecified: Secondary | ICD-10-CM

## 2022-03-20 MED ORDER — METFORMIN HCL 500 MG PO TABS
250.0000 mg | ORAL_TABLET | Freq: Two times a day (BID) | ORAL | 0 refills | Status: DC
  Filled 2022-03-20: qty 90, 90d supply, fill #0

## 2022-03-21 LAB — HEPATIC FUNCTION PANEL
ALT: 62 IU/L — ABNORMAL HIGH (ref 0–32)
AST: 44 IU/L — ABNORMAL HIGH (ref 0–40)
Albumin: 4.2 g/dL (ref 3.9–4.9)
Alkaline Phosphatase: 168 IU/L — ABNORMAL HIGH (ref 44–121)
Bilirubin Total: 0.2 mg/dL (ref 0.0–1.2)
Bilirubin, Direct: 0.12 mg/dL (ref 0.00–0.40)
Total Protein: 6.7 g/dL (ref 6.0–8.5)

## 2022-03-21 LAB — LIPID PANEL
Chol/HDL Ratio: 4.2 ratio (ref 0.0–4.4)
Cholesterol, Total: 168 mg/dL (ref 100–199)
HDL: 40 mg/dL (ref >39)
LDL Chol Calc (NIH): 109 mg/dL — ABNORMAL HIGH (ref 0–99)
Triglycerides: 103 mg/dL (ref 0–149)
VLDL Cholesterol Cal: 19 mg/dL (ref 5–40)

## 2022-03-21 LAB — BASIC METABOLIC PANEL
BUN/Creatinine Ratio: 25 (ref 12–28)
BUN: 15 mg/dL (ref 8–27)
CO2: 23 mmol/L (ref 20–29)
Calcium: 9.1 mg/dL (ref 8.7–10.3)
Chloride: 105 mmol/L (ref 96–106)
Creatinine, Ser: 0.6 mg/dL (ref 0.57–1.00)
Glucose: 143 mg/dL — ABNORMAL HIGH (ref 70–99)
Potassium: 4.3 mmol/L (ref 3.5–5.2)
Sodium: 142 mmol/L (ref 134–144)
eGFR: 101 mL/min/{1.73_m2} (ref >59)

## 2022-03-21 LAB — MICROALBUMIN / CREATININE URINE RATIO
Creatinine, Urine: 101.2 mg/dL
Microalb/Creat Ratio: 11 mg/g creat (ref 0–29)
Microalbumin, Urine: 11.1 ug/mL

## 2022-03-21 LAB — URIC ACID: Uric Acid: 7.2 mg/dL (ref 3.0–7.2)

## 2022-03-21 LAB — HEMOGLOBIN A1C
Est. average glucose Bld gHb Est-mCnc: 151 mg/dL
Hgb A1c MFr Bld: 6.9 % — ABNORMAL HIGH (ref 4.8–5.6)

## 2022-03-24 ENCOUNTER — Other Ambulatory Visit: Payer: Self-pay

## 2022-03-24 ENCOUNTER — Other Ambulatory Visit (HOSPITAL_COMMUNITY): Payer: Self-pay

## 2022-03-24 ENCOUNTER — Encounter: Payer: Self-pay | Admitting: Family Medicine

## 2022-03-24 ENCOUNTER — Ambulatory Visit: Payer: Commercial Managed Care - PPO | Admitting: Family Medicine

## 2022-03-24 VITALS — BP 130/81 | Wt 299.4 lb

## 2022-03-24 DIAGNOSIS — E7849 Other hyperlipidemia: Secondary | ICD-10-CM | POA: Diagnosis not present

## 2022-03-24 DIAGNOSIS — M109 Gout, unspecified: Secondary | ICD-10-CM

## 2022-03-24 DIAGNOSIS — Z1211 Encounter for screening for malignant neoplasm of colon: Secondary | ICD-10-CM | POA: Diagnosis not present

## 2022-03-24 DIAGNOSIS — E039 Hypothyroidism, unspecified: Secondary | ICD-10-CM

## 2022-03-24 DIAGNOSIS — E785 Hyperlipidemia, unspecified: Secondary | ICD-10-CM | POA: Diagnosis not present

## 2022-03-24 DIAGNOSIS — E119 Type 2 diabetes mellitus without complications: Secondary | ICD-10-CM

## 2022-03-24 DIAGNOSIS — E1169 Type 2 diabetes mellitus with other specified complication: Secondary | ICD-10-CM | POA: Diagnosis not present

## 2022-03-24 DIAGNOSIS — I10 Essential (primary) hypertension: Secondary | ICD-10-CM

## 2022-03-24 MED ORDER — METFORMIN HCL 500 MG PO TABS
250.0000 mg | ORAL_TABLET | Freq: Two times a day (BID) | ORAL | 1 refills | Status: DC
  Filled 2022-03-24 – 2022-06-23 (×3): qty 90, 90d supply, fill #0
  Filled 2022-09-24: qty 90, 90d supply, fill #1

## 2022-03-24 MED ORDER — ALPRAZOLAM 0.5 MG PO TABS
ORAL_TABLET | ORAL | 0 refills | Status: AC
  Filled 2022-03-24: qty 30, 15d supply, fill #0

## 2022-03-24 MED ORDER — ROSUVASTATIN CALCIUM 10 MG PO TABS
10.0000 mg | ORAL_TABLET | Freq: Every day | ORAL | 3 refills | Status: DC
  Filled 2022-03-24: qty 90, 90d supply, fill #0
  Filled 2022-08-01: qty 90, 90d supply, fill #1
  Filled 2022-11-28: qty 90, 90d supply, fill #2

## 2022-03-24 MED ORDER — ALLOPURINOL 300 MG PO TABS
300.0000 mg | ORAL_TABLET | Freq: Every day | ORAL | 1 refills | Status: DC
  Filled 2022-03-24: qty 90, 90d supply, fill #0
  Filled 2022-08-25: qty 90, 90d supply, fill #1

## 2022-03-24 MED ORDER — LEVOTHYROXINE SODIUM 200 MCG PO TABS
ORAL_TABLET | ORAL | 1 refills | Status: DC
  Filled 2022-03-24: qty 96, fill #0
  Filled 2022-05-19: qty 96, 90d supply, fill #0
  Filled 2022-08-25: qty 96, 90d supply, fill #1

## 2022-03-24 MED ORDER — AMLODIPINE BESYLATE 5 MG PO TABS
5.0000 mg | ORAL_TABLET | Freq: Every day | ORAL | 1 refills | Status: DC
  Filled 2022-03-24 – 2022-09-09 (×3): qty 90, 90d supply, fill #0

## 2022-03-24 MED ORDER — POTASSIUM CHLORIDE CRYS ER 20 MEQ PO TBCR
20.0000 meq | EXTENDED_RELEASE_TABLET | Freq: Two times a day (BID) | ORAL | 1 refills | Status: DC
  Filled 2022-03-24 – 2022-06-23 (×3): qty 180, 90d supply, fill #0
  Filled 2022-09-24: qty 180, 90d supply, fill #1

## 2022-03-24 MED ORDER — LISINOPRIL 2.5 MG PO TABS
2.5000 mg | ORAL_TABLET | Freq: Every morning | ORAL | 1 refills | Status: DC
  Filled 2022-03-24 – 2022-05-19 (×3): qty 90, 90d supply, fill #0
  Filled 2022-08-25: qty 90, 90d supply, fill #1

## 2022-03-24 MED ORDER — TORSEMIDE 20 MG PO TABS
ORAL_TABLET | ORAL | 1 refills | Status: DC
  Filled 2022-03-24 – 2022-07-04 (×3): qty 540, 90d supply, fill #0
  Filled 2022-10-21: qty 540, 90d supply, fill #1

## 2022-03-24 MED ORDER — METOPROLOL SUCCINATE ER 50 MG PO TB24
50.0000 mg | ORAL_TABLET | Freq: Every day | ORAL | 1 refills | Status: DC
  Filled 2022-03-24 – 2022-09-09 (×3): qty 90, 90d supply, fill #0

## 2022-03-24 NOTE — Patient Instructions (Signed)

## 2022-03-24 NOTE — Progress Notes (Signed)
Subjective:    Patient ID: Amy Hunter, female    DOB: 02-28-1960, 62 y.o.   MRN: [id number]  HPI Patient arrives today for HTN and diabetes follow up. The patient was seen today as part of a comprehensive diabetic check up. Patient has diabetes Patient relates good compliance with taking the medication. We discussed their diet and exercise activities  We also discussed the importance of notifying us if any excessively high glucoses or low sugars.    Patient for blood pressure check up.  The patient does have hypertension.   Patient relates dietary measures try to minimize salt The importance of healthy diet and activity were discussed Patient relates compliance  Patient here for follow-up regarding cholesterol.    Patient relates taking medication on a regular basis Denies problems with medication Importance of dietary measures discussed Regular lab work regarding lipid and liver was checked and if needing additional labs was appropriately ordered  Patient has gout issues has had some recent flareups. We reviewed overall the labs in detail today  Results for orders placed or performed in visit on 03/20/22  Hemoglobin A1c  Result Value Ref Range   Hgb A1c MFr Bld 6.9 (H) 4.8 - 5.6 %   Est. average glucose Bld gHb Est-mCnc 151 mg/dL  Basic metabolic panel  Result Value Ref Range   Glucose 143 (H) 70 - 99 mg/dL   BUN 15 8 - 27 mg/dL   Creatinine, Ser 0.60 0.57 - 1.00 mg/dL   eGFR 101 >59 mL/min/1.73   BUN/Creatinine Ratio 25 12 - 28   Sodium 142 134 - 144 mmol/L   Potassium 4.3 3.5 - 5.2 mmol/L   Chloride 105 96 - 106 mmol/L   CO2 23 20 - 29 mmol/L   Calcium 9.1 8.7 - 10.3 mg/dL  Lipid panel  Result Value Ref Range   Cholesterol, Total 168 100 - 199 mg/dL   Triglycerides 103 0 - 149 mg/dL   HDL 40 >39 mg/dL   VLDL Cholesterol Cal 19 5 - 40 mg/dL   LDL Chol Calc (NIH) 109 (H) 0 - 99 mg/dL   Chol/HDL Ratio 4.2 0.0 - 4.4 ratio  Hepatic function panel  Result  Value Ref Range   Total Protein 6.7 6.0 - 8.5 g/dL   Albumin 4.2 3.9 - 4.9 g/dL   Bilirubin Total 0.2 0.0 - 1.2 mg/dL   Bilirubin, Direct 0.12 0.00 - 0.40 mg/dL   Alkaline Phosphatase 168 (H) 44 - 121 IU/L   AST 44 (H) 0 - 40 IU/L   ALT 62 (H) 0 - 32 IU/L  Microalbumin/Creatinine Ratio, Urine  Result Value Ref Range   Creatinine, Urine 101.2 Not Estab. mg/dL   Microalbumin, Urine 11.1 Not Estab. ug/mL   Microalb/Creat Ratio 11 0 - 29 mg/g creat  Uric Acid  Result Value Ref Range   Uric Acid 7.2 3.0 - 7.2 mg/dL     Review of Systems     Objective:   Physical Exam  General-in no acute distress Eyes-no discharge Lungs-respiratory rate normal, CTA CV-no murmurs,RRR Extremities skin warm dry no edema Neuro grossly normal Behavior normal, alert   Cologuard ordered      Assessment & Plan:  1. Other hyperlipidemia Statins start Crestor every day goal to get LDL below 70  2. Type 2 diabetes mellitus without complication, without long-term current use of insulin (HCC) Diabetes under good control continue current measures healthy diet regular activity continue medicine bump up dose of Trulicity to try  to get A1c further down as well as bring her weight down - metFORMIN (GLUCOPHAGE) 500 MG tablet; Take 0.5 tablets (250 mg total) by mouth 2 (two) times daily.  Dispense: 90 tablet; Refill: 1  3. Hyperlipidemia associated with type 2 diabetes mellitus ([place]) Crestor as per above we will repeat lipid liver in about 8 weeks to monitor how her liver enzymes are doing on this medicine patient has fatty liver  4. Essential hypertension Blood pressure decent control continue current measures - lisinopril (ZESTRIL) 2.5 MG tablet; Take 1 tablet (2.5 mg total) by mouth every morning.  Dispense: 90 tablet; Refill: 1 - metoprolol succinate (TOPROL-XL) 50 MG 24 hr tablet; Take 1 tablet (50 mg total) by mouth daily. Take with or immediately following a meal  Dispense: 90 tablet; Refill: 1 -  potassium chloride SA (KLOR-CON M) 20 MEQ tablet; Take 1 tablet (20 mEq total) by mouth 2 (two) times daily.  Dispense: 180 tablet; Refill: 1 - torsemide (DEMADEX) 20 MG tablet; Take 3 tablets (60 mg total) by mouth every morning AND 3 tablets (60 mg total) every evening.  Dispense: 540 tablet; Refill: 1  5. Hypothyroidism, unspecified type Continue thyroid medicine - levothyroxine (SYNTHROID) 200 MCG tablet; Take 1 tablet by mouth daily except on Mondays take 1 & 1/2 tablets  Dispense: 96 tablet; Refill: 1  6. Gout, unspecified cause, unspecified chronicity, unspecified site Continue gout medicine bump up the dose to get uric acid lower - allopurinol (ZYLOPRIM) 300 MG tablet; Take 1 tablet (300 mg total) by mouth daily.  Dispense: 90 tablet; Refill: 1 +

## 2022-03-27 ENCOUNTER — Other Ambulatory Visit (HOSPITAL_COMMUNITY): Payer: Self-pay

## 2022-04-02 ENCOUNTER — Other Ambulatory Visit: Payer: Self-pay

## 2022-04-07 ENCOUNTER — Other Ambulatory Visit (HOSPITAL_COMMUNITY): Payer: Self-pay | Admitting: Family Medicine

## 2022-04-07 DIAGNOSIS — Z1231 Encounter for screening mammogram for malignant neoplasm of breast: Secondary | ICD-10-CM

## 2022-04-17 ENCOUNTER — Encounter (HOSPITAL_COMMUNITY): Payer: Self-pay

## 2022-04-17 ENCOUNTER — Ambulatory Visit (HOSPITAL_COMMUNITY)
Admission: RE | Admit: 2022-04-17 | Discharge: 2022-04-17 | Disposition: A | Discharge status: Home / Self Care | Payer: Commercial Managed Care - PPO | Source: Ambulatory Visit | Attending: Family Medicine | Admitting: Family Medicine

## 2022-04-17 DIAGNOSIS — Z1231 Encounter for screening mammogram for malignant neoplasm of breast: Secondary | ICD-10-CM | POA: Insufficient documentation

## 2022-04-30 DIAGNOSIS — Z1211 Encounter for screening for malignant neoplasm of colon: Secondary | ICD-10-CM | POA: Diagnosis not present

## 2022-05-06 LAB — COLOGUARD: COLOGUARD: NEGATIVE

## 2022-05-07 ENCOUNTER — Telehealth: Payer: Self-pay | Admitting: Family Medicine

## 2022-05-07 ENCOUNTER — Encounter: Payer: Self-pay | Admitting: Family Medicine

## 2022-05-07 NOTE — Telephone Encounter (Signed)
Will be sending patient MyChart message regarding repeating lipid liver this spring and doing her follow-up in August

## 2022-05-19 ENCOUNTER — Other Ambulatory Visit (HOSPITAL_COMMUNITY): Payer: Self-pay

## 2022-05-19 ENCOUNTER — Other Ambulatory Visit: Payer: Self-pay

## 2022-05-20 ENCOUNTER — Encounter: Payer: Self-pay | Admitting: Family Medicine

## 2022-06-03 ENCOUNTER — Other Ambulatory Visit (HOSPITAL_COMMUNITY): Payer: Self-pay

## 2022-06-03 ENCOUNTER — Other Ambulatory Visit: Payer: Self-pay | Admitting: Family Medicine

## 2022-06-05 ENCOUNTER — Other Ambulatory Visit: Payer: Self-pay

## 2022-06-05 MED ORDER — COLCHICINE 0.6 MG PO TABS
0.6000 mg | ORAL_TABLET | Freq: Two times a day (BID) | ORAL | 0 refills | Status: DC | PRN
  Filled 2022-06-05: qty 60, 30d supply, fill #0

## 2022-06-06 ENCOUNTER — Other Ambulatory Visit (HOSPITAL_COMMUNITY): Payer: Self-pay

## 2022-06-06 ENCOUNTER — Other Ambulatory Visit: Payer: Self-pay | Admitting: *Deleted

## 2022-06-06 ENCOUNTER — Other Ambulatory Visit: Payer: Self-pay

## 2022-06-06 MED ORDER — COLCHICINE 0.6 MG PO TABS
0.6000 mg | ORAL_TABLET | Freq: Two times a day (BID) | ORAL | 0 refills | Status: DC | PRN
  Filled 2022-06-06 – 2022-08-01 (×2): qty 60, 30d supply, fill #0

## 2022-06-06 MED ORDER — COLCHICINE 0.6 MG PO TABS
0.6000 mg | ORAL_TABLET | Freq: Two times a day (BID) | ORAL | 0 refills | Status: DC | PRN
  Filled 2022-06-06: qty 60, 30d supply, fill #0

## 2022-06-23 ENCOUNTER — Other Ambulatory Visit: Payer: Self-pay | Admitting: Family Medicine

## 2022-06-23 ENCOUNTER — Other Ambulatory Visit (HOSPITAL_COMMUNITY): Payer: Self-pay

## 2022-06-23 DIAGNOSIS — M109 Gout, unspecified: Secondary | ICD-10-CM

## 2022-06-26 ENCOUNTER — Other Ambulatory Visit (HOSPITAL_COMMUNITY): Payer: Self-pay

## 2022-06-26 MED ORDER — NAPROXEN 500 MG PO TABS
500.0000 mg | ORAL_TABLET | Freq: Two times a day (BID) | ORAL | 0 refills | Status: DC
  Filled 2022-06-26: qty 180, 90d supply, fill #0

## 2022-07-02 ENCOUNTER — Other Ambulatory Visit (HOSPITAL_COMMUNITY): Payer: Self-pay

## 2022-07-04 ENCOUNTER — Other Ambulatory Visit (HOSPITAL_COMMUNITY): Payer: Self-pay

## 2022-07-05 ENCOUNTER — Other Ambulatory Visit (HOSPITAL_COMMUNITY): Payer: Self-pay

## 2022-08-01 ENCOUNTER — Other Ambulatory Visit (HOSPITAL_COMMUNITY): Payer: Self-pay

## 2022-08-01 ENCOUNTER — Other Ambulatory Visit: Payer: Self-pay | Admitting: Family Medicine

## 2022-08-01 DIAGNOSIS — E119 Type 2 diabetes mellitus without complications: Secondary | ICD-10-CM

## 2022-08-02 ENCOUNTER — Other Ambulatory Visit (HOSPITAL_COMMUNITY): Payer: Self-pay

## 2022-08-04 ENCOUNTER — Other Ambulatory Visit: Payer: Self-pay

## 2022-08-05 ENCOUNTER — Other Ambulatory Visit (HOSPITAL_COMMUNITY): Payer: Self-pay

## 2022-08-12 ENCOUNTER — Other Ambulatory Visit (HOSPITAL_COMMUNITY): Payer: Self-pay

## 2022-08-12 ENCOUNTER — Other Ambulatory Visit: Payer: Self-pay | Admitting: Family Medicine

## 2022-08-12 DIAGNOSIS — E119 Type 2 diabetes mellitus without complications: Secondary | ICD-10-CM

## 2022-08-14 ENCOUNTER — Other Ambulatory Visit (HOSPITAL_COMMUNITY): Payer: Self-pay

## 2022-08-14 MED ORDER — TRULICITY 3 MG/0.5ML ~~LOC~~ SOAJ
3.0000 mg | SUBCUTANEOUS | 0 refills | Status: DC
  Filled 2022-08-14 (×2): qty 2, 28d supply, fill #0

## 2022-08-25 ENCOUNTER — Other Ambulatory Visit: Payer: Self-pay

## 2022-08-25 ENCOUNTER — Other Ambulatory Visit (HOSPITAL_COMMUNITY): Payer: Self-pay

## 2022-09-09 ENCOUNTER — Other Ambulatory Visit: Payer: Self-pay

## 2022-09-22 ENCOUNTER — Ambulatory Visit: Payer: Commercial Managed Care - PPO | Admitting: Family Medicine

## 2022-09-24 ENCOUNTER — Other Ambulatory Visit (HOSPITAL_COMMUNITY): Payer: Self-pay

## 2022-09-30 ENCOUNTER — Other Ambulatory Visit: Payer: Self-pay

## 2022-09-30 ENCOUNTER — Other Ambulatory Visit (HOSPITAL_COMMUNITY): Payer: Self-pay

## 2022-09-30 ENCOUNTER — Other Ambulatory Visit: Payer: Self-pay | Admitting: Family Medicine

## 2022-09-30 DIAGNOSIS — M109 Gout, unspecified: Secondary | ICD-10-CM

## 2022-10-01 ENCOUNTER — Other Ambulatory Visit (HOSPITAL_COMMUNITY): Payer: Self-pay

## 2022-10-01 MED ORDER — COLCHICINE 0.6 MG PO TABS
0.6000 mg | ORAL_TABLET | Freq: Two times a day (BID) | ORAL | 0 refills | Status: DC | PRN
  Filled 2022-10-01: qty 60, 30d supply, fill #0

## 2022-10-01 MED ORDER — NAPROXEN 500 MG PO TABS
500.0000 mg | ORAL_TABLET | Freq: Two times a day (BID) | ORAL | 0 refills | Status: DC
  Filled 2022-10-01: qty 180, 90d supply, fill #0

## 2022-10-21 ENCOUNTER — Other Ambulatory Visit: Payer: Self-pay | Admitting: Family Medicine

## 2022-10-21 ENCOUNTER — Other Ambulatory Visit (HOSPITAL_COMMUNITY): Payer: Self-pay

## 2022-10-21 MED FILL — Dulaglutide Soln Auto-injector 3 MG/0.5ML: SUBCUTANEOUS | 28 days supply | Qty: 2 | Fill #0 | Status: AC

## 2022-10-25 ENCOUNTER — Other Ambulatory Visit (HOSPITAL_COMMUNITY): Payer: Self-pay

## 2022-10-31 ENCOUNTER — Other Ambulatory Visit (HOSPITAL_COMMUNITY): Payer: Self-pay

## 2022-10-31 ENCOUNTER — Ambulatory Visit: Payer: Commercial Managed Care - PPO | Admitting: Nurse Practitioner

## 2022-10-31 ENCOUNTER — Encounter: Payer: Self-pay | Admitting: Nurse Practitioner

## 2022-10-31 VITALS — BP 110/69 | HR 72 | Temp 98.1°F | Ht 66.0 in | Wt 304.0 lb

## 2022-10-31 DIAGNOSIS — Z7985 Long-term (current) use of injectable non-insulin antidiabetic drugs: Secondary | ICD-10-CM

## 2022-10-31 DIAGNOSIS — Z130 Encounter for screening for diseases of the blood and blood-forming organs and certain disorders involving the immune mechanism: Secondary | ICD-10-CM | POA: Diagnosis not present

## 2022-10-31 DIAGNOSIS — E119 Type 2 diabetes mellitus without complications: Secondary | ICD-10-CM

## 2022-10-31 DIAGNOSIS — E039 Hypothyroidism, unspecified: Secondary | ICD-10-CM | POA: Diagnosis not present

## 2022-10-31 DIAGNOSIS — L209 Atopic dermatitis, unspecified: Secondary | ICD-10-CM

## 2022-10-31 DIAGNOSIS — K760 Fatty (change of) liver, not elsewhere classified: Secondary | ICD-10-CM

## 2022-10-31 DIAGNOSIS — M1A0790 Idiopathic chronic gout, unspecified ankle and foot, without tophus (tophi): Secondary | ICD-10-CM | POA: Diagnosis not present

## 2022-10-31 DIAGNOSIS — I10 Essential (primary) hypertension: Secondary | ICD-10-CM | POA: Diagnosis not present

## 2022-10-31 DIAGNOSIS — N898 Other specified noninflammatory disorders of vagina: Secondary | ICD-10-CM

## 2022-10-31 DIAGNOSIS — R748 Abnormal levels of other serum enzymes: Secondary | ICD-10-CM

## 2022-10-31 DIAGNOSIS — E7849 Other hyperlipidemia: Secondary | ICD-10-CM | POA: Diagnosis not present

## 2022-10-31 MED ORDER — CLOBETASOL PROPIONATE 0.05 % EX CREA: 1.0000 | TOPICAL_CREAM | Freq: Two times a day (BID) | CUTANEOUS | 0 refills | Status: DC

## 2022-10-31 MED ORDER — TRULICITY 4.5 MG/0.5ML ~~LOC~~ SOAJ
4.5000 mg | SUBCUTANEOUS | 2 refills | Status: DC
  Filled 2022-10-31 – 2022-12-16 (×2): qty 2, 28d supply, fill #0
  Filled 2023-01-09: qty 2, 28d supply, fill #1
  Filled 2023-02-09: qty 2, 28d supply, fill #2

## 2022-10-31 NOTE — Progress Notes (Unsigned)
Subjective:    Patient ID: Amy Hunter, female    DOB: 06/23/1960, 62 y.o.   MRN: [id number]  HPI Diabetes , 6 month follow up States her diet has improved.  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  Has not gotten her labs that were ordered back in February at previous visit.  Due for an eye exam.  States her gout has been stable. Currently on Trulicity 3 mg weekly.  Denies any nausea, vomiting or constipation.  Also c/o chronic itching on her back area.   Review of Systems  HENT:  Negative for sore throat and trouble swallowing.   Respiratory:  Negative for cough, chest tightness and shortness of breath.   Cardiovascular:  Negative for chest pain.  Gastrointestinal:  Negative for constipation, diarrhea, nausea and vomiting.  Skin:  Positive for rash.       Objective:   Physical Exam NAD.  Alert, oriented.  Thyroid nontender to palpation, no mass or goiter noted.  Lungs clear.  Heart regular rate rhythm.  Abdomen soft nondistended nontender.  Most of the mid back area is covered with a dry maculopapular rash with excoriation and lichenification.  No active signs of infection. Today's Vitals   10/31/22 1343  BP: 110/69  Pulse: 72  Temp: 98.1 F (36.7 C)  SpO2: 93%  Weight: (!) 304 lb (137.9 kg)  Height: 5\' 6"  (1.676 m)   Body mass index is 49.07 kg/m. Diabetic Foot Exam - Simple   Simple Foot Form Visual Inspection No deformities, no ulcerations, no other skin breakdown bilaterally: Yes Sensation Testing Intact to touch and monofilament testing bilaterally: Yes Pulse Check See comments: Yes Comments DP pulses present bilaterally; skin dry around the heels. Thickened nail right great toe.            Assessment & Plan:   Problem List Items Addressed This Visit       Cardiovascular and Mediastinum   Essential hypertension   Relevant Orders   Comprehensive metabolic panel   Lipid panel     Digestive   Fatty liver     Endocrine    Hypothyroidism   Relevant Orders   TSH   Type 2 diabetes mellitus without complication (HCC) - Primary   Relevant Medications   Dulaglutide (TRULICITY) 4.5 MG/0.5ML SOPN   Other Relevant Orders   Comprehensive metabolic panel   Hemoglobin A1c     Musculoskeletal and Integument   Atopic dermatitis   Relevant Medications   clobetasol cream (TEMOVATE) 0.05 %     Other   Elevated liver enzymes   Relevant Orders   Comprehensive metabolic panel   Gout   Hyperlipidemia   Relevant Orders   Lipid panel   Morbid obesity (HCC)   Relevant Medications   Dulaglutide (TRULICITY) 4.5 MG/0.5ML SOPN   Other Visit Diagnoses     Screening for deficiency anemia       Relevant Orders   CBC with Differential/Platelet      d     Meds ordered this encounter  Medications   Dulaglutide (TRULICITY) 4.5 MG/0.5ML SOPN    Sig: Inject 4.5 mg as directed once a week.    Dispense:  2 mL    Refill:  2    Order Specific Question:   Supervising Provider    Answer:   Lilyan Punt A [[id number]]   clobetasol cream (TEMOVATE) 0.05 %    Sig: Apply 1 Application topically 2 (two) times daily. To dry patches  of skin    Dispense:  30 g    Refill:  0    Order Specific Question:   Supervising Provider    Answer:   Lilyan Punt A [[id number]]   Increase Trulicity to 4.5 mg weekly.  Contact office if any adverse effects.  Depending on response, consider switching to [facility] if covered by insurance. Recommend unscented Dove soap for bathing.  Regular use of CeraVe for dry skin. Clobetasol as directed twice daily to rash.  Up to 2 weeks at a time. Recommend eye exam. Discussed importance of healthy diet and continued weight loss efforts. Has had her flu vaccine. Labs ordered. Return in about 3 months (around 01/30/2023).

## 2022-10-31 NOTE — Patient Instructions (Addendum)
[facility] preferred drug list Dove soap unscented Cerve lotion

## 2022-11-02 ENCOUNTER — Encounter: Payer: Self-pay | Admitting: Nurse Practitioner

## 2022-11-03 ENCOUNTER — Telehealth: Payer: Self-pay

## 2022-11-03 NOTE — Telephone Encounter (Signed)
[facility] pharmacy called and stated insurance is refusing to fill clobetasol cream (TEMOVATE) 0.05 % sent in on 10/31/2022 without knowing the location of rash and how much to apply.

## 2022-11-06 ENCOUNTER — Other Ambulatory Visit: Payer: Self-pay | Admitting: Nurse Practitioner

## 2022-11-06 DIAGNOSIS — L209 Atopic dermatitis, unspecified: Secondary | ICD-10-CM

## 2022-11-17 ENCOUNTER — Ambulatory Visit: Payer: Commercial Managed Care - PPO | Admitting: Nurse Practitioner

## 2022-11-17 VITALS — BP 136/76 | HR 85 | Temp 97.7°F | Ht 66.0 in | Wt 301.0 lb

## 2022-11-17 DIAGNOSIS — Z0279 Encounter for issue of other medical certificate: Secondary | ICD-10-CM

## 2022-11-17 DIAGNOSIS — I83893 Varicose veins of bilateral lower extremities with other complications: Secondary | ICD-10-CM

## 2022-11-17 DIAGNOSIS — E1142 Type 2 diabetes mellitus with diabetic polyneuropathy: Secondary | ICD-10-CM

## 2022-11-17 NOTE — Telephone Encounter (Signed)
See MyChart message

## 2022-11-17 NOTE — Progress Notes (Unsigned)
   Subjective:    Patient ID: Amy Hunter, female    DOB: 12/03/1960, 62 y.o.   MRN: [id number]  HPI Discuss FMLA for leg neuropathy  Currently working 4 days a week approximately 8-hour shifts which requires standing and walking.  Has had neuropathic symptoms in both legs for over a year.  Both legs become numb at times with stinging sensations.  The soles of her feet feel like they are "on fire".  Has been wearing compression stockings.  Has type 2 diabetes.  Has been watching her sugar.  Denies any claudication symptoms.  Minimal relief with naproxen.  Mainly relieved with rest.  Symptoms have developed to the point where it is difficult for her to do her job on certain days.  Requesting leave a few days a month when the symptoms are at their worst.  Denies any weakness or falls.  Symptoms are limited to both legs.  Mild swelling mainly noted at the end of the day when she is on her feet.  Review of Systems  Respiratory:  Negative for cough, chest tightness and shortness of breath.   Cardiovascular:  Positive for leg swelling. Negative for chest pain.  Musculoskeletal:        Bilateral leg numbness and stinging; feels like both feet are on fire       Objective:   Physical Exam NAD.  Alert, oriented.  Lungs clear.  Heart regular rate rhythm.  Sensation grossly intact to both lower legs.  Gait slow but steady.  Strong DP pulses in both feet with warm toes and normal capillary refill.  Varicose veins noted in both legs.  No erythema or warmth noted.  Trace pitting edema. Last hemoglobin A1c dated 03/20/2022 was 6.9%.  Current labs pending. Today's Vitals   11/17/22 1324  BP: 136/76  Pulse: 85  Temp: 97.7 F (36.5 C)  SpO2: 95%  Weight: (!) 301 lb (136.5 kg)  Height: 5\' 6"  (1.676 m)   Body mass index is 48.58 kg/m.         Assessment & Plan:   Problem List Items Addressed This Visit       Cardiovascular and Mediastinum   Varicose veins of bilateral lower extremities with  other complications - Primary     Endocrine   Diabetic peripheral neuropathy associated with type 2 diabetes mellitus (HCC)   Continue to work on weight loss, monitor sugars and wear compression stockings. Reminded patient about completing labs. Will complete FMLA form requesting 3 days/month in case symptoms are severe enough to keep her from working. Routine follow-up here.

## 2022-11-18 ENCOUNTER — Telehealth: Payer: Self-pay

## 2022-11-18 NOTE — Telephone Encounter (Signed)
Patient dropped off document FMLA, to be filled out by provider. Patient requested to send it back via Call Patient to pick up within 7-days. Document is located in providers tray at front office.Please advise at Mobile [phone] (mobile)  Placed in [facility] in her office to complete

## 2022-11-20 ENCOUNTER — Encounter: Payer: Self-pay | Admitting: Nurse Practitioner

## 2022-11-20 NOTE — Telephone Encounter (Signed)
FMLA completed this am and given to front desk.

## 2022-11-26 ENCOUNTER — Encounter: Payer: Self-pay | Admitting: Family Medicine

## 2022-11-28 ENCOUNTER — Other Ambulatory Visit (HOSPITAL_COMMUNITY): Payer: Self-pay

## 2022-11-28 ENCOUNTER — Other Ambulatory Visit: Payer: Self-pay | Admitting: Family Medicine

## 2022-11-28 DIAGNOSIS — M109 Gout, unspecified: Secondary | ICD-10-CM

## 2022-11-28 DIAGNOSIS — I10 Essential (primary) hypertension: Secondary | ICD-10-CM

## 2022-11-28 DIAGNOSIS — E039 Hypothyroidism, unspecified: Secondary | ICD-10-CM

## 2022-12-01 ENCOUNTER — Other Ambulatory Visit: Payer: Self-pay

## 2022-12-02 ENCOUNTER — Other Ambulatory Visit (HOSPITAL_COMMUNITY): Payer: Self-pay

## 2022-12-02 MED ORDER — POTASSIUM CHLORIDE CRYS ER 20 MEQ PO TBCR
20.0000 meq | EXTENDED_RELEASE_TABLET | Freq: Two times a day (BID) | ORAL | 1 refills | Status: DC
  Filled 2022-12-02 – 2023-01-04 (×2): qty 180, 90d supply, fill #0
  Filled 2023-01-13 – 2023-05-26 (×2): qty 180, 90d supply, fill #1

## 2022-12-02 MED ORDER — LEVOTHYROXINE SODIUM 200 MCG PO TABS
ORAL_TABLET | ORAL | 1 refills | Status: DC
  Filled 2022-12-02: qty 96, 90d supply, fill #0
  Filled 2023-04-01: qty 96, 90d supply, fill #1

## 2022-12-02 MED ORDER — LISINOPRIL 2.5 MG PO TABS
2.5000 mg | ORAL_TABLET | Freq: Every morning | ORAL | 1 refills | Status: DC
  Filled 2022-12-02: qty 90, 90d supply, fill #0
  Filled 2023-04-01: qty 90, 90d supply, fill #1

## 2022-12-02 MED ORDER — ALLOPURINOL 300 MG PO TABS
300.0000 mg | ORAL_TABLET | Freq: Every day | ORAL | 1 refills | Status: DC
  Filled 2022-12-02: qty 90, 90d supply, fill #0
  Filled 2023-04-01: qty 90, 90d supply, fill #1

## 2022-12-02 MED ORDER — COLCHICINE 0.6 MG PO TABS
0.6000 mg | ORAL_TABLET | Freq: Two times a day (BID) | ORAL | 0 refills | Status: DC | PRN
  Filled 2022-12-02: qty 60, 30d supply, fill #0

## 2022-12-03 ENCOUNTER — Other Ambulatory Visit: Payer: Self-pay

## 2022-12-06 ENCOUNTER — Other Ambulatory Visit (HOSPITAL_COMMUNITY): Payer: Self-pay

## 2022-12-06 MED ORDER — NAPROXEN 500 MG PO TABS
500.0000 mg | ORAL_TABLET | Freq: Two times a day (BID) | ORAL | 0 refills | Status: DC
  Filled 2022-12-06 – 2023-02-09 (×3): qty 180, 90d supply, fill #0

## 2022-12-08 ENCOUNTER — Other Ambulatory Visit (HOSPITAL_COMMUNITY): Payer: Self-pay

## 2022-12-09 ENCOUNTER — Other Ambulatory Visit: Payer: Self-pay

## 2022-12-16 ENCOUNTER — Other Ambulatory Visit (HOSPITAL_COMMUNITY): Payer: Self-pay

## 2022-12-16 ENCOUNTER — Other Ambulatory Visit: Payer: Self-pay | Admitting: Family Medicine

## 2022-12-16 DIAGNOSIS — I10 Essential (primary) hypertension: Secondary | ICD-10-CM

## 2022-12-17 ENCOUNTER — Other Ambulatory Visit: Payer: Self-pay

## 2022-12-17 ENCOUNTER — Other Ambulatory Visit (HOSPITAL_COMMUNITY): Payer: Self-pay

## 2022-12-17 MED ORDER — AMLODIPINE BESYLATE 5 MG PO TABS
5.0000 mg | ORAL_TABLET | Freq: Every day | ORAL | 1 refills | Status: AC
  Filled 2022-12-17: qty 90, 90d supply, fill #0
  Filled 2023-01-13 – 2023-05-04 (×2): qty 90, 90d supply, fill #1

## 2022-12-17 MED ORDER — METOPROLOL SUCCINATE ER 50 MG PO TB24
50.0000 mg | ORAL_TABLET | Freq: Every day | ORAL | 1 refills | Status: DC
  Filled 2022-12-17: qty 90, 90d supply, fill #0
  Filled 2023-01-04 – 2023-04-01 (×2): qty 90, 90d supply, fill #1

## 2022-12-23 ENCOUNTER — Other Ambulatory Visit (HOSPITAL_COMMUNITY): Payer: Self-pay

## 2023-01-05 ENCOUNTER — Other Ambulatory Visit (HOSPITAL_COMMUNITY): Payer: Self-pay

## 2023-01-05 ENCOUNTER — Other Ambulatory Visit: Payer: Self-pay

## 2023-01-10 ENCOUNTER — Other Ambulatory Visit (HOSPITAL_COMMUNITY): Payer: Self-pay

## 2023-01-13 ENCOUNTER — Other Ambulatory Visit: Payer: Self-pay | Admitting: Family Medicine

## 2023-01-13 DIAGNOSIS — E119 Type 2 diabetes mellitus without complications: Secondary | ICD-10-CM

## 2023-01-14 ENCOUNTER — Other Ambulatory Visit (HOSPITAL_COMMUNITY): Payer: Self-pay

## 2023-01-14 ENCOUNTER — Other Ambulatory Visit: Payer: Self-pay

## 2023-01-14 MED ORDER — METFORMIN HCL 500 MG PO TABS
250.0000 mg | ORAL_TABLET | Freq: Two times a day (BID) | ORAL | 1 refills | Status: DC
  Filled 2023-01-14: qty 90, 90d supply, fill #0
  Filled 2023-05-04: qty 90, 90d supply, fill #1

## 2023-02-09 ENCOUNTER — Other Ambulatory Visit: Payer: Self-pay | Admitting: Family Medicine

## 2023-02-09 ENCOUNTER — Other Ambulatory Visit: Payer: Self-pay

## 2023-02-09 ENCOUNTER — Other Ambulatory Visit (HOSPITAL_COMMUNITY): Payer: Self-pay

## 2023-02-09 MED ORDER — COLCHICINE 0.6 MG PO TABS
0.6000 mg | ORAL_TABLET | Freq: Two times a day (BID) | ORAL | 0 refills | Status: DC | PRN
  Filled 2023-02-09: qty 60, 30d supply, fill #0

## 2023-02-18 ENCOUNTER — Other Ambulatory Visit: Payer: Self-pay | Admitting: Family Medicine

## 2023-02-18 DIAGNOSIS — I10 Essential (primary) hypertension: Secondary | ICD-10-CM

## 2023-02-19 ENCOUNTER — Other Ambulatory Visit (HOSPITAL_COMMUNITY): Payer: Self-pay

## 2023-02-19 ENCOUNTER — Other Ambulatory Visit: Payer: Self-pay

## 2023-02-19 DIAGNOSIS — I10 Essential (primary) hypertension: Secondary | ICD-10-CM

## 2023-02-19 MED ORDER — TORSEMIDE 20 MG PO TABS
ORAL_TABLET | ORAL | 1 refills | Status: DC
  Filled 2023-02-19 (×2): qty 540, 90d supply, fill #0
  Filled 2023-05-26: qty 540, 90d supply, fill #1

## 2023-03-02 ENCOUNTER — Encounter: Payer: Self-pay | Admitting: Family Medicine

## 2023-03-02 NOTE — Telephone Encounter (Signed)
It is possible that this could be a nerve impingement causing this issue it is also possible that this could be another issue even localized neuropathy I would recommend an office visit with myself or Toni Amend in the near future thank you

## 2023-03-09 ENCOUNTER — Other Ambulatory Visit (HOSPITAL_COMMUNITY): Payer: Self-pay

## 2023-03-09 ENCOUNTER — Ambulatory Visit: Payer: Commercial Managed Care - PPO | Admitting: Physician Assistant

## 2023-03-09 ENCOUNTER — Encounter: Payer: Self-pay | Admitting: Physician Assistant

## 2023-03-09 VITALS — BP 115/76 | HR 88 | Temp 97.8°F | Ht 66.0 in | Wt 287.2 lb

## 2023-03-09 DIAGNOSIS — E1142 Type 2 diabetes mellitus with diabetic polyneuropathy: Secondary | ICD-10-CM | POA: Diagnosis not present

## 2023-03-09 DIAGNOSIS — I83893 Varicose veins of bilateral lower extremities with other complications: Secondary | ICD-10-CM

## 2023-03-09 MED ORDER — GABAPENTIN 100 MG PO CAPS
100.0000 mg | ORAL_CAPSULE | Freq: Three times a day (TID) | ORAL | 3 refills | Status: DC
  Filled 2023-03-09 – 2023-03-10 (×3): qty 90, 30d supply, fill #0
  Filled 2023-05-26: qty 90, 30d supply, fill #1

## 2023-03-09 NOTE — Assessment & Plan Note (Addendum)
Patient appears stable. No lower extremity edema, no redness, no warmth, no area of localized tenderness or induration. No weakness or sensroy deficits. Symptoms most consistent with nerve impingement or peripheral neuropathy related to diabetes. Gabapentin 100mg  BID. Patient instructed to start with 1 capsule at bed time and slowly increase to 300mg  daily. Will follow up in about 1 month for re-evaluation and medication increase as needed. Patient advised to continue with Naproxen for pain relief and inflammation.

## 2023-03-09 NOTE — Progress Notes (Cosign Needed)
Acute Office Visit  Subjective:     Patient ID: Amy Hunter, female    DOB: 12/19/1960, 63 y.o.   MRN: [id number]   HPI Patient is in today for lower extremity numbness and burning. Patient with history of peripheral neuropathy related to diabetes. Today patient describes numbness in her left outer thigh and burning/heaviness in her lower right leg. Patient states symptoms have worsened from baseline over the last week or so. She states she has been taking Naproxen for symptoms. Patient denies falls, weakness, or numbness/tingling in her feet. She denies edema, redness, or warmth in her lower legs.   Review of Systems  Constitutional:  Negative for chills, fever and weight loss.  Respiratory:  Negative for cough and shortness of breath.   Cardiovascular:  Negative for chest pain and palpitations.  Musculoskeletal:  Negative for falls and myalgias.  Neurological:  Positive for tingling.        Objective:     BP 115/76   Pulse 88   Temp 97.8 F (36.6 C)   Ht 5\' 6"  (1.676 m)   Wt 287 lb 3.2 oz (130.3 kg)   SpO2 95%   BMI 46.36 kg/m   Physical Exam Constitutional:      Appearance: Normal appearance. She is obese.  HENT:     Head: Normocephalic.     Mouth/Throat:     Mouth: Mucous membranes are moist.     Pharynx: Oropharynx is clear.  Eyes:     Extraocular Movements: Extraocular movements intact.     Conjunctiva/sclera: Conjunctivae normal.  Cardiovascular:     Rate and Rhythm: Normal rate and regular rhythm.     Heart sounds: No murmur heard.    No gallop.  Pulmonary:     Effort: Pulmonary effort is normal.     Breath sounds: No wheezing, rhonchi or rales.  Musculoskeletal:     Right lower leg: No tenderness. No edema.     Left lower leg: No tenderness. No edema.     Right ankle: Normal pulse.     Left ankle: Normal pulse.  Skin:    General: Skin is warm and dry.     Findings: No erythema.  Neurological:     General: No focal deficit present.      Mental Status: She is alert and oriented to person, place, and time.     Sensory: No sensory deficit.     Motor: No weakness.     Gait: Gait normal.  Psychiatric:        Mood and Affect: Mood normal.        Behavior: Behavior normal.     No results found for any visits on 03/09/23.      Assessment & Plan:  Diabetic peripheral neuropathy associated with type 2 diabetes mellitus (HCC) Assessment & Plan: Patient appears stable. No lower extremity edema, no redness, no warmth, no area of localized tenderness or induration. No weakness or sensroy deficits. Symptoms most consistent with nerve impingement or peripheral neuropathy related to diabetes. Gabapentin 100mg  BID. Patient instructed to start with 1 capsule at bed time and slowly increase to 300mg  daily. Will follow up in about 1 month for re-evaluation and medication increase as needed. Patient advised to continue with Naproxen for pain relief and inflammation.   Orders: -     Gabapentin; Take 1 capsule (100 mg total) by mouth 3 (three) times daily.  Dispense: 90 capsule; Refill: 3  Varicose veins of bilateral lower extremities with other  complications Assessment & Plan: Stable. Patient reports wearing compression stocking to work. Symptoms of burning and heaviness in right lower leg likely attributed to varicose veins as well as peripheral neuropathy. Started on gabapentin today.      Return in about 5 weeks (around 04/13/2023) for neuropathy f/u.  Toni Amend Saranya Harlin, PA-C

## 2023-03-09 NOTE — Assessment & Plan Note (Signed)
Stable. Patient reports wearing compression stocking to work. Symptoms of burning and heaviness in right lower leg likely attributed to varicose veins as well as peripheral neuropathy. Started on gabapentin today.

## 2023-03-10 ENCOUNTER — Other Ambulatory Visit (HOSPITAL_COMMUNITY): Payer: Self-pay

## 2023-04-01 ENCOUNTER — Other Ambulatory Visit (HOSPITAL_COMMUNITY): Payer: Self-pay

## 2023-04-01 ENCOUNTER — Other Ambulatory Visit: Payer: Self-pay | Admitting: Family Medicine

## 2023-04-01 ENCOUNTER — Other Ambulatory Visit: Payer: Self-pay | Admitting: Nurse Practitioner

## 2023-04-01 MED ORDER — TRULICITY 4.5 MG/0.5ML ~~LOC~~ SOAJ
4.5000 mg | SUBCUTANEOUS | 2 refills | Status: DC
  Filled 2023-04-01: qty 2, 28d supply, fill #0
  Filled 2023-04-25: qty 2, 28d supply, fill #1
  Filled 2023-05-26: qty 2, 28d supply, fill #2

## 2023-04-02 ENCOUNTER — Other Ambulatory Visit (HOSPITAL_COMMUNITY): Payer: Self-pay

## 2023-04-02 MED ORDER — ROSUVASTATIN CALCIUM 10 MG PO TABS
10.0000 mg | ORAL_TABLET | Freq: Every day | ORAL | 3 refills | Status: AC
  Filled 2023-04-02: qty 90, 90d supply, fill #0

## 2023-04-13 ENCOUNTER — Ambulatory Visit: Payer: Commercial Managed Care - PPO | Admitting: Physician Assistant

## 2023-04-25 ENCOUNTER — Other Ambulatory Visit: Payer: Self-pay | Admitting: Family Medicine

## 2023-04-25 ENCOUNTER — Other Ambulatory Visit (HOSPITAL_COMMUNITY): Payer: Self-pay

## 2023-04-27 ENCOUNTER — Other Ambulatory Visit (HOSPITAL_COMMUNITY): Payer: Self-pay

## 2023-04-27 ENCOUNTER — Other Ambulatory Visit: Payer: Self-pay

## 2023-04-27 MED ORDER — COLCHICINE 0.6 MG PO TABS
0.6000 mg | ORAL_TABLET | Freq: Two times a day (BID) | ORAL | 0 refills | Status: DC | PRN
  Filled 2023-04-27: qty 60, 30d supply, fill #0

## 2023-05-04 ENCOUNTER — Other Ambulatory Visit (HOSPITAL_COMMUNITY): Payer: Self-pay

## 2023-05-05 ENCOUNTER — Other Ambulatory Visit (HOSPITAL_COMMUNITY): Payer: Self-pay

## 2023-05-18 ENCOUNTER — Ambulatory Visit: Admitting: Physician Assistant

## 2023-05-18 ENCOUNTER — Encounter: Payer: Self-pay | Admitting: Family Medicine

## 2023-05-18 ENCOUNTER — Encounter: Payer: Self-pay | Admitting: Physician Assistant

## 2023-05-18 VITALS — BP 123/74 | HR 93 | Temp 98.4°F | Ht 66.0 in | Wt 280.0 lb

## 2023-05-18 DIAGNOSIS — J069 Acute upper respiratory infection, unspecified: Secondary | ICD-10-CM

## 2023-05-18 DIAGNOSIS — B9689 Other specified bacterial agents as the cause of diseases classified elsewhere: Secondary | ICD-10-CM

## 2023-05-18 MED ORDER — DOXYCYCLINE HYCLATE 100 MG PO TABS: 100.0000 mg | ORAL_TABLET | Freq: Two times a day (BID) | ORAL | 0 refills | Status: AC

## 2023-05-18 MED ORDER — PROMETHAZINE-DM 6.25-15 MG/5ML PO SYRP: 5.0000 mL | ORAL_SOLUTION | Freq: Four times a day (QID) | ORAL | 0 refills | Status: AC | PRN

## 2023-05-18 NOTE — Progress Notes (Signed)
   Acute Office Visit  Subjective:     Patient ID: Amy Hunter, female    DOB: Apr 28, 1960, 63 y.o.   MRN: [id number]   Patient presents today with cough and congestion. She reports symptoms began over 1 week ago after being outside in the wind and pollen. Associated symptoms include sinus pressure and fatigue. She denies chest pain, shortness of breath, wheezing, or sputum production. She denies sick contacts. She has been eating and drinking per usual. She reports trying a variety of OTC medication for symptom relief with no avail.      Review of Systems  Constitutional:  Positive for malaise/fatigue. Negative for chills and fever.  HENT:  Positive for congestion. Negative for sinus pain and sore throat.   Respiratory:  Positive for cough. Negative for sputum production, shortness of breath and wheezing.   Cardiovascular:  Negative for chest pain and palpitations.  Musculoskeletal:  Negative for myalgias.        Objective:     BP 123/74   Pulse 93   Temp 98.4 F (36.9 C)   Ht 5\' 6"  (1.676 m)   Wt 280 lb (127 kg)   SpO2 96%   BMI 45.19 kg/m   Physical Exam Vitals reviewed.  Constitutional:      General: She is not in acute distress.    Appearance: Normal appearance.  HENT:     Nose: Nose normal.     Mouth/Throat:     Mouth: Mucous membranes are moist.     Pharynx: Oropharynx is clear.  Eyes:     Extraocular Movements: Extraocular movements intact.     Conjunctiva/sclera: Conjunctivae normal.  Cardiovascular:     Rate and Rhythm: Normal rate and regular rhythm.     Heart sounds: No murmur heard. Pulmonary:     Effort: Pulmonary effort is normal.     Breath sounds: Normal breath sounds. No wheezing, rhonchi or rales.  Skin:    General: Skin is warm and dry.  Neurological:     General: No focal deficit present.     Mental Status: She is alert and oriented to person, place, and time.  Psychiatric:        Mood and Affect: Mood normal.        Behavior:  Behavior normal.     No results found for any visits on 05/18/23.      Assessment & Plan:  Bacterial URI -     Doxycycline Hyclate; Take 1 tablet (100 mg total) by mouth 2 (two) times daily for 7 days.  Dispense: 14 tablet; Refill: 0 -     Promethazine-DM; Take 5 mLs by mouth 4 (four) times daily as needed for cough.  Dispense: 118 mL; Refill: 0   Patient appears stable today. Benign exam, lungs clear to auscultation bilaterally, no indication for chest XR at this time. Discussed that this fits the picture of viral vs bacterial URI and that due to type and duration of symptoms and exam findings, we will treat as bacterial URI. No evidence of other bacterial infections including pneumonia, pharyngitis, sinusitis, or otitis media. Supportive care reviewed with patient. Tylenol or ibuprofen for pain as needed. May continue with OTC cold medications. Patient instructed to return to clinic if worsening shortness of breath, chest pain, hypoxia, or other concerns. Patient agreeable to plan.    Return if symptoms worsen or fail to improve.  Toni Amend Fuquan Wilson, PA-C

## 2023-05-18 NOTE — Telephone Encounter (Signed)
 Patient scheduled office visit today with Kempner PA

## 2023-05-26 ENCOUNTER — Other Ambulatory Visit: Payer: Self-pay

## 2023-05-26 ENCOUNTER — Other Ambulatory Visit (HOSPITAL_COMMUNITY): Payer: Self-pay

## 2023-05-26 ENCOUNTER — Other Ambulatory Visit: Payer: Self-pay | Admitting: Family Medicine

## 2023-05-26 DIAGNOSIS — M109 Gout, unspecified: Secondary | ICD-10-CM

## 2023-05-26 MED ORDER — NAPROXEN 500 MG PO TABS
500.0000 mg | ORAL_TABLET | Freq: Two times a day (BID) | ORAL | 0 refills | Status: AC
  Filled 2023-05-26 – 2023-07-23 (×2): qty 180, 90d supply, fill #0

## 2023-06-05 ENCOUNTER — Other Ambulatory Visit (HOSPITAL_COMMUNITY): Payer: Self-pay

## 2023-06-28 ENCOUNTER — Other Ambulatory Visit: Payer: Self-pay | Admitting: Family Medicine

## 2023-06-28 ENCOUNTER — Other Ambulatory Visit: Payer: Self-pay | Admitting: Nurse Practitioner

## 2023-06-30 ENCOUNTER — Other Ambulatory Visit: Payer: Self-pay

## 2023-06-30 ENCOUNTER — Other Ambulatory Visit (HOSPITAL_COMMUNITY): Payer: Self-pay

## 2023-06-30 MED ORDER — COLCHICINE 0.6 MG PO TABS
0.6000 mg | ORAL_TABLET | Freq: Two times a day (BID) | ORAL | 0 refills | Status: DC | PRN
  Filled 2023-06-30 – 2023-07-01 (×2): qty 60, 30d supply, fill #0

## 2023-06-30 MED ORDER — TRULICITY 4.5 MG/0.5ML ~~LOC~~ SOAJ
4.5000 mg | SUBCUTANEOUS | 2 refills | Status: DC
  Filled 2023-06-30 – 2023-07-01 (×2): qty 2, 28d supply, fill #0
  Filled 2023-07-23 – 2023-07-28 (×2): qty 2, 28d supply, fill #1
  Filled 2023-08-24: qty 2, 28d supply, fill #2

## 2023-07-01 ENCOUNTER — Other Ambulatory Visit (HOSPITAL_COMMUNITY): Payer: Self-pay

## 2023-07-06 ENCOUNTER — Other Ambulatory Visit (HOSPITAL_COMMUNITY): Payer: Self-pay

## 2023-07-09 ENCOUNTER — Ambulatory Visit: Admitting: Family Medicine

## 2023-07-09 ENCOUNTER — Encounter: Payer: Self-pay | Admitting: Family Medicine

## 2023-07-09 VITALS — BP 135/68 | HR 96 | Temp 97.4°F | Ht 60.0 in | Wt 265.0 lb

## 2023-07-09 DIAGNOSIS — I8393 Asymptomatic varicose veins of bilateral lower extremities: Secondary | ICD-10-CM | POA: Insufficient documentation

## 2023-07-09 DIAGNOSIS — I83813 Varicose veins of bilateral lower extremities with pain: Secondary | ICD-10-CM

## 2023-07-09 NOTE — Progress Notes (Signed)
 Subjective:  Patient ID: Amy Hunter, female    DOB: 01/25/1961  Age: 63 y.o. MRN: [id number]  CC:   Chief Complaint  Patient presents with   Leg Pain    Right leg, concern for varicosis veins, burns and painful    HPI:  63 year old female presents for evaluation of the above.  Patient has known varicose veins. She reports an area of pain to the right lower leg. Burning sensation at the site of varicose veins. No recent injury. No redness. No other complaints.   Patient Active Problem List   Diagnosis Date Noted   Varicose veins of both lower extremities 07/09/2023   Diabetic peripheral neuropathy associated with type 2 diabetes mellitus (HCC) 11/17/2022   Hyperlipidemia associated with type 2 diabetes mellitus (HCC) 07/03/2020   Atopic dermatitis 01/03/2020   High risk medication use 01/03/2020   Essential hypertension 01/03/2020   Fatty liver 11/22/2018   Gout 11/04/2018   Elevated liver enzymes 11/04/2018   Type 2 diabetes mellitus without complication (HCC) 08/17/2015   Hyperlipidemia 11/10/2012   Hypothyroidism 11/10/2012   Obstructive sleep apnea 05/03/2012   Dyspnea on exertion 03/30/2012   Morbid obesity (HCC) 03/30/2012   Venous insufficiency 09/16/2011    Social Hx   Social History   Socioeconomic History   Marital status: Married    Spouse name: Not on file   Number of children: Not on file   Years of education: Not on file   Highest education level: Not on file  Occupational History   Occupation: Multimedia programmer: [employer]  Tobacco Use   Smoking status: Former    Current packs/day: 0.00    Average packs/day: 0.5 packs/day for 15.0 years (7.5 ttl pk-yrs)    Types: Cigarettes    Start date: 02/04/1995    Quit date: 02/03/2010    Years since quitting: 13.4   Smokeless tobacco: Never   Tobacco comments:    pt reports she smoked "off and on" for the entire smoking years.   Vaping Use   Vaping status: Never Used  Substance and Sexual Activity    Alcohol use: No   Drug use: No   Sexual activity: Not Currently  Other Topics Concern   Not on file  Social History Narrative   Not on file   Social Drivers of Health   Financial Resource Strain: Not on file  Food Insecurity: Not on file  Transportation Needs: Not on file  Physical Activity: Not on file  Stress: Not on file  Social Connections: Not on file    Review of Systems Per HPI  Objective:  BP 135/68   Pulse 96   Temp (!) 97.4 F (36.3 C)   Ht 5' (1.524 m)   Wt 265 lb (120.2 kg)   SpO2 96%   BMI 51.75 kg/m      07/09/2023    2:58 PM 05/18/2023    3:54 PM 03/09/2023    9:43 AM  BP/Weight  Systolic BP 135 123 115  Diastolic BP 68 74 76  Wt. (Lbs) 265 280 287.2  BMI 51.75 kg/m2 45.19 kg/m2 46.36 kg/m2    Physical Exam Vitals and nursing note reviewed.  Constitutional:      General: She is not in acute distress.    Appearance: Normal appearance.  HENT:     Head: Normocephalic and atraumatic.  Pulmonary:     Effort: Pulmonary effort is normal. No respiratory distress.  Musculoskeletal:  Comments: Right lower legs with significant varicosities.  Neurological:     Mental Status: She is alert.  Psychiatric:        Mood and Affect: Mood normal.        Behavior: Behavior normal.     Lab Results  Component Value Date   WBC 7.9 02/22/2019   HGB 14.4 02/22/2019   HCT 42.9 02/22/2019   PLT 177 02/22/2019   GLUCOSE 143 (H) 03/20/2022   CHOL 168 03/20/2022   TRIG 103 03/20/2022   HDL 40 03/20/2022   LDLCALC 109 (H) 03/20/2022   ALT 62 (H) 03/20/2022   AST 44 (H) 03/20/2022   NA 142 03/20/2022   K 4.3 03/20/2022   CL 105 03/20/2022   CREATININE 0.60 03/20/2022   BUN 15 03/20/2022   CO2 23 03/20/2022   TSH 0.906 11/29/2020   INR 1.1 02/23/2008   HGBA1C 6.9 (H) 03/20/2022   MICROALBUR 0.50 06/14/2013     Assessment & Plan:  Varicose veins of both lower extremities with pain Assessment & Plan: Recommended compression and elevated. Also  discussed referral to vascular. Supportive care.     Follow-up:  Return if symptoms worsen or fail to improve.  Kathleen Papa DO [facility]

## 2023-07-09 NOTE — Assessment & Plan Note (Signed)
 Recommended compression and elevated. Also discussed referral to vascular. Supportive care.

## 2023-07-23 ENCOUNTER — Other Ambulatory Visit: Payer: Self-pay

## 2023-07-23 ENCOUNTER — Other Ambulatory Visit: Payer: Self-pay | Admitting: Family Medicine

## 2023-07-23 DIAGNOSIS — M109 Gout, unspecified: Secondary | ICD-10-CM

## 2023-07-23 DIAGNOSIS — I10 Essential (primary) hypertension: Secondary | ICD-10-CM

## 2023-07-23 DIAGNOSIS — E039 Hypothyroidism, unspecified: Secondary | ICD-10-CM

## 2023-07-23 DIAGNOSIS — E119 Type 2 diabetes mellitus without complications: Secondary | ICD-10-CM

## 2023-07-23 MED ORDER — LEVOTHYROXINE SODIUM 200 MCG PO TABS
ORAL_TABLET | ORAL | 1 refills | Status: AC
  Filled 2023-07-23: qty 96, 90d supply, fill #0
  Filled 2023-12-17: qty 96, 90d supply, fill #1

## 2023-07-23 MED ORDER — METFORMIN HCL 500 MG PO TABS
250.0000 mg | ORAL_TABLET | Freq: Two times a day (BID) | ORAL | 1 refills | Status: AC
  Filled 2023-07-23: qty 90, 90d supply, fill #0
  Filled 2023-12-17: qty 90, 90d supply, fill #1

## 2023-07-23 MED ORDER — ALLOPURINOL 300 MG PO TABS
300.0000 mg | ORAL_TABLET | Freq: Every day | ORAL | 1 refills | Status: AC
  Filled 2023-07-23: qty 90, 90d supply, fill #0

## 2023-07-23 MED ORDER — LISINOPRIL 2.5 MG PO TABS
2.5000 mg | ORAL_TABLET | Freq: Every morning | ORAL | 1 refills | Status: AC
  Filled 2023-07-23: qty 90, 90d supply, fill #0
  Filled 2023-12-17: qty 90, 90d supply, fill #1

## 2023-07-23 MED ORDER — METOPROLOL SUCCINATE ER 50 MG PO TB24
50.0000 mg | ORAL_TABLET | Freq: Every day | ORAL | 1 refills | Status: AC
  Filled 2023-07-23: qty 90, 90d supply, fill #0
  Filled 2023-11-20: qty 90, 90d supply, fill #1

## 2023-07-28 ENCOUNTER — Other Ambulatory Visit (HOSPITAL_COMMUNITY): Payer: Self-pay

## 2023-08-25 ENCOUNTER — Other Ambulatory Visit (HOSPITAL_COMMUNITY): Payer: Self-pay

## 2023-09-06 ENCOUNTER — Encounter: Payer: Self-pay | Admitting: Family Medicine

## 2023-09-07 ENCOUNTER — Other Ambulatory Visit: Payer: Self-pay | Admitting: Nurse Practitioner

## 2023-09-07 MED ORDER — DOXYCYCLINE HYCLATE 100 MG PO CAPS: 100.0000 mg | ORAL_CAPSULE | Freq: Two times a day (BID) | ORAL | 0 refills | Status: DC

## 2023-09-16 ENCOUNTER — Ambulatory Visit: Payer: Self-pay

## 2023-09-16 NOTE — Telephone Encounter (Signed)
 FYI Only or Action Required?: Action required by provider: request for appointment.  Patient was last seen in primary care on 07/09/2023 by Cook, Jayce G, DO.  Called Nurse Triage reporting Back Pain.  Symptoms began a week ago.  Interventions attempted: OTC medications: tylenol , ibuprofen, icyhot.  Symptoms are: unchanged.  Triage Disposition: See PCP When Office is Open (Within 3 Days)  Patient/caregiver understands and will follow disposition?: Yes, will follow disposition  Copied from CRM #[id number]. Topic: Clinical - Red Word Triage >> Sep 16, 2023  3:11 PM Mia F wrote: Red Word that prompted transfer to Nurse Triage: Pain from left side of the back and goes around to the stomach. Started Thursday of last week. It comes and goes. It can be awful or mild. Pain is to where she is waking up in the middle of the night and unable to get out of bed. It tends to worsen throughout the day Reason for Disposition  [1] MODERATE back pain (e.g., interferes with normal activities) AND [2] present > 3 days  Answer Assessment - Initial Assessment Questions 1. ONSET: When did the pain begin? (e.g., minutes, hours, days)     week 2. LOCATION: Where does it hurt? (upper, mid or lower back)     Lower L back pain 3. SEVERITY: How bad is the pain?  (e.g., Scale 1-10; mild, moderate, or severe)     5 4. PATTERN: Is the pain constant? (e.g., yes, no; constant, intermittent)      intermittent 5. RADIATION: Does the pain shoot into your legs or somewhere else?     Down into legs 6. CAUSE:  What do you think is causing the back pain?      Denies injury, never pain like this in back 7. BACK OVERUSE:  Any recent lifting of heavy objects, strenuous work or exercise?     denies 8. MEDICINES: What have you taken so far for the pain? (e.g., nothing, acetaminophen , NSAIDS)     Helps some 9. NEUROLOGIC SYMPTOMS: Do you have any weakness, numbness, or problems with bowel/bladder control?      denies 10. OTHER SYMPTOMS: Do you have any other symptoms? (e.g., fever, abdomen pain, burning with urination, blood in urine)       Some spots of blood in underwear, denies fever  Protocols used: Back Pain-A-AH

## 2023-09-17 ENCOUNTER — Ambulatory Visit
Admission: RE | Admit: 2023-09-17 | Discharge: 2023-09-17 | Disposition: A | Discharge status: Home / Self Care | Source: Ambulatory Visit | Attending: Family Medicine | Admitting: Family Medicine

## 2023-09-17 ENCOUNTER — Other Ambulatory Visit: Payer: Self-pay

## 2023-09-17 ENCOUNTER — Other Ambulatory Visit: Payer: Self-pay | Admitting: Family Medicine

## 2023-09-17 ENCOUNTER — Other Ambulatory Visit (HOSPITAL_COMMUNITY): Payer: Self-pay

## 2023-09-17 VITALS — BP 137/84 | HR 88 | Temp 97.7°F | Resp 20

## 2023-09-17 DIAGNOSIS — R109 Unspecified abdominal pain: Secondary | ICD-10-CM | POA: Diagnosis not present

## 2023-09-17 DIAGNOSIS — R310 Gross hematuria: Secondary | ICD-10-CM

## 2023-09-17 DIAGNOSIS — M62830 Muscle spasm of back: Secondary | ICD-10-CM | POA: Diagnosis not present

## 2023-09-17 DIAGNOSIS — I10 Essential (primary) hypertension: Secondary | ICD-10-CM

## 2023-09-17 LAB — POCT URINE DIPSTICK
Bilirubin, UA: NEGATIVE
Blood, UA: NEGATIVE
Glucose, UA: NEGATIVE mg/dL
Ketones, POC UA: NEGATIVE mg/dL
Leukocytes, UA: NEGATIVE
Nitrite, UA: NEGATIVE
POC PROTEIN,UA: NEGATIVE
Spec Grav, UA: 1.01 (ref 1.010–1.025)
Urobilinogen, UA: 0.2 U/dL
pH, UA: 5.5 (ref 5.0–8.0)

## 2023-09-17 MED ORDER — TAMSULOSIN HCL 0.4 MG PO CAPS
0.4000 mg | ORAL_CAPSULE | Freq: Every day | ORAL | 0 refills | Status: AC
  Filled 2023-09-17: qty 14, 14d supply, fill #0

## 2023-09-17 MED ORDER — NAPROXEN 500 MG PO TABS
500.0000 mg | ORAL_TABLET | Freq: Two times a day (BID) | ORAL | 0 refills | Status: AC | PRN
  Filled 2023-09-17: qty 20, 10d supply, fill #0

## 2023-09-17 MED ORDER — NAPROXEN 500 MG PO TABS
500.0000 mg | ORAL_TABLET | Freq: Two times a day (BID) | ORAL | 0 refills | Status: DC | PRN
  Filled 2023-09-17: qty 20, 10d supply, fill #0

## 2023-09-17 MED ORDER — TIZANIDINE HCL 4 MG PO CAPS: 4.0000 mg | ORAL_CAPSULE | Freq: Three times a day (TID) | ORAL | 0 refills | Status: AC | PRN

## 2023-09-17 MED ORDER — TAMSULOSIN HCL 0.4 MG PO CAPS
0.4000 mg | ORAL_CAPSULE | Freq: Every day | ORAL | 0 refills | Status: DC
  Filled 2023-09-17: qty 14, 14d supply, fill #0

## 2023-09-17 NOTE — Discharge Instructions (Signed)
 Follow-up with your primary care provider soon as possible for recheck of your symptoms.  Go to the emergency department if severe worsening symptoms at any time.  We are treating with Flomax  and anti-inflammatory pain medication for a possible kidney stone as well as a muscle relaxer in case your pain is more muscular in nature.

## 2023-09-17 NOTE — ED Provider Notes (Signed)
 [facility]    CSN: [id number] Arrival date & time: 09/17/23  9047      History   Chief Complaint Chief Complaint  Patient presents with   Back Pain    Having back spasms - Entered by patient    HPI Amy Hunter is a 63 y.o. female.   Patient presenting today with about a week of left sided lower back pain radiating to the front of abdomen across flank, nausea, occasional dysuria and some spotting with wiping after urination.  Denies fever, chills, vomiting, bowel changes, injury to the area, new foods or medications, new dietary or lifestyle changes.  Trying Tylenol , ibuprofen, IcyHot, Biofreeze with mild temporary benefit.  History of back spasms.    Past Medical History:  Diagnosis Date   Allergy    Anxiety    Arthritis    Complication of anesthesia    Depression    Diabetes mellitus    type 2   Family history of adverse reaction to anesthesia    mother got n/v   Hypertension    Hypothyroidism    PONV (postoperative nausea and vomiting)    Sleep apnea    no cpap does not tolerate     Patient Active Problem List   Diagnosis Date Noted   Varicose veins of both lower extremities 07/09/2023   Diabetic peripheral neuropathy associated with type 2 diabetes mellitus (HCC) 11/17/2022   Hyperlipidemia associated with type 2 diabetes mellitus (HCC) 07/03/2020   Atopic dermatitis 01/03/2020   High risk medication use 01/03/2020   Essential hypertension 01/03/2020   Fatty liver 11/22/2018   Gout 11/04/2018   Elevated liver enzymes 11/04/2018   Type 2 diabetes mellitus without complication (HCC) 08/17/2015   Hyperlipidemia 11/10/2012   Hypothyroidism 11/10/2012   Obstructive sleep apnea 05/03/2012   Dyspnea on exertion 03/30/2012   Morbid obesity (HCC) 03/30/2012   Venous insufficiency 09/16/2011    Past Surgical History:  Procedure Laterality Date   CHOLECYSTECTOMY     HERNIA REPAIR     umbilical  Dr. Rubin 07-07-17   UMBILICAL HERNIA REPAIR  N/A 07/07/2017   Procedure: OPEN UMBILICAL HERNIA REPAIR WITH MESH;  Surgeon: Rubin Calamity, MD;  Location: [facility];  Service: General;  Laterality: N/A;   uterine ablation     VARICOSE VEIN SURGERY Bilateral    laser treatment--ligation    OB History     Gravida  1   Para  1   Term  1   Preterm      AB      Living  1      SAB      IAB      Ectopic      Multiple      Live Births               Home Medications    Prior to Admission medications   Medication Sig Start Date End Date Taking? Authorizing Provider  tiZANidine  (ZANAFLEX ) 4 MG capsule Take 1 capsule (4 mg total) by mouth 3 (three) times daily as needed for muscle spasms. Do not drink alcohol or drive while taking this medication.  May cause drowsiness. 09/17/23  Yes Stuart Vernell Norris, PA-C  allopurinol  (ZYLOPRIM ) 300 MG tablet Take 1 tablet (300 mg total) by mouth daily. 07/23/23   Alphonsa Glendia LABOR, MD  ALPRAZolam  (XANAX ) 0.5 MG tablet Use 1 tablet twice a day if needed for anxiety (caution drowsiness) 03/24/22   Alphonsa, Glendia LABOR, MD  amLODipine  (NORVASC ) 5 MG tablet Take 1 tablet (5 mg total) by mouth daily. 12/17/22   Alphonsa Glendia LABOR, MD  azelastine  (ASTELIN ) 0.1 % nasal spray Place 2 sprays into both nostrils 2 (two) times daily. 02/19/22   Alphonsa Glendia LABOR, MD  clobetasol  cream (TEMOVATE ) 0.05 % Apply thin amount twice daily as needed to psoriasis patches but not for the groin or face 11/08/22   Alphonsa Glendia LABOR, MD  colchicine  0.6 MG tablet Take 1 tablet (0.6 mg total) by mouth 2 (two) times daily as needed for flare-ups. 04/27/23   Alphonsa Glendia LABOR, MD  colchicine  0.6 MG tablet Take 1 tablet (0.6 mg total) by mouth 2 (two) times daily as needed for flareups 06/30/23   Alphonsa Glendia LABOR, MD  Cyanocobalamin (VITAMIN B-12) 2000 MCG TBCR Take 2,000 mcg by mouth daily.     [provider]  doxycycline  (VIBRAMYCIN ) 100 MG capsule Take 1 capsule (100 mg total) by mouth 2 (two) times daily. 09/07/23   Mauro Elveria BROCKS, NP  Dulaglutide  (TRULICITY ) 4.5 MG/0.5ML SOAJ Inject 4.5 mg as directed once a week. 06/30/23   Alphonsa Glendia LABOR, MD  fluticasone  (FLONASE ) 50 MCG/ACT nasal spray Place 2 sprays into each nostril daily 10/10/21   Luking, Glendia LABOR, MD  gabapentin  (NEURONTIN ) 100 MG capsule Take 1 capsule (100 mg total) by mouth 3 (three) times daily. 03/09/23   Grooms, Courtney, PA-C  ketoconazole  (NIZORAL ) 2 % cream Apply 1 Application topically daily. 08/07/21   Ameduite, Leonna S, FNP  levothyroxine  (SYNTHROID ) 200 MCG tablet Take 1 tablet (200 mg) by mouth daily except on Mondays take 1.5 tablets. 07/23/23   Alphonsa Glendia LABOR, MD  lisinopril  (ZESTRIL ) 2.5 MG tablet Take 1 tablet (2.5 mg total) by mouth every morning. 07/23/23   Alphonsa Glendia LABOR, MD  loratadine  (CLARITIN ) 10 MG tablet Take 1 tablet (10 mg total) by mouth daily. 02/19/22   Alphonsa Glendia LABOR, MD  metFORMIN  (GLUCOPHAGE ) 500 MG tablet Take 0.5 tablets (250 mg total) by mouth 2 (two) times daily. 07/23/23   Alphonsa Glendia LABOR, MD  metoprolol  succinate (TOPROL -XL) 50 MG 24 hr tablet Take 1 tablet (50 mg total) by mouth daily. Take with or immediately following a meal 07/23/23   Luking, Glendia LABOR, MD  naproxen  (NAPROSYN ) 500 MG tablet Take 1 tablet (500 mg total) by mouth 2 (two) times daily with a meal. Please complete labwork that was ordered 05/26/23   Alphonsa Glendia LABOR, MD  naproxen  (NAPROSYN ) 500 MG tablet Take 1 tablet (500 mg total) by mouth 2 (two) times daily as needed. 09/17/23   Stuart Vernell Norris, PA-C  neomycin -bacitracin -polymyxin (NEOSPORIN) ointment Apply 1 application topically every 12 (twelve) hours. 02/07/21   Ameduite, Leonna S, FNP  potassium chloride  SA (KLOR-CON  M) 20 MEQ tablet Take 1 tablet (20 mEq total) by mouth 2 (two) times daily. 12/02/22   Alphonsa Glendia LABOR, MD  promethazine -dextromethorphan (PROMETHAZINE -DM) 6.25-15 MG/5ML syrup Take 5 mLs by mouth 4 (four) times daily as needed for cough. 05/18/23   Grooms, Courtney, PA-C  rosuvastatin   (CRESTOR ) 10 MG tablet Take 1 tablet (10 mg total) by mouth daily. 04/02/23   Alphonsa Glendia LABOR, MD  tamsulosin  (FLOMAX ) 0.4 MG CAPS capsule Take 1 capsule (0.4 mg total) by mouth daily. 09/17/23   Stuart Vernell Norris, PA-C  torsemide  (DEMADEX ) 20 MG tablet Take 3 tablets (60 mg total) by mouth every morning AND 3 tablets (60 mg total) every evening. 02/19/23   Cook, Jayce G, DO  Family History Family History  Problem Relation Age of Onset   Diabetes Mother    Hypertension Mother    Heart disease Mother    Breast cancer Mother    Lung cancer Mother    Heart disease Brother    Heart disease Father    Lung cancer Father    Brain cancer Father    Asthma Father    Lung cancer Maternal Grandmother    Colon cancer Paternal Grandmother     Social History Social History   Tobacco Use   Smoking status: Former    Current packs/day: 0.00    Average packs/day: 0.5 packs/day for 15.0 years (7.5 ttl pk-yrs)    Types: Cigarettes    Start date: 02/04/1995    Quit date: 02/03/2010    Years since quitting: 13.6   Smokeless tobacco: Never   Tobacco comments:    pt reports she smoked off and on for the entire smoking years.   Vaping Use   Vaping status: Never Used  Substance Use Topics   Alcohol use: No   Drug use: No     Allergies   Benadryl [diphenhydramine hcl], Indomethacin , and Keflex  [cephalexin ]   Review of Systems Review of Systems Per HPI  Physical Exam Triage Vital Signs ED Triage Vitals [09/17/23 1000]  Encounter Vitals Group     BP 137/84     Girls Systolic BP Percentile      Girls Diastolic BP Percentile      Boys Systolic BP Percentile      Boys Diastolic BP Percentile      Pulse Rate 88     Resp 20     Temp 97.7 F (36.5 C)     Temp Source Oral     SpO2 92 %     Weight      Height      Head Circumference      Peak Flow      Pain Score 5     Pain Loc      Pain Education      Exclude from Growth Chart    No data found.  Updated Vital Signs BP  137/84 (BP Location: Right Arm)   Pulse 88   Temp 97.7 F (36.5 C) (Oral)   Resp 20   SpO2 92%   Visual Acuity Right Eye Distance:   Left Eye Distance:   Bilateral Distance:    Right Eye Near:   Left Eye Near:    Bilateral Near:     Physical Exam Vitals and nursing note reviewed.  Constitutional:      Appearance: Normal appearance. She is not ill-appearing.  HENT:     Head: Atraumatic.  Eyes:     Extraocular Movements: Extraocular movements intact.     Conjunctiva/sclera: Conjunctivae normal.  Cardiovascular:     Rate and Rhythm: Normal rate and regular rhythm.     Heart sounds: Normal heart sounds.  Pulmonary:     Effort: Pulmonary effort is normal.     Breath sounds: Normal breath sounds.  Abdominal:     General: Bowel sounds are normal. There is no distension.     Palpations: Abdomen is soft.     Tenderness: There is abdominal tenderness. There is no right CVA tenderness, left CVA tenderness or guarding.     Comments: Left lower quadrant tender to palpation extending to left flank and low back.  Bowel sounds equal throughout, no distention or guarding  Musculoskeletal:  General: Tenderness present. Normal range of motion.     Cervical back: Normal range of motion and neck supple.     Comments: No midline spinal tenderness to palpation diffusely.  Left lumbar musculature tender to palpation.  Range of motion grossly intact.  Negative straight leg raise bilateral lower extremities.  Skin:    General: Skin is warm and dry.     Findings: No erythema or rash.  Neurological:     Mental Status: She is alert and oriented to person, place, and time.     Comments: Bilateral lower extremities neurovascularly intact  Psychiatric:        Mood and Affect: Mood normal.        Thought Content: Thought content normal.        Judgment: Judgment normal.      UC Treatments / Results  Labs (all labs ordered are listed, but only abnormal results are displayed) Labs  Reviewed  POCT URINE DIPSTICK - Abnormal; Notable for the following components:      Result Value   Color, UA light yellow (*)    All other components within normal limits    EKG   Radiology No results found.  Procedures Procedures (including critical care time)  Medications Ordered in UC Medications - No data to display  Initial Impression / Assessment and Plan / UC Course  I have reviewed the triage vital signs and the nursing notes.  Pertinent labs & imaging results that were available during my care of the patient were reviewed by me and considered in my medical decision making (see chart for details).     Urinalysis today without evidence of urinary tract infection or RBCs.  Unclear etiology at this time, possibly muscular versus kidney stone.  Trial naproxen  which she tolerates well despite having indomethacin  intolerance, Flomax , Zanaflex , heat, soft, muscle rubs.  Close PCP follow-up recommended, ED for worsening symptoms at any time. Final Clinical Impressions(s) / UC Diagnoses   Final diagnoses:  Left flank pain  Muscle spasm of back  Gross hematuria     Discharge Instructions      Follow-up with your primary care provider soon as possible for recheck of your symptoms.  Go to the emergency department if severe worsening symptoms at any time.  We are treating with Flomax  and anti-inflammatory pain medication for a possible kidney stone as well as a muscle relaxer in case your pain is more muscular in nature.    ED Prescriptions     Medication Sig Dispense Auth. Provider   tamsulosin  (FLOMAX ) 0.4 MG CAPS capsule  (Status: Discontinued) Take 1 capsule (0.4 mg total) by mouth daily. 14 capsule Stuart Vernell Norris, PA-C   naproxen  (NAPROSYN ) 500 MG tablet  (Status: Discontinued) Take 1 tablet (500 mg total) by mouth 2 (two) times daily as needed. 20 tablet Stuart Vernell Norris, PA-C   naproxen  (NAPROSYN ) 500 MG tablet Take 1 tablet (500 mg total) by mouth 2  (two) times daily as needed. 20 tablet Stuart Vernell Norris, PA-C   tamsulosin  (FLOMAX ) 0.4 MG CAPS capsule Take 1 capsule (0.4 mg total) by mouth daily. 14 capsule Stuart Vernell Norris, PA-C   tiZANidine  (ZANAFLEX ) 4 MG capsule Take 1 capsule (4 mg total) by mouth 3 (three) times daily as needed for muscle spasms. Do not drink alcohol or drive while taking this medication.  May cause drowsiness. 15 capsule Stuart Vernell Norris, [place]      PDMP not reviewed this encounter.   Stuart Vernell Norris,  PA-C 09/17/23 1103

## 2023-09-17 NOTE — ED Triage Notes (Addendum)
 Pt reports left lower back pain that radiates to front of abdomen, dysuria,hematuria, nausea x1 week. Denies any known injury or fever. Has tried ibuprofen,tylenol , icy hot, biofreeze with no change in spasms.

## 2023-09-18 ENCOUNTER — Other Ambulatory Visit (HOSPITAL_COMMUNITY): Payer: Self-pay

## 2023-09-18 ENCOUNTER — Other Ambulatory Visit: Payer: Self-pay

## 2023-09-18 DIAGNOSIS — I10 Essential (primary) hypertension: Secondary | ICD-10-CM

## 2023-09-18 MED ORDER — POTASSIUM CHLORIDE CRYS ER 20 MEQ PO TBCR
20.0000 meq | EXTENDED_RELEASE_TABLET | Freq: Two times a day (BID) | ORAL | 1 refills | Status: AC
  Filled 2023-09-18 – 2023-09-22 (×2): qty 180, 90d supply, fill #0

## 2023-09-18 MED ORDER — TRULICITY 4.5 MG/0.5ML ~~LOC~~ SOAJ
4.5000 mg | SUBCUTANEOUS | 2 refills | Status: DC
  Filled 2023-09-18 – 2023-09-22 (×2): qty 2, 28d supply, fill #0
  Filled 2023-10-24: qty 2, 28d supply, fill #1
  Filled 2023-11-20: qty 2, 28d supply, fill #2

## 2023-09-18 MED ORDER — COLCHICINE 0.6 MG PO TABS
0.6000 mg | ORAL_TABLET | Freq: Two times a day (BID) | ORAL | 0 refills | Status: DC | PRN
  Filled 2023-09-18 – 2023-09-22 (×2): qty 60, 30d supply, fill #0

## 2023-09-21 NOTE — Telephone Encounter (Signed)
 noted

## 2023-09-22 ENCOUNTER — Other Ambulatory Visit: Payer: Self-pay

## 2023-09-22 ENCOUNTER — Other Ambulatory Visit (HOSPITAL_COMMUNITY): Payer: Self-pay

## 2023-09-30 ENCOUNTER — Other Ambulatory Visit: Payer: Self-pay | Admitting: Nurse Practitioner

## 2023-09-30 ENCOUNTER — Encounter: Payer: Self-pay | Admitting: Nurse Practitioner

## 2023-09-30 MED ORDER — DOXYCYCLINE HYCLATE 100 MG PO CAPS: 100.0000 mg | ORAL_CAPSULE | Freq: Two times a day (BID) | ORAL | 0 refills | Status: DC

## 2023-10-05 ENCOUNTER — Other Ambulatory Visit: Payer: Self-pay | Admitting: Family Medicine

## 2023-10-05 DIAGNOSIS — I10 Essential (primary) hypertension: Secondary | ICD-10-CM

## 2023-10-06 ENCOUNTER — Other Ambulatory Visit: Payer: Self-pay

## 2023-10-06 ENCOUNTER — Other Ambulatory Visit (HOSPITAL_COMMUNITY): Payer: Self-pay

## 2023-10-06 DIAGNOSIS — I10 Essential (primary) hypertension: Secondary | ICD-10-CM

## 2023-10-06 MED ORDER — TORSEMIDE 20 MG PO TABS
ORAL_TABLET | ORAL | 1 refills | Status: AC
  Filled 2023-10-06 – 2023-10-08 (×2): qty 540, 90d supply, fill #0
  Filled 2024-02-03: qty 540, 90d supply, fill #1

## 2023-10-08 ENCOUNTER — Other Ambulatory Visit (HOSPITAL_COMMUNITY): Payer: Self-pay

## 2023-10-25 ENCOUNTER — Other Ambulatory Visit (HOSPITAL_COMMUNITY): Payer: Self-pay

## 2023-11-20 ENCOUNTER — Other Ambulatory Visit: Payer: Self-pay | Admitting: Family Medicine

## 2023-11-20 ENCOUNTER — Other Ambulatory Visit (HOSPITAL_COMMUNITY): Payer: Self-pay

## 2023-11-20 ENCOUNTER — Other Ambulatory Visit: Payer: Self-pay

## 2023-11-23 ENCOUNTER — Other Ambulatory Visit (HOSPITAL_COMMUNITY): Payer: Self-pay

## 2023-11-23 ENCOUNTER — Other Ambulatory Visit: Payer: Self-pay

## 2023-11-23 ENCOUNTER — Ambulatory Visit (HOSPITAL_COMMUNITY)
Admission: RE | Admit: 2023-11-23 | Discharge: 2023-11-23 | Disposition: A | Discharge status: Home / Self Care | Source: Ambulatory Visit | Attending: Physician Assistant | Admitting: Physician Assistant

## 2023-11-23 ENCOUNTER — Encounter: Payer: Self-pay | Admitting: Physician Assistant

## 2023-11-23 ENCOUNTER — Ambulatory Visit: Payer: Self-pay | Admitting: Physician Assistant

## 2023-11-23 VITALS — BP 138/86 | HR 82 | Temp 97.7°F | Ht 60.0 in | Wt 261.0 lb

## 2023-11-23 DIAGNOSIS — L732 Hidradenitis suppurativa: Secondary | ICD-10-CM

## 2023-11-23 DIAGNOSIS — R221 Localized swelling, mass and lump, neck: Secondary | ICD-10-CM | POA: Diagnosis not present

## 2023-11-23 DIAGNOSIS — M19011 Primary osteoarthritis, right shoulder: Secondary | ICD-10-CM | POA: Diagnosis not present

## 2023-11-23 MED ORDER — COLCHICINE 0.6 MG PO TABS
0.6000 mg | ORAL_TABLET | Freq: Two times a day (BID) | ORAL | 0 refills | Status: AC | PRN
  Filled 2023-11-23: qty 60, 30d supply, fill #0

## 2023-11-23 MED ORDER — DOXYCYCLINE HYCLATE 100 MG PO TABS: 100.0000 mg | ORAL_TABLET | Freq: Two times a day (BID) | ORAL | 0 refills | Status: AC

## 2023-11-23 NOTE — Assessment & Plan Note (Signed)
 Recurrent painful boils under arms, consistent with hidradenitis suppurativa. Current boil under left arm present for a week. Previous antibiotics partially effective. - Prescribe antibiotic for current boil under left arm. Monitor for worsening symptoms such as fever, myalgias, increase size, or drainage.  - Provided information on hidradenitis suppurativa and management strategies.

## 2023-11-23 NOTE — Progress Notes (Signed)
 Acute Office Visit  Subjective:     Patient ID: Amy Hunter, female    DOB: 07-22-1960, 63 y.o.   MRN: [id number]   Discussed the use of AI scribe software for clinical note transcription with the patient, who gave verbal consent to proceed.  History of Present Illness Amy Hunter is a 63 year old female who presents with a nodule on the right side of her neck.  A nodule on the right side of her neck, near the clavicle, appeared about a week ago. It is non-painful with no associated symptoms such as fever or recent illness.  She also has a painful area under her left arm, which started around the same time. She is prone to boils in this area and suspects it might be an abscess or boil. An antibiotic in August for a similar issue provided some relief.  No fever, recent illnesses, chest pain, heart palpitations, hot flashes, or other new symptoms. She is currently taking Synthroid  and is doing well with it. She works at the hospital across the street.   Review of Systems  Constitutional:  Negative for chills, fever and malaise/fatigue.  Respiratory:  Negative for cough and shortness of breath.   Cardiovascular:  Negative for chest pain and palpitations.  Gastrointestinal:  Negative for nausea and vomiting.  Musculoskeletal:  Negative for myalgias.  Neurological:  Negative for dizziness, tremors and headaches.        Objective:     BP 138/86   Pulse 82   Temp 97.7 F (36.5 C)   Ht 5' (1.524 m)   Wt 261 lb (118.4 kg)   SpO2 100%   BMI 50.97 kg/m   Physical Exam Constitutional:      General: She is not in acute distress.    Appearance: Normal appearance. She is obese. She is not ill-appearing.  HENT:     Head: Normocephalic and atraumatic.     Mouth/Throat:     Mouth: Mucous membranes are moist.     Pharynx: Oropharynx is clear.  Eyes:     Extraocular Movements: Extraocular movements intact.     Conjunctiva/sclera: Conjunctivae normal.  Neck:      Thyroid : No thyroid  mass, thyromegaly or thyroid  tenderness.   Cardiovascular:     Rate and Rhythm: Normal rate and regular rhythm.     Heart sounds: Normal heart sounds. No murmur heard. Pulmonary:     Effort: Pulmonary effort is normal.     Breath sounds: Normal breath sounds.  Musculoskeletal:     Cervical back: Normal range of motion and neck supple. No edema or erythema.  Lymphadenopathy:     Cervical: No cervical adenopathy.  Skin:    General: Skin is warm and dry.  Neurological:     General: No focal deficit present.     Mental Status: She is alert and oriented to person, place, and time.  Psychiatric:        Mood and Affect: Mood normal.        Behavior: Behavior normal.     No results found for any visits on 11/23/23.      Assessment & Plan:  Neck nodule Assessment & Plan: Asymptomatic nodule near clavicle with unclear etiology. No immediate concern for malignancy or infection. Differential includes potential bony overgrowth vs soft tissue etiology. - Right clavicle x-ray to evaluate for bony overgrowth or abnormalities. - Advise monitoring for changes in size, skin color, swelling, or associated symptoms. - Recommend ibuprofen or acetaminophen  for  discomfort. - Instruct to return if symptoms worsen or new symptoms develop.  Orders: -     DG Clavicle Right  Hidradenitis suppurativa of left axilla Assessment & Plan: Recurrent painful boils under arms, consistent with hidradenitis suppurativa. Current boil under left arm present for a week. Previous antibiotics partially effective. - Prescribe antibiotic for current boil under left arm. Monitor for worsening symptoms such as fever, myalgias, increase size, or drainage.  - Provided information on hidradenitis suppurativa and management strategies.  Orders: -     Doxycycline  Hyclate; Take 1 tablet (100 mg total) by mouth 2 (two) times daily for 7 days.  Dispense: 14 tablet; Refill: 0    Return if symptoms worsen  or fail to improve.  Charmaine Malaisha Silliman, PA-C

## 2023-11-23 NOTE — Assessment & Plan Note (Signed)
 Asymptomatic nodule near clavicle with unclear etiology. No immediate concern for malignancy or infection. Differential includes potential bony overgrowth vs soft tissue etiology. - Right clavicle x-ray to evaluate for bony overgrowth or abnormalities. - Advise monitoring for changes in size, skin color, swelling, or associated symptoms. - Recommend ibuprofen or acetaminophen  for discomfort. - Instruct to return if symptoms worsen or new symptoms develop.

## 2023-11-24 ENCOUNTER — Ambulatory Visit: Payer: Self-pay | Admitting: Physician Assistant

## 2023-12-17 ENCOUNTER — Other Ambulatory Visit: Payer: Self-pay | Admitting: Family Medicine

## 2023-12-17 ENCOUNTER — Other Ambulatory Visit: Payer: Self-pay

## 2023-12-17 ENCOUNTER — Other Ambulatory Visit (HOSPITAL_COMMUNITY): Payer: Self-pay

## 2023-12-17 MED ORDER — TRULICITY 4.5 MG/0.5ML ~~LOC~~ SOAJ
4.5000 mg | SUBCUTANEOUS | 2 refills | Status: AC
  Filled 2023-12-17: qty 2, 28d supply, fill #0
  Filled 2024-01-21: qty 2, 28d supply, fill #1
  Filled 2024-02-18: qty 2, 28d supply, fill #2

## 2024-01-21 ENCOUNTER — Other Ambulatory Visit: Payer: Self-pay

## 2024-02-05 ENCOUNTER — Other Ambulatory Visit (HOSPITAL_COMMUNITY): Payer: Self-pay

## 2024-02-14 ENCOUNTER — Other Ambulatory Visit: Payer: Self-pay | Admitting: Family Medicine

## 2024-02-15 ENCOUNTER — Other Ambulatory Visit: Payer: Self-pay

## 2024-02-15 MED ORDER — COLCHICINE 0.6 MG PO TABS
0.6000 mg | ORAL_TABLET | Freq: Two times a day (BID) | ORAL | 0 refills | Status: AC | PRN
  Filled 2024-02-15: qty 60, 30d supply, fill #0

## 2024-02-18 ENCOUNTER — Other Ambulatory Visit: Payer: Self-pay

## 2024-03-09 ENCOUNTER — Ambulatory Visit: Admitting: Family Medicine

## 2024-03-09 VITALS — BP 144/85 | HR 87 | Temp 98.1°F | Ht 60.0 in | Wt 256.8 lb

## 2024-03-09 DIAGNOSIS — E1169 Type 2 diabetes mellitus with other specified complication: Secondary | ICD-10-CM

## 2024-03-09 DIAGNOSIS — Z7984 Long term (current) use of oral hypoglycemic drugs: Secondary | ICD-10-CM | POA: Diagnosis not present

## 2024-03-09 DIAGNOSIS — E119 Type 2 diabetes mellitus without complications: Secondary | ICD-10-CM

## 2024-03-09 DIAGNOSIS — E039 Hypothyroidism, unspecified: Secondary | ICD-10-CM | POA: Diagnosis not present

## 2024-03-09 DIAGNOSIS — Z7985 Long-term (current) use of injectable non-insulin antidiabetic drugs: Secondary | ICD-10-CM

## 2024-03-09 DIAGNOSIS — M109 Gout, unspecified: Secondary | ICD-10-CM

## 2024-03-09 DIAGNOSIS — E785 Hyperlipidemia, unspecified: Secondary | ICD-10-CM

## 2024-03-09 DIAGNOSIS — R748 Abnormal levels of other serum enzymes: Secondary | ICD-10-CM

## 2024-03-09 DIAGNOSIS — I10 Essential (primary) hypertension: Secondary | ICD-10-CM | POA: Diagnosis not present

## 2024-03-09 NOTE — Progress Notes (Signed)
 "  Subjective:    Patient ID: Amy Hunter, female    DOB: March 11, 1960, 64 y.o.   MRN: [id number]  HPI  Room 6  Pt is here for medication refills  Discussed the use of AI scribe software for clinical note transcription with the patient, who gave verbal consent to proceed.  History of Present Illness   Amy Hunter is a 64 year old female with diabetes who presents for a routine follow-up visit.  She feels good overall but has noticed a weight gain of about three pounds, which she attributes to being less active and snacking more at home. Despite this, she remains optimistic about her weight loss journey. She is currently on Trulicity , which she feels has helped reduce her appetite and portion sizes. She often shares meals with her sister when dining out, contributing to her weight management. She avoids junk food and fast food, opting for home-cooked meals instead. Occasionally, she bakes but does not keep snacks like chips or cookies at home.  Her daily routine includes walking two to three times a day, which she describes as a lifestyle change that has helped her feel less pressure on her joints. She mentions that a year and a half ago, she would not have been able to walk as much as she does now.  She is consistent with her medications, including amlodipine , rosuvastatin , allopurinol , thyroid  medication, lisinopril , metformin , metoprolol , a daily fluid pill, potassium, and weekly Trulicity . She no longer takes gabapentin  due to gastrointestinal side effects, specifically constipation. She rarely uses 'nerve pills', only taking them occasionally for stress, and does not currently need them. She uses a probation officer that delivers her medications, which she finds convenient.  She has not seen an eye doctor in about a year and a half and plans to schedule an appointment soon. She reports she does not have numbness or burning in her feet and no longer takes gabapentin  due to  gastrointestinal side effects.  Her work is physically demanding, and she has recently taken a forced vacation due to accumulated time off. She describes her work environment as busy, especially during bad weather, and notes changes in work ethic among younger generations.  Her diet includes a protein shake or boiled eggs for breakfast, a heavier lunch around 3:30 PM, and a light dinner after work. She drinks diet [facility], sugar-free lemonade, and water, and is mindful of portion sizes.      Pt states she has a rash under her breasts and have a mammo coming up  Review of Systems     Objective:   Physical Exam General-in no acute distress Eyes-no discharge Lungs-respiratory rate normal, CTA CV-no murmurs,RRR Extremities skin warm dry no edema Neuro grossly normal Behavior normal, alert        Assessment & Plan:  Type 2 diabetes mellitus Managed with Trulicity  and metformin . Reports decreased appetite and weight loss. Gabapentin  discontinued due to side effects. - Continue Trulicity  weekly. - Continue metformin . - Encouraged regular eye exams annually. - Ordered lab work including urine test for proteinuria. Check lab work Morbid obesity Significant weight loss achieved. Current weight 310 pounds, down from 320 pounds in July 2021. Trulicity  aids in appetite reduction. - Continue dietary changes and physical activity. - Set goal to lose 50 more pounds over the next year.  Essential hypertension Well-controlled with amlodipine , lisinopril , and metoprolol . Blood pressure 118/68 mmHg. - Continue amlodipine , lisinopril , and metoprolol . Check lab work Hypothyroidism Managed with thyroid  medication. - Continue  thyroid  medication. Check lab work Gout Managed with allopurinol . - Continue allopurinol . Check lab work General health maintenance Discussed importance of regular eye exams and lifestyle modifications for health improvement. - Schedule annual eye exam. -  Continue lifestyle modifications for weight management.  "

## 2024-09-08 ENCOUNTER — Ambulatory Visit: Admitting: Family Medicine
